# Patient Record
Sex: Female | Born: 1935 | Race: White | Hispanic: No | Marital: Married | Smoking: Former smoker
Health system: Southern US, Community
[De-identification: ages and names within clinical notes are randomized; demographics above are authoritative.]

## PROBLEM LIST (undated history)

## (undated) DIAGNOSIS — Z87891 Personal history of nicotine dependence: Secondary | ICD-10-CM

## (undated) DIAGNOSIS — J449 Chronic obstructive pulmonary disease, unspecified: Secondary | ICD-10-CM

## (undated) DIAGNOSIS — E785 Hyperlipidemia, unspecified: Secondary | ICD-10-CM

## (undated) DIAGNOSIS — R269 Unspecified abnormalities of gait and mobility: Secondary | ICD-10-CM

## (undated) DIAGNOSIS — R413 Other amnesia: Principal | ICD-10-CM

## (undated) DIAGNOSIS — W19XXXA Unspecified fall, initial encounter: Secondary | ICD-10-CM

## (undated) DIAGNOSIS — R296 Repeated falls: Secondary | ICD-10-CM

## (undated) DIAGNOSIS — H409 Unspecified glaucoma: Secondary | ICD-10-CM

## (undated) DIAGNOSIS — I10 Essential (primary) hypertension: Secondary | ICD-10-CM

## (undated) DIAGNOSIS — H9190 Unspecified hearing loss, unspecified ear: Secondary | ICD-10-CM

## (undated) DIAGNOSIS — K222 Esophageal obstruction: Secondary | ICD-10-CM

## (undated) HISTORY — DX: Unspecified abnormalities of gait and mobility: R26.9

## (undated) HISTORY — PX: CATARACT EXTRACTION: SUR2

## (undated) HISTORY — DX: Unspecified hearing loss, unspecified ear: H91.90

## (undated) HISTORY — PX: APPENDECTOMY: SHX54

## (undated) HISTORY — DX: Personal history of nicotine dependence: Z87.891

## (undated) HISTORY — DX: Chronic obstructive pulmonary disease, unspecified: J44.9

## (undated) HISTORY — DX: Unspecified fall, initial encounter: W19.XXXA

## (undated) HISTORY — DX: Esophageal obstruction: K22.2

## (undated) HISTORY — DX: Repeated falls: R29.6

## (undated) HISTORY — DX: Unspecified glaucoma: H40.9

## (undated) HISTORY — DX: Other amnesia: R41.3

## (undated) HISTORY — DX: Essential (primary) hypertension: I10

## (undated) HISTORY — PX: TONSILLECTOMY AND ADENOIDECTOMY: SUR1326

## (undated) HISTORY — DX: Hyperlipidemia, unspecified: E78.5

## (undated) HISTORY — PX: OTHER SURGICAL HISTORY: SHX169

---

## 1998-11-07 ENCOUNTER — Ambulatory Visit (HOSPITAL_COMMUNITY): Admission: RE | Admit: 1998-11-07 | Discharge: 1998-11-07 | Payer: Self-pay | Admitting: Obstetrics and Gynecology

## 1998-11-07 ENCOUNTER — Encounter: Payer: Self-pay | Admitting: Obstetrics and Gynecology

## 2001-12-29 ENCOUNTER — Ambulatory Visit (HOSPITAL_COMMUNITY): Admission: RE | Admit: 2001-12-29 | Discharge: 2001-12-29 | Payer: Self-pay | Admitting: *Deleted

## 2001-12-29 ENCOUNTER — Encounter: Payer: Self-pay | Admitting: *Deleted

## 2004-06-07 ENCOUNTER — Encounter: Admission: RE | Admit: 2004-06-07 | Discharge: 2004-06-07 | Payer: Self-pay | Admitting: Obstetrics and Gynecology

## 2004-06-14 ENCOUNTER — Encounter: Admission: RE | Admit: 2004-06-14 | Discharge: 2004-06-14 | Payer: Self-pay | Admitting: Specialist

## 2010-07-31 ENCOUNTER — Ambulatory Visit: Payer: Self-pay | Admitting: Cardiology

## 2011-01-24 ENCOUNTER — Other Ambulatory Visit: Payer: Self-pay | Admitting: Cardiology

## 2011-01-26 ENCOUNTER — Other Ambulatory Visit: Payer: Self-pay | Admitting: Cardiology

## 2011-01-26 MED ORDER — BISOPROLOL-HYDROCHLOROTHIAZIDE 2.5-6.25 MG PO TABS: ORAL_TABLET | ORAL | Status: DC

## 2011-01-26 NOTE — Telephone Encounter (Signed)
Medication dose was verified (Ziac 2.5-6.25mg  daily) and sent to pharmacy

## 2011-05-11 ENCOUNTER — Other Ambulatory Visit: Payer: Self-pay | Admitting: *Deleted

## 2011-05-11 DIAGNOSIS — F419 Anxiety disorder, unspecified: Secondary | ICD-10-CM

## 2011-05-11 NOTE — Telephone Encounter (Signed)
Refilled meds per fax request. Faxed signed Rx back 

## 2011-05-12 MED ORDER — ALPRAZOLAM 0.25 MG PO TABS: 0.2500 mg | ORAL_TABLET | Freq: Two times a day (BID) | ORAL | Status: AC | PRN

## 2011-08-20 ENCOUNTER — Other Ambulatory Visit: Payer: Self-pay | Admitting: Cardiology

## 2011-08-21 NOTE — Telephone Encounter (Signed)
Pt requesting refill for bisoprolol , uses [facility]

## 2012-02-14 ENCOUNTER — Encounter: Payer: Self-pay | Admitting: *Deleted

## 2012-02-14 DIAGNOSIS — Z87891 Personal history of nicotine dependence: Secondary | ICD-10-CM | POA: Insufficient documentation

## 2013-11-21 ENCOUNTER — Encounter (HOSPITAL_COMMUNITY): Payer: Self-pay | Admitting: Emergency Medicine

## 2013-11-21 ENCOUNTER — Emergency Department (HOSPITAL_COMMUNITY): Payer: Medicare Other

## 2013-11-21 ENCOUNTER — Inpatient Hospital Stay (HOSPITAL_COMMUNITY)
Admission: EM | Admit: 2013-11-21 | Discharge: 2013-11-27 | DRG: 208 | Disposition: A | Discharge status: Skilled Nursing Facility | Payer: Medicare Other | Attending: Pulmonary Disease | Admitting: Pulmonary Disease

## 2013-11-21 DIAGNOSIS — G9340 Encephalopathy, unspecified: Secondary | ICD-10-CM | POA: Diagnosis present

## 2013-11-21 DIAGNOSIS — T17308A Unspecified foreign body in larynx causing other injury, initial encounter: Secondary | ICD-10-CM | POA: Diagnosis present

## 2013-11-21 DIAGNOSIS — B9561 Methicillin susceptible Staphylococcus aureus infection as the cause of diseases classified elsewhere: Secondary | ICD-10-CM

## 2013-11-21 DIAGNOSIS — E785 Hyperlipidemia, unspecified: Secondary | ICD-10-CM | POA: Diagnosis present

## 2013-11-21 DIAGNOSIS — J40 Bronchitis, not specified as acute or chronic: Secondary | ICD-10-CM

## 2013-11-21 DIAGNOSIS — R34 Anuria and oliguria: Secondary | ICD-10-CM | POA: Diagnosis present

## 2013-11-21 DIAGNOSIS — I279 Pulmonary heart disease, unspecified: Secondary | ICD-10-CM | POA: Diagnosis present

## 2013-11-21 DIAGNOSIS — Z87891 Personal history of nicotine dependence: Secondary | ICD-10-CM

## 2013-11-21 DIAGNOSIS — J690 Pneumonitis due to inhalation of food and vomit: Principal | ICD-10-CM | POA: Diagnosis present

## 2013-11-21 DIAGNOSIS — R1310 Dysphagia, unspecified: Secondary | ICD-10-CM | POA: Diagnosis present

## 2013-11-21 DIAGNOSIS — E43 Unspecified severe protein-calorie malnutrition: Secondary | ICD-10-CM | POA: Diagnosis present

## 2013-11-21 DIAGNOSIS — R1319 Other dysphagia: Secondary | ICD-10-CM

## 2013-11-21 DIAGNOSIS — T380X5A Adverse effect of glucocorticoids and synthetic analogues, initial encounter: Secondary | ICD-10-CM | POA: Diagnosis present

## 2013-11-21 DIAGNOSIS — J9819 Other pulmonary collapse: Secondary | ICD-10-CM | POA: Diagnosis present

## 2013-11-21 DIAGNOSIS — R7309 Other abnormal glucose: Secondary | ICD-10-CM | POA: Diagnosis present

## 2013-11-21 DIAGNOSIS — J9692 Respiratory failure, unspecified with hypercapnia: Secondary | ICD-10-CM

## 2013-11-21 DIAGNOSIS — J9620 Acute and chronic respiratory failure, unspecified whether with hypoxia or hypercapnia: Secondary | ICD-10-CM | POA: Diagnosis present

## 2013-11-21 DIAGNOSIS — R5383 Other fatigue: Secondary | ICD-10-CM

## 2013-11-21 DIAGNOSIS — W19XXXA Unspecified fall, initial encounter: Secondary | ICD-10-CM

## 2013-11-21 DIAGNOSIS — I10 Essential (primary) hypertension: Secondary | ICD-10-CM | POA: Diagnosis present

## 2013-11-21 DIAGNOSIS — J9690 Respiratory failure, unspecified, unspecified whether with hypoxia or hypercapnia: Secondary | ICD-10-CM | POA: Diagnosis present

## 2013-11-21 DIAGNOSIS — F17200 Nicotine dependence, unspecified, uncomplicated: Secondary | ICD-10-CM | POA: Diagnosis present

## 2013-11-21 DIAGNOSIS — R031 Nonspecific low blood-pressure reading: Secondary | ICD-10-CM | POA: Diagnosis present

## 2013-11-21 DIAGNOSIS — J441 Chronic obstructive pulmonary disease with (acute) exacerbation: Secondary | ICD-10-CM | POA: Diagnosis present

## 2013-11-21 LAB — COMPREHENSIVE METABOLIC PANEL
ALBUMIN: 3.2 g/dL — AB (ref 3.5–5.2)
ALT: 347 U/L — ABNORMAL HIGH (ref 0–35)
AST: 478 U/L — AB (ref 0–37)
Alkaline Phosphatase: 185 U/L — ABNORMAL HIGH (ref 39–117)
BILIRUBIN TOTAL: 1.1 mg/dL (ref 0.3–1.2)
BUN: 61 mg/dL — ABNORMAL HIGH (ref 6–23)
CHLORIDE: 94 meq/L — AB (ref 96–112)
CO2: 32 mEq/L (ref 19–32)
CREATININE: 1.21 mg/dL — AB (ref 0.50–1.10)
Calcium: 8.4 mg/dL (ref 8.4–10.5)
GFR calc Af Amer: 49 mL/min — ABNORMAL LOW (ref >90)
GFR calc non Af Amer: 42 mL/min — ABNORMAL LOW (ref >90)
Glucose, Bld: 110 mg/dL — ABNORMAL HIGH (ref 70–99)
Potassium: 5.6 mEq/L — ABNORMAL HIGH (ref 3.7–5.3)
SODIUM: 140 meq/L (ref 137–147)
Total Protein: 6.5 g/dL (ref 6.0–8.3)

## 2013-11-21 LAB — MAGNESIUM: Magnesium: 2.6 mg/dL — ABNORMAL HIGH (ref 1.5–2.5)

## 2013-11-21 LAB — RAPID URINE DRUG SCREEN, HOSP PERFORMED
Amphetamines: NOT DETECTED
Barbiturates: NOT DETECTED
Benzodiazepines: POSITIVE — AB
Cocaine: NOT DETECTED
Opiates: NOT DETECTED
TETRAHYDROCANNABINOL: NOT DETECTED

## 2013-11-21 LAB — PHOSPHORUS: PHOSPHORUS: 6.8 mg/dL — AB (ref 2.3–4.6)

## 2013-11-21 LAB — I-STAT ARTERIAL BLOOD GAS, ED
ACID-BASE EXCESS: 4 mmol/L — AB (ref 0.0–2.0)
Bicarbonate: 29.8 mEq/L — ABNORMAL HIGH (ref 20.0–24.0)
O2 SAT: 96 %
PCO2 ART: 43.9 mmHg (ref 35.0–45.0)
PO2 ART: 72 mmHg — AB (ref 80.0–100.0)
Patient temperature: 95.9
TCO2: 31 mmol/L (ref 0–100)
pH, Arterial: 7.434 (ref 7.350–7.450)

## 2013-11-21 LAB — BASIC METABOLIC PANEL
BUN: 62 mg/dL — ABNORMAL HIGH (ref 6–23)
CO2: 31 mEq/L (ref 19–32)
Calcium: 8.4 mg/dL (ref 8.4–10.5)
Chloride: 97 mEq/L (ref 96–112)
Creatinine, Ser: 1.05 mg/dL (ref 0.50–1.10)
GFR calc Af Amer: 58 mL/min — ABNORMAL LOW (ref >90)
GFR calc non Af Amer: 50 mL/min — ABNORMAL LOW (ref >90)
Glucose, Bld: 100 mg/dL — ABNORMAL HIGH (ref 70–99)
Potassium: 4.5 mEq/L (ref 3.7–5.3)
Sodium: 141 mEq/L (ref 137–147)

## 2013-11-21 LAB — MRSA PCR SCREENING: MRSA by PCR: NEGATIVE

## 2013-11-21 LAB — CBC
HCT: 45.5 % (ref 36.0–46.0)
Hemoglobin: 14 g/dL (ref 12.0–15.0)
MCH: 30.1 pg (ref 26.0–34.0)
MCHC: 30.8 g/dL (ref 30.0–36.0)
MCV: 97.8 fL (ref 78.0–100.0)
Platelets: 139 10*3/uL — ABNORMAL LOW (ref 150–400)
RBC: 4.65 MIL/uL (ref 3.87–5.11)
RDW: 14.8 % (ref 11.5–15.5)
WBC: 11.8 10*3/uL — AB (ref 4.0–10.5)

## 2013-11-21 LAB — PROTIME-INR
INR: 1.29 (ref 0.00–1.49)
Prothrombin Time: 15.8 seconds — ABNORMAL HIGH (ref 11.6–15.2)

## 2013-11-21 LAB — ETHANOL

## 2013-11-21 LAB — I-STAT CG4 LACTIC ACID, ED: Lactic Acid, Venous: 1.09 mmol/L (ref 0.5–2.2)

## 2013-11-21 LAB — PROCALCITONIN: Procalcitonin: 0.25 ng/mL

## 2013-11-21 LAB — TROPONIN I: Troponin I: <0.3 ng/mL (ref <0.30)

## 2013-11-21 LAB — APTT: aPTT: 31 seconds (ref 24–37)

## 2013-11-21 LAB — TRIGLYCERIDES

## 2013-11-21 LAB — PRO B NATRIURETIC PEPTIDE: Pro B Natriuretic peptide (BNP): 16993 pg/mL — ABNORMAL HIGH (ref 0–450)

## 2013-11-21 LAB — GLUCOSE, CAPILLARY: Glucose-Capillary: 106 mg/dL — ABNORMAL HIGH (ref 70–99)

## 2013-11-21 LAB — LACTIC ACID, PLASMA: Lactic Acid, Venous: 1.2 mmol/L (ref 0.5–2.2)

## 2013-11-21 MED ORDER — NALOXONE HCL 0.4 MG/ML IJ SOLN
0.4000 mg | Freq: Once | INTRAMUSCULAR | Status: AC
  Administered 2013-11-21: 0.4 mg via INTRAVENOUS
  Filled 2013-11-21: qty 1

## 2013-11-21 MED ORDER — IPRATROPIUM-ALBUTEROL 0.5-2.5 (3) MG/3ML IN SOLN
3.0000 mL | RESPIRATORY_TRACT | Status: DC
  Administered 2013-11-21 – 2013-11-25 (×21): 3 mL via RESPIRATORY_TRACT
  Filled 2013-11-21 (×20): qty 3

## 2013-11-21 MED ORDER — FENTANYL CITRATE 0.05 MG/ML IJ SOLN
100.0000 ug | Freq: Once | INTRAMUSCULAR | Status: AC
  Administered 2013-11-21: 100 ug via INTRAVENOUS
  Filled 2013-11-21: qty 2

## 2013-11-21 MED ORDER — MIDAZOLAM HCL 2 MG/2ML IJ SOLN
2.0000 mg | Freq: Once | INTRAMUSCULAR | Status: AC
  Administered 2013-11-21: 1 mg via INTRAVENOUS
  Filled 2013-11-21: qty 2

## 2013-11-21 MED ORDER — ETOMIDATE 2 MG/ML IV SOLN: 0.3000 mg/kg | Freq: Once | INTRAVENOUS | Status: DC

## 2013-11-21 MED ORDER — LIDOCAINE HCL (CARDIAC) 20 MG/ML IV SOLN
INTRAVENOUS | Status: AC
  Filled 2013-11-21: qty 5

## 2013-11-21 MED ORDER — SODIUM CHLORIDE 0.9 % IV SOLN
INTRAVENOUS | Status: DC
  Administered 2013-11-21: 100 mL/h via INTRAVENOUS
  Administered 2013-11-22 – 2013-11-24 (×2): via INTRAVENOUS

## 2013-11-21 MED ORDER — CHLORHEXIDINE GLUCONATE 0.12 % MT SOLN
OROMUCOSAL | Status: AC
  Filled 2013-11-21: qty 15

## 2013-11-21 MED ORDER — SODIUM CHLORIDE 0.9 % IV SOLN
1.5000 g | Freq: Two times a day (BID) | INTRAVENOUS | Status: DC
  Administered 2013-11-22 – 2013-11-23 (×3): 1.5 g via INTRAVENOUS
  Filled 2013-11-21 (×4): qty 1.5

## 2013-11-21 MED ORDER — ROCURONIUM BROMIDE 50 MG/5ML IV SOLN
50.0000 mg | Freq: Once | INTRAVENOUS | Status: AC
  Administered 2013-11-21: 50 mg via INTRAVENOUS

## 2013-11-21 MED ORDER — PROPOFOL 10 MG/ML IV EMUL
INTRAVENOUS | Status: AC
  Filled 2013-11-21: qty 100

## 2013-11-21 MED ORDER — ETOMIDATE 2 MG/ML IV SOLN
INTRAVENOUS | Status: AC
  Filled 2013-11-21: qty 20

## 2013-11-21 MED ORDER — PROPOFOL 10 MG/ML IV EMUL
0.0000 ug/kg/min | INTRAVENOUS | Status: DC
  Administered 2013-11-21: 5 ug/kg/min via INTRAVENOUS

## 2013-11-21 MED ORDER — HYDRALAZINE HCL 20 MG/ML IJ SOLN: 20.0000 mg | INTRAMUSCULAR | Status: DC | PRN

## 2013-11-21 MED ORDER — ETOMIDATE 2 MG/ML IV SOLN
10.0000 mg | Freq: Once | INTRAVENOUS | Status: AC
  Administered 2013-11-21: 10 mg via INTRAVENOUS

## 2013-11-21 MED ORDER — METHYLPREDNISOLONE SODIUM SUCC 40 MG IJ SOLR
40.0000 mg | Freq: Three times a day (TID) | INTRAMUSCULAR | Status: DC
  Administered 2013-11-21 – 2013-11-24 (×8): 40 mg via INTRAVENOUS
  Filled 2013-11-21 (×11): qty 1

## 2013-11-21 MED ORDER — PANTOPRAZOLE SODIUM 40 MG IV SOLR
40.0000 mg | Freq: Every day | INTRAVENOUS | Status: DC
  Administered 2013-11-21 – 2013-11-23 (×3): 40 mg via INTRAVENOUS
  Filled 2013-11-21 (×4): qty 40

## 2013-11-21 MED ORDER — ROCURONIUM BROMIDE 50 MG/5ML IV SOLN: 1.0000 mg/kg | Freq: Once | INTRAVENOUS | Status: DC

## 2013-11-21 MED ORDER — SODIUM CHLORIDE 0.9 % IV SOLN
3.0000 g | Freq: Once | INTRAVENOUS | Status: AC
  Administered 2013-11-21: 3 g via INTRAVENOUS
  Filled 2013-11-21: qty 3

## 2013-11-21 MED ORDER — SUCCINYLCHOLINE CHLORIDE 20 MG/ML IJ SOLN
INTRAMUSCULAR | Status: AC
Start: 2013-11-21 — End: 2013-11-22
  Filled 2013-11-21: qty 1

## 2013-11-21 MED ORDER — ROCURONIUM BROMIDE 50 MG/5ML IV SOLN
INTRAVENOUS | Status: AC
  Filled 2013-11-21: qty 2

## 2013-11-21 MED ORDER — FENTANYL CITRATE 0.05 MG/ML IJ SOLN
50.0000 ug | INTRAMUSCULAR | Status: DC | PRN
  Administered 2013-11-22 (×3): 50 ug via INTRAVENOUS
  Filled 2013-11-21 (×4): qty 2

## 2013-11-21 MED ORDER — PROPOFOL 10 MG/ML IV EMUL
0.0000 ug/kg/min | INTRAVENOUS | Status: DC
  Administered 2013-11-22 – 2013-11-23 (×2): 20 ug/kg/min via INTRAVENOUS
  Filled 2013-11-21 (×4): qty 100

## 2013-11-21 NOTE — Progress Notes (Addendum)
On review of patient labs from the Emergency Department Potassium was 5.6. Lab placed and redrawn. Repeat was WNL 4.5. [facility] Dr. Alphonsus SiasZUBELEVITSKIY Clete Kuch, Quita SkyeJames D

## 2013-11-21 NOTE — ED Notes (Signed)
Pt returned from CT and placed back on cardiac monitor 

## 2013-11-21 NOTE — ED Notes (Addendum)
Lab at the bedside. EDP talking with family at the bedside

## 2013-11-21 NOTE — ED Notes (Addendum)
Pt O2 dropped to 79% on [place]. Placed on NRB and EDP informed, but not to the bedside. Orders for ABG placed. Family at the bedside. Pt more lethargic at this time, but still opens eyes to verbal stimuli. O2 sat heart rate correlates with EKG cardiac lead rhythm and has a good waveform.

## 2013-11-21 NOTE — ED Notes (Signed)
Dr. Radford PaxBeaton at the bedside to intubate pt.

## 2013-11-21 NOTE — ED Provider Notes (Signed)
INTUBATION Performed by: Jon GillsWebb, Gudelia Eugene  Required items: required blood products, implants, devices, and special equipment available Patient identity confirmed: provided demographic data and hospital-assigned identification number Time out: Immediately prior to procedure a "time out" was called to verify the correct patient, procedure, equipment, support staff and site/side marked as required.  Indications: hypercarbic respiratory failure  Intubation method: Glidescope Laryngoscopy   Preoxygenation: NRB  Sedatives: Etomidate Paralytic: Rocuronium  Tube Size: 7.0 mm cuffed  Tube position: 23 cm, at the teeth  Post-procedure assessment: chest rise and ETCO2 monitor Breath sounds: equal and absent over the epigastrium Tube secured with: ETT holder Chest x-ray interpreted by radiologist and me.  Chest x-ray findings: endotracheal tube in appropriate position  Patient tolerated the procedure well with no immediate complications.   Jon GillsZach Brandan Robicheaux, MD 11/21/13 1650

## 2013-11-21 NOTE — ED Notes (Signed)
Pt to department via EMS- husband reports that pt fell on Tuesday on the ice on her face. Husband reports that she is not acting herself and more lethargic but responds to painful stimuli. Bp-80/40 on EMS arrival then given about 100 ns last BP-115/70 Hr-58 CBG-202. Pt with bruising to the eyes, nose and ears.

## 2013-11-21 NOTE — H&P (Signed)
Name: Laurie Horne MRN: [id number] DOB: 1934-04-24    ADMISSION DATE:  11/21/2013   REFERRING MD :  EDP PRIMARY SERVICE: PCCM  CHIEF COMPLAINT:  VDRF  BRIEF PATIENT DESCRIPTION:  80 yo smoker who fell 4 days prior to admit(2/24) and suffered facial and knee abrasions and bruises.  She presents to ER at [facility] with hypercarbic respiratory failure (7.04/>100/274) and was intubated by the EDP. PCCM asked to admit. Currently on ventilator and sedated.  SIGNIFICANT EVENTS / STUDIES:  2/24 fall  LINES / TUBES: 2/28 OTT>>  CULTURES: 2/28 sputum>> 2/28 bc x 2>> 2/28 UC>>  ANTIBIOTICS: unasyn 2/28 >>  HISTORY OF PRESENT ILLNESS:   History obtained from husband x 60 yrs 41 yo smoker who fell 4 days prior to admit(2/24) and suffered facial and knee abrasions and bruises.  Confusion started on am of admission. She presents to ER at [facility] with hypercarbic respiratory failure (7.04/>100/274) and was intubated by the EDP. PCCM asked to admit. Currently on ventilator and sedated.  PAST MEDICAL HISTORY :  Past Medical History  Diagnosis Date  . Hyperlipidemia   . Hypertension   . History of tobacco abuse    Past Surgical History  Procedure Laterality Date  . Tonsillectomy and adenoidectomy    . Appendectomy    . Childbirth      x 3   Prior to Admission medications   Medication Sig Start Date End Date Taking? Authorizing Provider  ALPRAZolam (XANAX) 0.25 MG tablet Take 0.25 mg by mouth at bedtime as needed.   Yes Historical Provider, MD  aspirin 81 MG tablet Take 81 mg by mouth daily.   Yes Historical Provider, MD  bisoprolol-hydrochlorothiazide [facility]) 2.5-6.25 MG per tablet 1 tablet po daily 01/26/11  Yes Cassell Clement, MD  brinzolamide (AZOPT) 1 % ophthalmic suspension 1 drop. As directed   Yes Historical Provider, MD  meclizine (ANTIVERT) 12.5 MG tablet Take 12.5 mg by mouth 3 (three) times daily as needed for dizziness.   Yes Historical Provider, MD  ramipril (ALTACE) 5 MG  tablet Take 5 mg by mouth daily.   Yes Historical Provider, MD  Travoprost, BAK Free, (TRAVATAN) 0.004 % SOLN ophthalmic solution 1 drop. As directed   Yes Historical Provider, MD   Allergies  Allergen Reactions  . Codeine     FAMILY HISTORY:  History reviewed. No pertinent family history. SOCIAL HISTORY:  reports that she has been smoking Cigarettes.  She has been smoking about 0.25 packs per day. She does not have any smokeless tobacco history on file. Her alcohol and drug histories are not on file.  REVIEW OF SYSTEMS:  NA  SUBJECTIVE:   VITAL SIGNS: Temp:  [95.7 F (35.4 C)-96.3 F (35.7 C)] 96.3 F (35.7 C) (02/28 1506) Pulse Rate:  [58-74] 74 (02/28 1544) Resp:  [16-18] 16 (02/28 1544) BP: (100-121)/(49-58) 103/58 mmHg (02/28 1544) SpO2:  [92 %-93 %] 92 % (02/28 1544) HEMODYNAMICS:   VENTILATOR SETTINGS:   INTAKE / OUTPUT: Intake/Output   None     PHYSICAL EXAMINATION: General:  Disheveled wf with multiple facial abrasions   Neuro:  Sedated on vent HEENT:  PERL 4 mm, +JVD, No LAN, 'racoon eyes', old hematoma on forehead Cardiovascular: HSR RRR Lungs:  Decreased bs thru out Abdomen:  + bs Musculoskeletal:  Bilateral knee abrasions,  Facial trauma and racoon eyes Skin:  Poor tugur  LABS:  CBC No results found for this basename: WBC, HGB, HCT, PLT,  in the last 168 hours  Coag's No results found for this basename: APTT, INR,  in the last 168 hours BMET No results found for this basename: NA, K, CL, CO2, BUN, CREATININE, GLUCOSE,  in the last 168 hours Electrolytes No results found for this basename: CALCIUM, MG, PHOS,  in the last 168 hours Sepsis Markers  Recent Labs Lab 11/21/13 1600  LATICACIDVEN 1.09   ABG No results found for this basename: PHART, PCO2ART, PO2ART,  in the last 168 hours Liver Enzymes No results found for this basename: AST, ALT, ALKPHOS, BILITOT, ALBUMIN,  in the last 168 hours Cardiac Enzymes No results found for this  basename: TROPONINI, PROBNP,  in the last 168 hours Glucose No results found for this basename: GLUCAP,  in the last 168 hours  Imaging Ct Head Wo Contrast  11/21/2013   CLINICAL DATA:  Fall on ice  EXAM: CT HEAD WITHOUT CONTRAST  CT MAXILLOFACIAL WITHOUT CONTRAST  CT CERVICAL SPINE WITHOUT CONTRAST  TECHNIQUE: Multidetector CT imaging of the head, cervical spine, and maxillofacial structures were performed using the standard protocol without intravenous contrast. Multiplanar CT image reconstructions of the cervical spine and maxillofacial structures were also generated.  COMPARISON:  06/14/2004  FINDINGS: CT HEAD FINDINGS  No skull fracture is noted. There is scalp swelling in left frontal region. Subcutaneous scalp hematoma measures 1.5 cm in length. Mild atherosclerotic calcifications of carotid siphon.  No intracranial hemorrhage, mass effect or midline shift. Mild cerebral atrophy. Periventricular and patchy subcortical white matter decreased attenuation probable due to chronic small vessel ischemic changes. No acute cortical infarction. No mass lesion is noted on this unenhanced scan.  CT MAXILLOFACIAL FINDINGS  No nasal bone fracture is noted. There is soft tissue swelling right supraorbital and bilateral frontal region. No nasal bone fracture is noted. No zygomatic fracture is identified.  Coronal images shows right nasal septum deviation. Bilateral semilunar canal is patent. No orbital rim or orbital floor fracture. Metallic dental artifacts are noted.  Motion artifacts are noted. No definite mandibular fracture. No definite TMJ dislocation.  No orbital rim or orbital floor fracture. No intraorbital hematoma. Bilateral eye globe appears intact.  Sagittal images shows patent oropharyngeal airway. No maxillary spine fracture is noted.  CT CERVICAL SPINE FINDINGS  Axial images of the cervical spine shows no acute fracture or subluxation. Computer reconstructed images shows no acute fracture or  subluxation. There is disc space flattening with endplate sclerotic changes C3-C4-C4-C5 and C5-C6 level. Mild anterior and mild posterior spurring at C4-C5 and C5-C6 level. Mild spinal canal stenosis due to posterior spurring at C4-C5 and C5-C6 level. No prevertebral soft tissue swelling. Extensive degenerative changes C1-C2 articulation. Mild pannus formation at C1-C2 level.  The nasopharyngeal and cervical airway is patent.  Axial images shows no pneumothorax in visualized lung apices. Bilateral apical scarring. Mild emphysematous changes right apex.  IMPRESSION: 1. No acute intracranial abnormality. There is soft tissue swelling in frontal scalp. Small subcutaneous hematoma in left frontal scalp. Stable atrophy. There is periventricular and patchy subcortical white matter decreased attenuation probable due to chronic small vessel ischemic changes. 2. No facial fractures are noted. There are metallic dental artifact. There are also motion artifact. No intraorbital hematoma. No orbital rim or orbital floor fracture. Mild soft tissue swelling in frontal region and right supraorbital region. Mild soft tissue swelling in left frontal scalp. 3. No zygomatic fracture. No definite mandibular fracture. No TMJ dislocation. 4. No paranasal sinuses air-fluid levels. 5. No cervical spine acute fracture or subluxation. Multilevel degenerative changes as  described above.   Electronically Signed   By: Natasha Mead M.D.   On: 11/21/2013 15:30   Ct Cervical Spine Wo Contrast  11/21/2013   CLINICAL DATA:  Fall on ice  EXAM: CT HEAD WITHOUT CONTRAST  CT MAXILLOFACIAL WITHOUT CONTRAST  CT CERVICAL SPINE WITHOUT CONTRAST  TECHNIQUE: Multidetector CT imaging of the head, cervical spine, and maxillofacial structures were performed using the standard protocol without intravenous contrast. Multiplanar CT image reconstructions of the cervical spine and maxillofacial structures were also generated.  COMPARISON:  06/14/2004  FINDINGS: CT  HEAD FINDINGS  No skull fracture is noted. There is scalp swelling in left frontal region. Subcutaneous scalp hematoma measures 1.5 cm in length. Mild atherosclerotic calcifications of carotid siphon.  No intracranial hemorrhage, mass effect or midline shift. Mild cerebral atrophy. Periventricular and patchy subcortical white matter decreased attenuation probable due to chronic small vessel ischemic changes. No acute cortical infarction. No mass lesion is noted on this unenhanced scan.  CT MAXILLOFACIAL FINDINGS  No nasal bone fracture is noted. There is soft tissue swelling right supraorbital and bilateral frontal region. No nasal bone fracture is noted. No zygomatic fracture is identified.  Coronal images shows right nasal septum deviation. Bilateral semilunar canal is patent. No orbital rim or orbital floor fracture. Metallic dental artifacts are noted.  Motion artifacts are noted. No definite mandibular fracture. No definite TMJ dislocation.  No orbital rim or orbital floor fracture. No intraorbital hematoma. Bilateral eye globe appears intact.  Sagittal images shows patent oropharyngeal airway. No maxillary spine fracture is noted.  CT CERVICAL SPINE FINDINGS  Axial images of the cervical spine shows no acute fracture or subluxation. Computer reconstructed images shows no acute fracture or subluxation. There is disc space flattening with endplate sclerotic changes C3-C4-C4-C5 and C5-C6 level. Mild anterior and mild posterior spurring at C4-C5 and C5-C6 level. Mild spinal canal stenosis due to posterior spurring at C4-C5 and C5-C6 level. No prevertebral soft tissue swelling. Extensive degenerative changes C1-C2 articulation. Mild pannus formation at C1-C2 level.  The nasopharyngeal and cervical airway is patent.  Axial images shows no pneumothorax in visualized lung apices. Bilateral apical scarring. Mild emphysematous changes right apex.  IMPRESSION: 1. No acute intracranial abnormality. There is soft tissue  swelling in frontal scalp. Small subcutaneous hematoma in left frontal scalp. Stable atrophy. There is periventricular and patchy subcortical white matter decreased attenuation probable due to chronic small vessel ischemic changes. 2. No facial fractures are noted. There are metallic dental artifact. There are also motion artifact. No intraorbital hematoma. No orbital rim or orbital floor fracture. Mild soft tissue swelling in frontal region and right supraorbital region. Mild soft tissue swelling in left frontal scalp. 3. No zygomatic fracture. No definite mandibular fracture. No TMJ dislocation. 4. No paranasal sinuses air-fluid levels. 5. No cervical spine acute fracture or subluxation. Multilevel degenerative changes as described above.   Electronically Signed   By: Natasha Mead M.D.   On: 11/21/2013 15:30   Dg Pelvis Portable  11/21/2013   CLINICAL DATA:  Larey Seat 4 days ago. Patient has altered mental status and was brought inguinal as a trauma. Patient initially refused medical treatment.  EXAM: PORTABLE PELVIS 1-2 VIEWS  COMPARISON:  None.  FINDINGS: No fracture. The hip joints, SI joints and pubic symphysis are normally aligned. There are degenerative changes as well as a levoscoliosis of the lumbar spine. The bones are diffusely demineralized. The soft tissues are unremarkable.  IMPRESSION: No fracture or dislocation or acute finding.  Electronically Signed   By: Amie Portland M.D.   On: 11/21/2013 14:40   Dg Chest Portable 1 View  11/21/2013   CLINICAL DATA:  Larey Seat 4 days ago. Patient has altered mental status and was brought inguinal as a trauma. Patient initially refused medical treatment.  EXAM: PORTABLE CHEST - 1 VIEW  COMPARISON:  None.  FINDINGS: Cardiac silhouette is mildly enlarged. No mediastinal or hilar masses. There are no acute findings in the lungs. No pleural effusion or pneumothorax.  Bony thorax is demineralized but grossly intact.  IMPRESSION: No acute cardiopulmonary disease.    Electronically Signed   By: Amie Portland M.D.   On: 11/21/2013 14:39   Ct Maxillofacial Wo Cm  11/21/2013   CLINICAL DATA:  Fall on ice  EXAM: CT HEAD WITHOUT CONTRAST  CT MAXILLOFACIAL WITHOUT CONTRAST  CT CERVICAL SPINE WITHOUT CONTRAST  TECHNIQUE: Multidetector CT imaging of the head, cervical spine, and maxillofacial structures were performed using the standard protocol without intravenous contrast. Multiplanar CT image reconstructions of the cervical spine and maxillofacial structures were also generated.  COMPARISON:  06/14/2004  FINDINGS: CT HEAD FINDINGS  No skull fracture is noted. There is scalp swelling in left frontal region. Subcutaneous scalp hematoma measures 1.5 cm in length. Mild atherosclerotic calcifications of carotid siphon.  No intracranial hemorrhage, mass effect or midline shift. Mild cerebral atrophy. Periventricular and patchy subcortical white matter decreased attenuation probable due to chronic small vessel ischemic changes. No acute cortical infarction. No mass lesion is noted on this unenhanced scan.  CT MAXILLOFACIAL FINDINGS  No nasal bone fracture is noted. There is soft tissue swelling right supraorbital and bilateral frontal region. No nasal bone fracture is noted. No zygomatic fracture is identified.  Coronal images shows right nasal septum deviation. Bilateral semilunar canal is patent. No orbital rim or orbital floor fracture. Metallic dental artifacts are noted.  Motion artifacts are noted. No definite mandibular fracture. No definite TMJ dislocation.  No orbital rim or orbital floor fracture. No intraorbital hematoma. Bilateral eye globe appears intact.  Sagittal images shows patent oropharyngeal airway. No maxillary spine fracture is noted.  CT CERVICAL SPINE FINDINGS  Axial images of the cervical spine shows no acute fracture or subluxation. Computer reconstructed images shows no acute fracture or subluxation. There is disc space flattening with endplate sclerotic  changes C3-C4-C4-C5 and C5-C6 level. Mild anterior and mild posterior spurring at C4-C5 and C5-C6 level. Mild spinal canal stenosis due to posterior spurring at C4-C5 and C5-C6 level. No prevertebral soft tissue swelling. Extensive degenerative changes C1-C2 articulation. Mild pannus formation at C1-C2 level.  The nasopharyngeal and cervical airway is patent.  Axial images shows no pneumothorax in visualized lung apices. Bilateral apical scarring. Mild emphysematous changes right apex.  IMPRESSION: 1. No acute intracranial abnormality. There is soft tissue swelling in frontal scalp. Small subcutaneous hematoma in left frontal scalp. Stable atrophy. There is periventricular and patchy subcortical white matter decreased attenuation probable due to chronic small vessel ischemic changes. 2. No facial fractures are noted. There are metallic dental artifact. There are also motion artifact. No intraorbital hematoma. No orbital rim or orbital floor fracture. Mild soft tissue swelling in frontal region and right supraorbital region. Mild soft tissue swelling in left frontal scalp. 3. No zygomatic fracture. No definite mandibular fracture. No TMJ dislocation. 4. No paranasal sinuses air-fluid levels. 5. No cervical spine acute fracture or subluxation. Multilevel degenerative changes as described above.   Electronically Signed   By: Lanette Hampshire.D.  On: 11/21/2013 15:30     CXR: See above  ASSESSMENT / PLAN:  PULMONARY A: VDRF, hypercarbia, presumed COPD RUL atelectasis - related to mucus plug post intubation P:   Vent bundle BD's Wean as tolerated Solumedrol 40 q 8h, bronchodilators  CARDIOVASCULAR A: Transient hypotension that responded to fluids      Hx of HTN P:  Monitor VS and check labs  Prn hydralazine Sedate to comfort may alleviate HTN Check bnp and 2 d echo    RENAL A:  No Known issue P:   Check labs  GASTROINTESTINAL A:  GI Protection P:   PPI  HEMATOLOGIC A:  No Known issue P:   Check labs  INFECTIOUS A:  aspn pna P:  Empiric unasyn   ENDOCRINE A:  No Known issue P:   Check labs  NEUROLOGIC A:  Lethargic , post fall, hypercarbia  UDS pos benzos - on xanax P:   Negative CT head and neck 2/28  TODAY'S SUMMARY:   80 yo smoker who fell 4 days prior to admit(2/24) and suffered facial and knee abrasions and bruises.  [facility] hypercarbic respiratory failure (7.04/>100/274) and was intubated by the EDP. PCCM asked to admit. Currently on ventilator and sedated. Discussed advance directives with husband - he would like us to make reasonable interventions, full code for now  I have personally obtained a history, examined the patient, evaluated laboratory and imaging results, formulated the assessment and plan and placed orders. CRITICAL CARE: The patient is critically ill with multiple organ systems failure and requires high complexity decision making for assessment and support, frequent evaluation and titration of therapies, application of advanced monitoring technologies and extensive interpretation of multiple databases. Critical Care Time devoted to patient care services described in this note is  60   minutes.   Oretha MilchALVA,Machaela Caterino V.  Pulmonary and Critical Care Medicine [facility] Pager: [phone]  11/21/2013, 4:18 PM

## 2013-11-21 NOTE — ED Notes (Signed)
Dr. Radford PaxBeaton to to a femoral stick for labs.

## 2013-11-21 NOTE — ED Notes (Addendum)
Warm blankets placed on pt. Finger tips and toes are cyanotic.

## 2013-11-21 NOTE — ED Provider Notes (Signed)
CSN: [id number]     Arrival date & time 11/21/13  1400 History   First MD Initiated Contact with Patient 11/21/13 1402     Chief Complaint  Patient presents with  . Altered Mental Status      HPI Pt to department via EMS- husband reports that pt fell on Tuesday on the ice on her face. Husband reports that she is not acting herself and more lethargic but responds to painful stimuli. Bp-80/40 on EMS arrival then given about 100 ns last BP-115/70 Hr-58 CBG-202  Past Medical History  Diagnosis Date  . Hyperlipidemia   . Hypertension   . History of tobacco abuse    Past Surgical History  Procedure Laterality Date  . Tonsillectomy and adenoidectomy    . Appendectomy    . Childbirth      x 3   History reviewed. No pertinent family history. History  Substance Use Topics  . Smoking status: Current Every Day Smoker -- 0.25 packs/day    Types: Cigarettes  . Smokeless tobacco: Not on file  . Alcohol Use:    OB History   Grav Para Term Preterm Abortions TAB SAB Ect Mult Living                 Review of Systems  Unable to perform ROS: Acuity of condition      Allergies  Codeine  Home Medications   Current Outpatient Rx  Name  Route  Sig  Dispense  Refill  . ALPRAZolam (XANAX) 0.25 MG tablet   Oral   Take 0.25 mg by mouth at bedtime as needed.         Marland Kitchen. aspirin 81 MG tablet   Oral   Take 81 mg by mouth daily.         . bisoprolol-hydrochlorothiazide (ZIAC) 2.5-6.25 MG per tablet      1 tablet po daily   30 tablet   6   . brinzolamide (AZOPT) 1 % ophthalmic suspension      1 drop. As directed         . meclizine (ANTIVERT) 12.5 MG tablet   Oral   Take 12.5 mg by mouth 3 (three) times daily as needed for dizziness.         . ramipril (ALTACE) 5 MG tablet   Oral   Take 5 mg by mouth daily.         . Travoprost, BAK Free, (TRAVATAN) 0.004 % SOLN ophthalmic solution      1 drop. As directed          BP 103/58  Pulse 74  Temp(Src) 96.3 F  (35.7 C) (Core (Comment))  Resp 16  SpO2 92% Physical Exam  Nursing note and vitals reviewed. Constitutional: She appears well-developed. She appears lethargic.  HENT:  Head: Normocephalic. Head is with raccoon's eyes and with contusion.  Eyes: Pupils are equal.  Neck: Normal range of motion.  Cardiovascular: Normal rate and intact distal pulses.   Pulmonary/Chest: No respiratory distress.  Abdominal: Normal appearance. She exhibits no distension.  Musculoskeletal: Normal range of motion.  Neurological: She appears lethargic. No cranial nerve deficit. GCS eye subscore is 2. GCS verbal subscore is 2. GCS motor subscore is 5.  Skin: Skin is warm and dry. Bruising noted. No rash noted.     Psychiatric: She has a normal mood and affect. Her behavior is normal.    ED Course  Procedures (including critical care time) Labs Review    CRITICAL CARE Performed  by: Nelia Shi Total critical care time: 45 min Critical care time was exclusive of separately billable procedures and treating other patients. Critical care was necessary to treat or prevent imminent or life-threatening deterioration. Critical care was time spent personally by me on the following activities: development of treatment plan with patient and/or surrogate as well as nursing, discussions with consultants, evaluation of patient's response to treatment, examination of patient, obtaining history from patient or surrogate, ordering and performing treatments and interventions, ordering and review of laboratory studies, ordering and review of radiographic studies, pulse oximetry and re-evaluation of patient's condition.  Imaging Review   11/21/2013   CLINICAL DATA:  Fall on ice  EXAM: CT HEAD WITHOUT CONTRAST  CT MAXILLOFACIAL WITHOUT CONTRAST  CT CERVICAL SPINE WITHOUT CONTRAST  TECHNIQUE: Multidetector CT imaging of the head, cervical spine, and maxillofacial structures were performed using the standard protocol without  intravenous contrast. Multiplanar CT image reconstructions of the cervical spine and maxillofacial structures were also generated.  COMPARISON:  06/14/2004  FINDINGS:  CT HEAD FINDINGS  No skull fracture is noted. There is scalp swelling in left frontal region. Subcutaneous scalp hematoma measures 1.5 cm in length. Mild atherosclerotic calcifications of carotid siphon.  No intracranial hemorrhage, mass effect or midline shift. Mild cerebral atrophy. Periventricular and patchy subcortical white matter decreased attenuation probable due to chronic small vessel ischemic changes. No acute cortical infarction. No mass lesion is noted on this unenhanced scan.   CT MAXILLOFACIAL FINDINGS  No nasal bone fracture is noted. There is soft tissue swelling right supraorbital and bilateral frontal region. No nasal bone fracture is noted. No zygomatic fracture is identified.  Coronal images shows right nasal septum deviation. Bilateral semilunar canal is patent. No orbital rim or orbital floor fracture. Metallic dental artifacts are noted.  Motion artifacts are noted. No definite mandibular fracture. No definite TMJ dislocation.  No orbital rim or orbital floor fracture. No intraorbital hematoma. Bilateral eye globe appears intact.  Sagittal images shows patent oropharyngeal airway. No maxillary spine fracture is noted.  Axial images of the cervical spine shows no acute fracture or subluxation. Computer reconstructed images shows no acute fracture or subluxation. There is disc space flattening with endplate sclerotic changes C3-C4-C4-C5 and C5-C6 level. Mild anterior and mild posterior spurring at C4-C5 and C5-C6 level. Mild spinal canal stenosis due to posterior spurring at C4-C5 and C5-C6 level. No prevertebral soft tissue swelling. Extensive degenerative changes C1-C2 articulation. Mild pannus formation at C1-C2 level.  The nasopharyngeal and cervical airway is patent.  Axial images shows no pneumothorax in visualized lung  apices. Bilateral apical scarring. Mild emphysematous changes right apex.    IMPRESSION:  1. No acute intracranial abnormality. There is soft tissue swelling in frontal scalp. Small subcutaneous hematoma in left frontal scalp. Stable atrophy. There is periventricular and patchy subcortical white matter decreased attenuation probable due to chronic small vessel ischemic changes.  2. No facial fractures are noted. There are metallic dental artifact. There are also motion artifact. No intraorbital hematoma. No orbital rim or orbital floor fracture. Mild soft tissue swelling in frontal region and right supraorbital region. Mild soft tissue swelling in left frontal scalp.  3. No zygomatic fracture. No definite mandibular fracture. No TMJ dislocation.  4. No paranasal sinuses air-fluid levels.  5. No cervical spine acute fracture or subluxation. Multilevel degenerative changes as described above.   Electronically Signed   By: Natasha Mead M.D.   On: 11/21/2013 15:30   Dg Pelvis Portable  11/21/2013  CLINICAL DATA:  Larey Seat 4 days ago. Patient has altered mental status and was brought inguinal as a trauma. Patient initially refused medical treatment.  EXAM: PORTABLE PELVIS 1-2 VIEWS  COMPARISON:  None.  FINDINGS: No fracture. The hip joints, SI joints and pubic symphysis are normally aligned. There are degenerative changes as well as a levoscoliosis of the lumbar spine. The bones are diffusely demineralized. The soft tissues are unremarkable.   IMPRESSION: No fracture or dislocation or acute finding.   Electronically Signed   By: Amie Portland M.D.   On: 11/21/2013 14:40   Dg Chest Portable 1 View  11/21/2013   CLINICAL DATA:  Larey Seat 4 days ago. Patient has altered mental status and was brought inguinal as a trauma. Patient initially refused medical treatment.  EXAM: PORTABLE CHEST - 1 VIEW  COMPARISON:  None.  FINDINGS: Cardiac silhouette is mildly enlarged. No mediastinal or hilar masses. There are no acute  findings in the lungs. No pleural effusion or pneumothorax.  Bony thorax is demineralized but grossly intact.   MPRESSION: No acute cardiopulmonary disease.    Electronically Signed   By: Amie Portland M.D.   On: 11/21/2013 14:39   Because of respiratory failure the patient required intubation.  Done under my supervision by Dr. Malvin Johns.  (See note)   EKG Interpretation   Date/Time:  Saturday November 21 2013 14:04:56 EST Ventricular Rate:  81 PR Interval:  174 QRS Duration: 90 QT Interval:  408 QTC Calculation: 474 R Axis:   114 Text Interpretation:  Sinus rhythm Normal sinus rhythm Probable left  atrial enlargement Right axis deviation Probable anteroseptal infarct, old  Artifact No previous tracing Confirmed by Berlin Viereck  MD, Sacora Hawbaker ([id number]) on  11/21/2013 2:11:40 PM      CRITICAL CARE Performed by: Nelia Shi Total critical care time: 45 min  Critical care time was exclusive of separately billable procedures and treating other patients. Critical care was necessary to treat or prevent imminent or life-threatening deterioration. Critical care was time spent personally by me on the following activities: development of treatment plan with patient and/or surrogate as well as nursing, discussions with consultants, evaluation of patient's response to treatment, examination of patient, obtaining history from patient or surrogate, ordering and performing treatments and interventions, ordering and review of laboratory studies, ordering and review of radiographic studies, pulse oximetry and re-evaluation of patient's condition.  MDM   DX:  Acute Respiratory Failure      Nelia Shi, MD 11/27/13 2203

## 2013-11-21 NOTE — ED Notes (Signed)
Femoral abg results drawn by dr Radford Paxbeaton were run twice on istat with critical results x2, attempted to download=unable due to above allowed results, (>100 pco2/<7.0 ph)hand delivered to dr beaton=decision to intubate instead of placing pt on bipap

## 2013-11-21 NOTE — ED Notes (Signed)
Pt transported to CT ?

## 2013-11-21 NOTE — ED Notes (Addendum)
RT at the bedside to draw ABG. Sats only remain in at 80-86% with NRB. Dr. Radford PaxBeaton informed of patient status.

## 2013-11-21 NOTE — Progress Notes (Addendum)
ANTIBIOTIC CONSULT NOTE - INITIAL  Pharmacy Consult for unasyn Indication: aspiration pneumonia  Allergies  Allergen Reactions  . Codeine     Patient Measurements:   Adjusted Body Weight:   Vital Signs: Temp: 95.9 F (35.5 C) (02/28 1619) Temp src: Core (Comment) (02/28 1619) BP: 163/101 mmHg (02/28 1715) Pulse Rate: 61 (02/28 1715) Intake/Output from previous day:   Intake/Output from this shift:    Labs:  Recent Labs  11/21/13 1550  WBC 11.8*  HGB 14.0  PLT 139*   CrCl is unknown because no creatinine reading has been taken and the patient has no height on file. No results found for this basename: VANCOTROUGH, VANCOPEAK, VANCORANDOM, GENTTROUGH, GENTPEAK, GENTRANDOM, TOBRATROUGH, TOBRAPEAK, TOBRARND, AMIKACINPEAK, AMIKACINTROU, AMIKACIN,  in the last 72 hours   Microbiology: No results found for this or any previous visit (from the past 720 hour(s)).  Medical History: Past Medical History  Diagnosis Date  . Hyperlipidemia   . Hypertension   . History of tobacco abuse     Medications:  Scheduled:  . etomidate      . ipratropium-albuterol  3 mL Nebulization Q4H  . lidocaine (cardiac) 100 mg/355ml      . methylPREDNISolone (SOLU-MEDROL) injection  40 mg Intravenous 3 times per day  . pantoprazole (PROTONIX) IV  40 mg Intravenous QHS  . rocuronium      . succinylcholine       Infusions:  . sodium chloride 100 mL/hr (11/21/13 1726)  . propofol    . propofol     Assessment: 80 yo female with aspiration pneumonia will be started on unasyn.    Goal of Therapy:  Resolution of infection  Plan:  1) Unasyn 3g iv x1 now and follow up on SCr to assess further dosing  So, Tsz-Yin 11/21/2013,5:42 PM  Addendum: Scr is elevated at 1.21 and pt is very small so CrCl is poor.   Plan: 1. Unasyn 1.5gm IV Q12H 2. F/u renal fxn, C&S, clinical status   Lysle PearlRachel Carollee Nussbaumer, PharmD, BCPS Pager # [phone] 11/21/2013 6:37 PM

## 2013-11-22 ENCOUNTER — Inpatient Hospital Stay (HOSPITAL_COMMUNITY): Payer: Medicare Other

## 2013-11-22 DIAGNOSIS — J9600 Acute respiratory failure, unspecified whether with hypoxia or hypercapnia: Secondary | ICD-10-CM

## 2013-11-22 DIAGNOSIS — J441 Chronic obstructive pulmonary disease with (acute) exacerbation: Secondary | ICD-10-CM

## 2013-11-22 DIAGNOSIS — I519 Heart disease, unspecified: Secondary | ICD-10-CM

## 2013-11-22 LAB — BASIC METABOLIC PANEL
BUN: 55 mg/dL — AB (ref 6–23)
CO2: 27 mEq/L (ref 19–32)
CREATININE: 0.96 mg/dL (ref 0.50–1.10)
Calcium: 8 mg/dL — ABNORMAL LOW (ref 8.4–10.5)
Chloride: 102 mEq/L (ref 96–112)
GFR calc Af Amer: 64 mL/min — ABNORMAL LOW (ref >90)
GFR, EST NON AFRICAN AMERICAN: 56 mL/min — AB (ref >90)
GLUCOSE: 96 mg/dL (ref 70–99)
POTASSIUM: 4.1 meq/L (ref 3.7–5.3)
Sodium: 145 mEq/L (ref 137–147)

## 2013-11-22 LAB — CBC
HEMATOCRIT: 40.6 % (ref 36.0–46.0)
HEMOGLOBIN: 13.3 g/dL (ref 12.0–15.0)
MCH: 29.9 pg (ref 26.0–34.0)
MCHC: 32.8 g/dL (ref 30.0–36.0)
MCV: 91.2 fL (ref 78.0–100.0)
Platelets: 114 10*3/uL — ABNORMAL LOW (ref 150–400)
RBC: 4.45 MIL/uL (ref 3.87–5.11)
RDW: 14.4 % (ref 11.5–15.5)
WBC: 11.7 10*3/uL — ABNORMAL HIGH (ref 4.0–10.5)

## 2013-11-22 LAB — POCT I-STAT 3, ART BLOOD GAS (G3+)
ACID-BASE EXCESS: 2 mmol/L (ref 0.0–2.0)
BICARBONATE: 28.7 meq/L — AB (ref 20.0–24.0)
O2 SAT: 96 %
PCO2 ART: 52.3 mmHg — AB (ref 35.0–45.0)
PO2 ART: 89 mmHg (ref 80.0–100.0)
Patient temperature: 98.3
TCO2: 30 mmol/L (ref 0–100)
pH, Arterial: 7.347 — ABNORMAL LOW (ref 7.350–7.450)

## 2013-11-22 LAB — URINALYSIS, ROUTINE W REFLEX MICROSCOPIC
Bilirubin Urine: NEGATIVE
Glucose, UA: NEGATIVE mg/dL
Ketones, ur: 40 mg/dL — AB
Nitrite: NEGATIVE
Protein, ur: NEGATIVE mg/dL
SPECIFIC GRAVITY, URINE: 1.024 (ref 1.005–1.030)
Urobilinogen, UA: 1 mg/dL (ref 0.0–1.0)
pH: 6 (ref 5.0–8.0)

## 2013-11-22 LAB — TROPONIN I: Troponin I: <0.3 ng/mL (ref <0.30)

## 2013-11-22 LAB — STREP PNEUMONIAE URINARY ANTIGEN: STREP PNEUMO URINARY ANTIGEN: NEGATIVE

## 2013-11-22 LAB — CORTISOL: CORTISOL PLASMA: 21.9 ug/dL

## 2013-11-22 LAB — URINE MICROSCOPIC-ADD ON

## 2013-11-22 MED ORDER — MIDAZOLAM HCL 2 MG/2ML IJ SOLN
1.0000 mg | INTRAMUSCULAR | Status: DC | PRN
  Administered 2013-11-22 (×3): 1 mg via INTRAVENOUS
  Filled 2013-11-22 (×3): qty 2

## 2013-11-22 MED ORDER — BIOTENE DRY MOUTH MT LIQD
15.0000 mL | Freq: Four times a day (QID) | OROMUCOSAL | Status: DC
  Administered 2013-11-22 – 2013-11-23 (×4): 15 mL via OROMUCOSAL

## 2013-11-22 MED ORDER — VITAL HIGH PROTEIN PO LIQD
1000.0000 mL | ORAL | Status: DC
  Administered 2013-11-22: 1000 mL
  Filled 2013-11-22 (×3): qty 1000

## 2013-11-22 MED ORDER — CHLORHEXIDINE GLUCONATE 0.12 % MT SOLN
15.0000 mL | Freq: Two times a day (BID) | OROMUCOSAL | Status: DC
  Administered 2013-11-21: 20:00:00 via OROMUCOSAL
  Administered 2013-11-22 – 2013-11-23 (×2): 15 mL via OROMUCOSAL
  Filled 2013-11-22 (×3): qty 15

## 2013-11-22 NOTE — Progress Notes (Signed)
  Echocardiogram 2D Echocardiogram has been performed.  Laurie Horne, Adriano Bischof 11/22/2013, 11:15 AM

## 2013-11-22 NOTE — Progress Notes (Addendum)
Name: Laurie Horne MRN: [id number] DOB: 09/17/1934    ADMISSION DATE:  11/21/2013   REFERRING MD :  EDP PRIMARY SERVICE: PCCM  CHIEF COMPLAINT:  VDRF  BRIEF PATIENT DESCRIPTION:  80 yo smoker who fell 4 days prior to admit(2/24) and suffered facial and knee abrasions and bruises.  She presents to ER at [facility] with hypercarbic respiratory failure (7.04/>100/274) and was intubated by the EDP. PCCM asked to admit. Currently on ventilator and sedated.  SIGNIFICANT EVENTS / STUDIES:  2/24 fall  LINES / TUBES: 2/28 OTT>>  CULTURES: 2/28 sputum>> 2/28 bc x 2>> 2/28 UC>>  ANTIBIOTICS: unasyn 2/28 >>   SUBJECTIVE: low grade temp Afebrile Sedated on propofol  VITAL SIGNS: Temp:  [94.9 F (34.9 C)-100 F (37.8 C)] 97.5 F (36.4 C) (03/01 0700) Pulse Rate:  [53-94] 82 (03/01 0700) Resp:  [16-25] 18 (03/01 0700) BP: (75-174)/(49-103) 115/61 mmHg (03/01 0700) SpO2:  [84 %-100 %] 100 % (03/01 0700) FiO2 (%):  [40 %-50 %] 40 % (03/01 0335) Weight:  [36.9 kg (81 lb 5.6 oz)-38.9 kg (85 lb 12.1 oz)] 38.9 kg (85 lb 12.1 oz) (03/01 0700) HEMODYNAMICS:   VENTILATOR SETTINGS: Vent Mode:  [-] PRVC FiO2 (%):  [40 %-50 %] 40 % Set Rate:  [18 bmp] 18 bmp Vt Set:  [400 mL] 400 mL PEEP:  [5 cmH20] 5 cmH20 Plateau Pressure:  [18 cmH20-30 cmH20] 18 cmH20 INTAKE / OUTPUT: Intake/Output     02/28 0701 - 03/01 0700 03/01 0701 - 03/02 0700   I.V. (mL/kg) 683 (17.6)    NG/GT 20    Total Intake(mL/kg) 703 (18.1)    Urine (mL/kg/hr) 325    Total Output 325     Net +378            PHYSICAL EXAMINATION: General:  Disheveled wf with multiple facial abrasions   Neuro:  Sedated on vent HEENT:  PERL 4 mm, +JVD, No LAN, 'racoon eyes', old hematoma on forehead Cardiovascular: HSR RRR Lungs:  Decreased bs thru out, peak pr 27 Abdomen:  + bs Musculoskeletal:  Bilateral knee abrasions,  Facial trauma and racoon eyes Skin:  Poor tugur  LABS:  CBC  Recent Labs Lab 11/21/13 1550  11/22/13 0133  WBC 11.8* 11.7*  HGB 14.0 13.3  HCT 45.5 40.6  PLT 139* 114*   Coag's  Recent Labs Lab 11/21/13 1550  APTT 31  INR 1.29   BMET  Recent Labs Lab 11/21/13 1550 11/21/13 2000 11/22/13 0133  NA 140 141 145  K 5.6* 4.5 4.1  CL 94* 97 102  CO2 32 31 27  BUN 61* 62* 55*  CREATININE 1.21* 1.05 0.96  GLUCOSE 110* 100* 96   Electrolytes  Recent Labs Lab 11/21/13 1550 11/21/13 2000 11/22/13 0133  CALCIUM 8.4 8.4 8.0*  MG 2.6*  --   --   PHOS 6.8*  --   --    Sepsis Markers  Recent Labs Lab 11/21/13 1550 11/21/13 1600  LATICACIDVEN 1.2 1.09  PROCALCITON 0.25  --    ABG  Recent Labs Lab 11/21/13 1733 11/22/13 0403  PHART 7.434 7.347*  PCO2ART 43.9 52.3*  PO2ART 72.0* 89.0   Liver Enzymes  Recent Labs Lab 11/21/13 1550  AST 478*  ALT 347*  ALKPHOS 185*  BILITOT 1.1  ALBUMIN 3.2*   Cardiac Enzymes  Recent Labs Lab 11/21/13 1550 11/21/13 2002 11/22/13 0133  TROPONINI <0.30 <0.30 <0.30  PROBNP 16993.0*  --   --    Glucose  Recent Labs Lab 11/21/13 1808  GLUCAP 106*    Imaging Ct Head Wo Contrast  11/21/2013   CLINICAL DATA:  Fall on ice  EXAM: CT HEAD WITHOUT CONTRAST  CT MAXILLOFACIAL WITHOUT CONTRAST  CT CERVICAL SPINE WITHOUT CONTRAST  TECHNIQUE: Multidetector CT imaging of the head, cervical spine, and maxillofacial structures were performed using the standard protocol without intravenous contrast. Multiplanar CT image reconstructions of the cervical spine and maxillofacial structures were also generated.  COMPARISON:  06/14/2004  FINDINGS: CT HEAD FINDINGS  No skull fracture is noted. There is scalp swelling in left frontal region. Subcutaneous scalp hematoma measures 1.5 cm in length. Mild atherosclerotic calcifications of carotid siphon.  No intracranial hemorrhage, mass effect or midline shift. Mild cerebral atrophy. Periventricular and patchy subcortical white matter decreased attenuation probable due to chronic small  vessel ischemic changes. No acute cortical infarction. No mass lesion is noted on this unenhanced scan.  CT MAXILLOFACIAL FINDINGS  No nasal bone fracture is noted. There is soft tissue swelling right supraorbital and bilateral frontal region. No nasal bone fracture is noted. No zygomatic fracture is identified.  Coronal images shows right nasal septum deviation. Bilateral semilunar canal is patent. No orbital rim or orbital floor fracture. Metallic dental artifacts are noted.  Motion artifacts are noted. No definite mandibular fracture. No definite TMJ dislocation.  No orbital rim or orbital floor fracture. No intraorbital hematoma. Bilateral eye globe appears intact.  Sagittal images shows patent oropharyngeal airway. No maxillary spine fracture is noted.  CT CERVICAL SPINE FINDINGS  Axial images of the cervical spine shows no acute fracture or subluxation. Computer reconstructed images shows no acute fracture or subluxation. There is disc space flattening with endplate sclerotic changes C3-C4-C4-C5 and C5-C6 level. Mild anterior and mild posterior spurring at C4-C5 and C5-C6 level. Mild spinal canal stenosis due to posterior spurring at C4-C5 and C5-C6 level. No prevertebral soft tissue swelling. Extensive degenerative changes C1-C2 articulation. Mild pannus formation at C1-C2 level.  The nasopharyngeal and cervical airway is patent.  Axial images shows no pneumothorax in visualized lung apices. Bilateral apical scarring. Mild emphysematous changes right apex.  IMPRESSION: 1. No acute intracranial abnormality. There is soft tissue swelling in frontal scalp. Small subcutaneous hematoma in left frontal scalp. Stable atrophy. There is periventricular and patchy subcortical white matter decreased attenuation probable due to chronic small vessel ischemic changes. 2. No facial fractures are noted. There are metallic dental artifact. There are also motion artifact. No intraorbital hematoma. No orbital rim or orbital  floor fracture. Mild soft tissue swelling in frontal region and right supraorbital region. Mild soft tissue swelling in left frontal scalp. 3. No zygomatic fracture. No definite mandibular fracture. No TMJ dislocation. 4. No paranasal sinuses air-fluid levels. 5. No cervical spine acute fracture or subluxation. Multilevel degenerative changes as described above.   Electronically Signed   By: Natasha Mead M.D.   On: 11/21/2013 15:30   Ct Cervical Spine Wo Contrast  11/21/2013   CLINICAL DATA:  Fall on ice  EXAM: CT HEAD WITHOUT CONTRAST  CT MAXILLOFACIAL WITHOUT CONTRAST  CT CERVICAL SPINE WITHOUT CONTRAST  TECHNIQUE: Multidetector CT imaging of the head, cervical spine, and maxillofacial structures were performed using the standard protocol without intravenous contrast. Multiplanar CT image reconstructions of the cervical spine and maxillofacial structures were also generated.  COMPARISON:  06/14/2004  FINDINGS: CT HEAD FINDINGS  No skull fracture is noted. There is scalp swelling in left frontal region. Subcutaneous scalp hematoma measures 1.5 cm in length.  Mild atherosclerotic calcifications of carotid siphon.  No intracranial hemorrhage, mass effect or midline shift. Mild cerebral atrophy. Periventricular and patchy subcortical white matter decreased attenuation probable due to chronic small vessel ischemic changes. No acute cortical infarction. No mass lesion is noted on this unenhanced scan.  CT MAXILLOFACIAL FINDINGS  No nasal bone fracture is noted. There is soft tissue swelling right supraorbital and bilateral frontal region. No nasal bone fracture is noted. No zygomatic fracture is identified.  Coronal images shows right nasal septum deviation. Bilateral semilunar canal is patent. No orbital rim or orbital floor fracture. Metallic dental artifacts are noted.  Motion artifacts are noted. No definite mandibular fracture. No definite TMJ dislocation.  No orbital rim or orbital floor fracture. No intraorbital  hematoma. Bilateral eye globe appears intact.  Sagittal images shows patent oropharyngeal airway. No maxillary spine fracture is noted.  CT CERVICAL SPINE FINDINGS  Axial images of the cervical spine shows no acute fracture or subluxation. Computer reconstructed images shows no acute fracture or subluxation. There is disc space flattening with endplate sclerotic changes C3-C4-C4-C5 and C5-C6 level. Mild anterior and mild posterior spurring at C4-C5 and C5-C6 level. Mild spinal canal stenosis due to posterior spurring at C4-C5 and C5-C6 level. No prevertebral soft tissue swelling. Extensive degenerative changes C1-C2 articulation. Mild pannus formation at C1-C2 level.  The nasopharyngeal and cervical airway is patent.  Axial images shows no pneumothorax in visualized lung apices. Bilateral apical scarring. Mild emphysematous changes right apex.  IMPRESSION: 1. No acute intracranial abnormality. There is soft tissue swelling in frontal scalp. Small subcutaneous hematoma in left frontal scalp. Stable atrophy. There is periventricular and patchy subcortical white matter decreased attenuation probable due to chronic small vessel ischemic changes. 2. No facial fractures are noted. There are metallic dental artifact. There are also motion artifact. No intraorbital hematoma. No orbital rim or orbital floor fracture. Mild soft tissue swelling in frontal region and right supraorbital region. Mild soft tissue swelling in left frontal scalp. 3. No zygomatic fracture. No definite mandibular fracture. No TMJ dislocation. 4. No paranasal sinuses air-fluid levels. 5. No cervical spine acute fracture or subluxation. Multilevel degenerative changes as described above.   Electronically Signed   By: Natasha Mead M.D.   On: 11/21/2013 15:30   Dg Pelvis Portable  11/21/2013   CLINICAL DATA:  Larey Seat 4 days ago. Patient has altered mental status and was brought inguinal as a trauma. Patient initially refused medical treatment.  EXAM:  PORTABLE PELVIS 1-2 VIEWS  COMPARISON:  None.  FINDINGS: No fracture. The hip joints, SI joints and pubic symphysis are normally aligned. There are degenerative changes as well as a levoscoliosis of the lumbar spine. The bones are diffusely demineralized. The soft tissues are unremarkable.  IMPRESSION: No fracture or dislocation or acute finding.   Electronically Signed   By: Amie Portland M.D.   On: 11/21/2013 14:40   Dg Chest Port 1 View  11/22/2013   CLINICAL DATA:  Intubated  EXAM: PORTABLE CHEST - 1 VIEW  COMPARISON:  the previous day's study  FINDINGS: Endotracheal tube stable in position. A nasogastric tube has been placed at least as far as the stomach, tip not seen. Resolution of the previously noted right upper lobe atelectasis. Lungs appear hyperinflated with attenuated bronchovascular markings in the lung bases. Heart size upper limits normal. Atheromatous aortic arch. . No definite effusion although the lateral costophrenic angles are not visualized. Visualized skeletal structures are unremarkable.  IMPRESSION: 1. Resolution of right upper lobe  atelectasis. 2. Endotracheal tube and nasogastric tube in expected location.   Electronically Signed   By: Oley Balm M.D.   On: 11/22/2013 07:30   Dg Chest Port 1 View  11/21/2013   CLINICAL DATA:  Endotracheal tube placement.  EXAM: PORTABLE CHEST - 1 VIEW  COMPARISON:  Earlier 11/21/2013 14:14 p.m.  FINDINGS: Endotracheal tube has been placed with tip 2.6 cm above the carina. Lungs are adequately inflated as there has been interval volume loss with right upper lobe collapse which may be due to central mucous plugging of the upper lobe bronchus. There is subtle patchy opacification over the left mid to upper lung. There is no effusion. There is mild stable cardiomegaly. Calcified plaque is present over the thoracic aorta. Remainder the exam is unchanged.  IMPRESSION: Interval volume loss with collapse of the right upper lobe which may be due to mucous  plugging. Subtle patchy density over the left mid to upper lung.  Endotracheal tube with tip 2.6 cm above the carina.  These results were called by telephone at the time of interpretation on 11/21/2013 at 4:47 PM to Dr. Jon Gills , who verbally acknowledged these results.   Electronically Signed   By: Elberta Fortis M.D.   On: 11/21/2013 16:44   Dg Chest Portable 1 View  11/21/2013   CLINICAL DATA:  Larey Seat 4 days ago. Patient has altered mental status and was brought inguinal as a trauma. Patient initially refused medical treatment.  EXAM: PORTABLE CHEST - 1 VIEW  COMPARISON:  None.  FINDINGS: Cardiac silhouette is mildly enlarged. No mediastinal or hilar masses. There are no acute findings in the lungs. No pleural effusion or pneumothorax.  Bony thorax is demineralized but grossly intact.  IMPRESSION: No acute cardiopulmonary disease.   Electronically Signed   By: Amie Portland M.D.   On: 11/21/2013 14:39   Ct Maxillofacial Wo Cm  11/21/2013   CLINICAL DATA:  Fall on ice  EXAM: CT HEAD WITHOUT CONTRAST  CT MAXILLOFACIAL WITHOUT CONTRAST  CT CERVICAL SPINE WITHOUT CONTRAST  TECHNIQUE: Multidetector CT imaging of the head, cervical spine, and maxillofacial structures were performed using the standard protocol without intravenous contrast. Multiplanar CT image reconstructions of the cervical spine and maxillofacial structures were also generated.  COMPARISON:  06/14/2004  FINDINGS: CT HEAD FINDINGS  No skull fracture is noted. There is scalp swelling in left frontal region. Subcutaneous scalp hematoma measures 1.5 cm in length. Mild atherosclerotic calcifications of carotid siphon.  No intracranial hemorrhage, mass effect or midline shift. Mild cerebral atrophy. Periventricular and patchy subcortical white matter decreased attenuation probable due to chronic small vessel ischemic changes. No acute cortical infarction. No mass lesion is noted on this unenhanced scan.  CT MAXILLOFACIAL FINDINGS  No nasal bone fracture  is noted. There is soft tissue swelling right supraorbital and bilateral frontal region. No nasal bone fracture is noted. No zygomatic fracture is identified.  Coronal images shows right nasal septum deviation. Bilateral semilunar canal is patent. No orbital rim or orbital floor fracture. Metallic dental artifacts are noted.  Motion artifacts are noted. No definite mandibular fracture. No definite TMJ dislocation.  No orbital rim or orbital floor fracture. No intraorbital hematoma. Bilateral eye globe appears intact.  Sagittal images shows patent oropharyngeal airway. No maxillary spine fracture is noted.  CT CERVICAL SPINE FINDINGS  Axial images of the cervical spine shows no acute fracture or subluxation. Computer reconstructed images shows no acute fracture or subluxation. There is disc space flattening with endplate  sclerotic changes C3-C4-C4-C5 and C5-C6 level. Mild anterior and mild posterior spurring at C4-C5 and C5-C6 level. Mild spinal canal stenosis due to posterior spurring at C4-C5 and C5-C6 level. No prevertebral soft tissue swelling. Extensive degenerative changes C1-C2 articulation. Mild pannus formation at C1-C2 level.  The nasopharyngeal and cervical airway is patent.  Axial images shows no pneumothorax in visualized lung apices. Bilateral apical scarring. Mild emphysematous changes right apex.  IMPRESSION: 1. No acute intracranial abnormality. There is soft tissue swelling in frontal scalp. Small subcutaneous hematoma in left frontal scalp. Stable atrophy. There is periventricular and patchy subcortical white matter decreased attenuation probable due to chronic small vessel ischemic changes. 2. No facial fractures are noted. There are metallic dental artifact. There are also motion artifact. No intraorbital hematoma. No orbital rim or orbital floor fracture. Mild soft tissue swelling in frontal region and right supraorbital region. Mild soft tissue swelling in left frontal scalp. 3. No zygomatic  fracture. No definite mandibular fracture. No TMJ dislocation. 4. No paranasal sinuses air-fluid levels. 5. No cervical spine acute fracture or subluxation. Multilevel degenerative changes as described above.   Electronically Signed   By: Natasha Mead M.D.   On: 11/21/2013 15:30     CXR: See above  ASSESSMENT / PLAN:  PULMONARY A: VDRF, hypercarbia, COPD exacerbation -improving bspasm RUL atelectasis - related to mucus plug post intubation, resolved P:   Vent bundle BD's Wean as tolerated Solumedrol 40 q 8h, bronchodilators  CARDIOVASCULAR A: Transient hypotension that responded to fluids      Hx of HTN, high BNp P:  Monitor VS and check labs  Check  2 d echo    RENAL A:  oliguria P:  follow   GASTROINTESTINAL A:  Protein calorie malnutrition P:   PPI Early TFs  HEMATOLOGIC A:  No Known issue P:  Check labs  INFECTIOUS A:  aspn pna P:  Empiric unasyn, stop once clinical improvement   ENDOCRINE A:  No Known issue P:     NEUROLOGIC A:  Lethargic , post fall, hypercarbia  UDS pos benzos - on xanax P:   Negative CT head and neck 2/28  TODAY'S SUMMARY: SBTs , bspasm improving, hope to extubate eventually Discussed advance directives with husband - he would like [place] to make reasonable interventions, full code for now  I have personally obtained a history, examined the patient, evaluated laboratory and imaging results, formulated the assessment and plan and placed orders. CRITICAL CARE: The patient is critically ill with multiple organ systems failure and requires high complexity decision making for assessment and support, frequent evaluation and titration of therapies, application of advanced monitoring technologies and extensive interpretation of multiple databases. Critical Care Time devoted to patient care services described in this note is 40   minutes.   Oretha Milch  Pulmonary and Critical Care Medicine [facility] Pager: [phone]  11/22/2013, 7:44 AM

## 2013-11-22 NOTE — Progress Notes (Signed)
PULMONARY / CRITICAL CARE MEDICINE  Name: Laurie Horne MRN: [id number] DOB: Feb 20, 1934    ADMISSION DATE:  11/21/2013  REFERRING MD :  EDP PRIMARY SERVICE: PCCM   CHIEF COMPLAINT:  Acute respiratory failure  BRIEF PATIENT DESCRIPTION:  80 yo smoker admitted with hypercarbic respiratory failure requiring intubation.  SIGNIFICANT EVENTS / STUDIES:  3/1  TTE >>> EF 60-65, no RWMA, mildly dilated RV with mildly decreased systolic function, PAP 48 torr  LINES / TUBES: OETT 2/28 >>> OGT 2/28 >>> Foley 2/28 >>>  CULTURES: 2/28  Sputum >>> 2/28  Blood >>> 2/28  Urine >>>  ANTIBIOTICS: Unasyn 2/28 >>  INTERVAL HISTORY:  Transient AF/RVR overnight >>> Amiodarone >>> resolved  VITAL SIGNS: Temp:  [97.1 F (36.2 C)-97.7 F (36.5 C)] 97.5 F (36.4 C) (03/02 0411) Pulse Rate:  [71-139] 85 (03/02 0500) Resp:  [17-27] 18 (03/02 0500) BP: (91-133)/(55-78) 103/55 mmHg (03/02 0500) SpO2:  [99 %-100 %] 100 % (03/02 0500) FiO2 (%):  [30 %-40 %] 40 % (03/02 0341) Weight:  [35.6 kg (78 lb 7.7 oz)] 35.6 kg (78 lb 7.7 oz) (03/02 0300)  HEMODYNAMICS:   VENTILATOR SETTINGS: Vent Mode:  [-] PRVC FiO2 (%):  [30 %-40 %] 40 % Set Rate:  [1 bmp-18 bmp] 18 bmp Vt Set:  [400 mL] 400 mL PEEP:  [5 cmH20] 5 cmH20 Pressure Support:  [15 cmH20] 15 cmH20 Plateau Pressure:  [16 cmH20-21 cmH20] 18 cmH20  INTAKE / OUTPUT: Intake/Output     03/01 0701 - 03/02 0700 03/02 0701 - 03/03 0700   I.V. (mL/kg) 1158.4 (32.5)    NG/GT 500    Total Intake(mL/kg) 1658.4 (46.6)    Urine (mL/kg/hr) 575 (0.7)    Total Output 575     Net +1083.4            PHYSICAL EXAMINATION: General:  Appears acutely ill, mechanically ventilated, synchronous Neuro:  Heavily sedated, nonfocal, cough / gag diminished HEENT:  PERRL, OETT / OGT, multiple facial contusions / abrasions Cardiovascular:  RRR, no m/r/g Lungs:  Bilateral diminished air entry, no w/r/r Abdomen:  Soft, nontender, bowel sounds  diminished Musculoskeletal:  Moves all extremities, no edema Skin:  Poor turgor  LABS:  CBC  Recent Labs Lab 11/21/13 1550 11/22/13 0133 11/23/13 0310  WBC 11.8* 11.7* 18.4*  HGB 14.0 13.3 13.1  HCT 45.5 40.6 41.3  PLT 139* 114* 103*   Coag's  Recent Labs Lab 11/21/13 1550  APTT 31  INR 1.29   BMET  Recent Labs Lab 11/21/13 2000 11/22/13 0133 11/23/13 0310  NA 141 145 140  K 4.5 4.1 4.9  CL 97 102 104  CO2 31 27 26   BUN 62* 55* 35*  CREATININE 1.05 0.96 0.53  GLUCOSE 100* 96 165*   Electrolytes  Recent Labs Lab 11/21/13 1550 11/21/13 2000 11/22/13 0133 11/23/13 0310  CALCIUM 8.4 8.4 8.0* 8.5  MG 2.6*  --   --   --   PHOS 6.8*  --   --   --    Sepsis Markers  Recent Labs Lab 11/21/13 1550 11/21/13 1600  LATICACIDVEN 1.2 1.09  PROCALCITON 0.25  --    ABG  Recent Labs Lab 11/21/13 1733 11/22/13 0403  PHART 7.434 7.347*  PCO2ART 43.9 52.3*  PO2ART 72.0* 89.0   Liver Enzymes  Recent Labs Lab 11/21/13 1550  AST 478*  ALT 347*  ALKPHOS 185*  BILITOT 1.1  ALBUMIN 3.2*   Cardiac Enzymes  Recent Labs Lab 11/21/13 1550 11/21/13  2002 11/22/13 0133  TROPONINI <0.30 <0.30 <0.30  PROBNP 16993.0*  --   --    Glucose  Recent Labs Lab 11/21/13 1808  GLUCAP 106*   IMAGING:  Ct Head Wo Contrast  11/21/2013   CLINICAL DATA:  Fall on ice  EXAM: CT HEAD WITHOUT CONTRAST  CT MAXILLOFACIAL WITHOUT CONTRAST  CT CERVICAL SPINE WITHOUT CONTRAST  TECHNIQUE: Multidetector CT imaging of the head, cervical spine, and maxillofacial structures were performed using the standard protocol without intravenous contrast. Multiplanar CT image reconstructions of the cervical spine and maxillofacial structures were also generated.  COMPARISON:  06/14/2004  FINDINGS: CT HEAD FINDINGS  No skull fracture is noted. There is scalp swelling in left frontal region. Subcutaneous scalp hematoma measures 1.5 cm in length. Mild atherosclerotic calcifications of carotid  siphon.  No intracranial hemorrhage, mass effect or midline shift. Mild cerebral atrophy. Periventricular and patchy subcortical white matter decreased attenuation probable due to chronic small vessel ischemic changes. No acute cortical infarction. No mass lesion is noted on this unenhanced scan.  CT MAXILLOFACIAL FINDINGS  No nasal bone fracture is noted. There is soft tissue swelling right supraorbital and bilateral frontal region. No nasal bone fracture is noted. No zygomatic fracture is identified.  Coronal images shows right nasal septum deviation. Bilateral semilunar canal is patent. No orbital rim or orbital floor fracture. Metallic dental artifacts are noted.  Motion artifacts are noted. No definite mandibular fracture. No definite TMJ dislocation.  No orbital rim or orbital floor fracture. No intraorbital hematoma. Bilateral eye globe appears intact.  Sagittal images shows patent oropharyngeal airway. No maxillary spine fracture is noted.  CT CERVICAL SPINE FINDINGS  Axial images of the cervical spine shows no acute fracture or subluxation. Computer reconstructed images shows no acute fracture or subluxation. There is disc space flattening with endplate sclerotic changes C3-C4-C4-C5 and C5-C6 level. Mild anterior and mild posterior spurring at C4-C5 and C5-C6 level. Mild spinal canal stenosis due to posterior spurring at C4-C5 and C5-C6 level. No prevertebral soft tissue swelling. Extensive degenerative changes C1-C2 articulation. Mild pannus formation at C1-C2 level.  The nasopharyngeal and cervical airway is patent.  Axial images shows no pneumothorax in visualized lung apices. Bilateral apical scarring. Mild emphysematous changes right apex.  IMPRESSION: 1. No acute intracranial abnormality. There is soft tissue swelling in frontal scalp. Small subcutaneous hematoma in left frontal scalp. Stable atrophy. There is periventricular and patchy subcortical white matter decreased attenuation probable due to  chronic small vessel ischemic changes. 2. No facial fractures are noted. There are metallic dental artifact. There are also motion artifact. No intraorbital hematoma. No orbital rim or orbital floor fracture. Mild soft tissue swelling in frontal region and right supraorbital region. Mild soft tissue swelling in left frontal scalp. 3. No zygomatic fracture. No definite mandibular fracture. No TMJ dislocation. 4. No paranasal sinuses air-fluid levels. 5. No cervical spine acute fracture or subluxation. Multilevel degenerative changes as described above.   Electronically Signed   By: Natasha Mead M.D.   On: 11/21/2013 15:30   Ct Cervical Spine Wo Contrast  11/21/2013   CLINICAL DATA:  Fall on ice  EXAM: CT HEAD WITHOUT CONTRAST  CT MAXILLOFACIAL WITHOUT CONTRAST  CT CERVICAL SPINE WITHOUT CONTRAST  TECHNIQUE: Multidetector CT imaging of the head, cervical spine, and maxillofacial structures were performed using the standard protocol without intravenous contrast. Multiplanar CT image reconstructions of the cervical spine and maxillofacial structures were also generated.  COMPARISON:  06/14/2004  FINDINGS: CT HEAD FINDINGS  No skull fracture is noted. There is scalp swelling in left frontal region. Subcutaneous scalp hematoma measures 1.5 cm in length. Mild atherosclerotic calcifications of carotid siphon.  No intracranial hemorrhage, mass effect or midline shift. Mild cerebral atrophy. Periventricular and patchy subcortical white matter decreased attenuation probable due to chronic small vessel ischemic changes. No acute cortical infarction. No mass lesion is noted on this unenhanced scan.  CT MAXILLOFACIAL FINDINGS  No nasal bone fracture is noted. There is soft tissue swelling right supraorbital and bilateral frontal region. No nasal bone fracture is noted. No zygomatic fracture is identified.  Coronal images shows right nasal septum deviation. Bilateral semilunar canal is patent. No orbital rim or orbital floor  fracture. Metallic dental artifacts are noted.  Motion artifacts are noted. No definite mandibular fracture. No definite TMJ dislocation.  No orbital rim or orbital floor fracture. No intraorbital hematoma. Bilateral eye globe appears intact.  Sagittal images shows patent oropharyngeal airway. No maxillary spine fracture is noted.  CT CERVICAL SPINE FINDINGS  Axial images of the cervical spine shows no acute fracture or subluxation. Computer reconstructed images shows no acute fracture or subluxation. There is disc space flattening with endplate sclerotic changes C3-C4-C4-C5 and C5-C6 level. Mild anterior and mild posterior spurring at C4-C5 and C5-C6 level. Mild spinal canal stenosis due to posterior spurring at C4-C5 and C5-C6 level. No prevertebral soft tissue swelling. Extensive degenerative changes C1-C2 articulation. Mild pannus formation at C1-C2 level.  The nasopharyngeal and cervical airway is patent.  Axial images shows no pneumothorax in visualized lung apices. Bilateral apical scarring. Mild emphysematous changes right apex.  IMPRESSION: 1. No acute intracranial abnormality. There is soft tissue swelling in frontal scalp. Small subcutaneous hematoma in left frontal scalp. Stable atrophy. There is periventricular and patchy subcortical white matter decreased attenuation probable due to chronic small vessel ischemic changes. 2. No facial fractures are noted. There are metallic dental artifact. There are also motion artifact. No intraorbital hematoma. No orbital rim or orbital floor fracture. Mild soft tissue swelling in frontal region and right supraorbital region. Mild soft tissue swelling in left frontal scalp. 3. No zygomatic fracture. No definite mandibular fracture. No TMJ dislocation. 4. No paranasal sinuses air-fluid levels. 5. No cervical spine acute fracture or subluxation. Multilevel degenerative changes as described above.   Electronically Signed   By: Natasha Mead M.D.   On: 11/21/2013 15:30    Dg Pelvis Portable  11/21/2013   CLINICAL DATA:  Larey Seat 4 days ago. Patient has altered mental status and was brought inguinal as a trauma. Patient initially refused medical treatment.  EXAM: PORTABLE PELVIS 1-2 VIEWS  COMPARISON:  None.  FINDINGS: No fracture. The hip joints, SI joints and pubic symphysis are normally aligned. There are degenerative changes as well as a levoscoliosis of the lumbar spine. The bones are diffusely demineralized. The soft tissues are unremarkable.  IMPRESSION: No fracture or dislocation or acute finding.   Electronically Signed   By: Amie Portland M.D.   On: 11/21/2013 14:40   Dg Chest Port 1 View  11/23/2013   CLINICAL DATA:  Check endotracheal tube does  EXAM: PORTABLE CHEST - 1 VIEW  COMPARISON:  Chest x-ray from yesterday  FINDINGS: Endotracheal tube ends in the mid thoracic trachea. Enteric tube enters the stomach at least.  Chronic lung disease with hyperinflation and interstitial coarsening. The lungs appear slightly more congested, without overt edema. No consolidation. No effusion or pneumothorax.  IMPRESSION: Endotracheal and orogastric tubes remain in good position.  Electronically Signed   By: Tiburcio Pea M.D.   On: 11/23/2013 06:01   Dg Chest Port 1 View  11/22/2013   CLINICAL DATA:  Intubated  EXAM: PORTABLE CHEST - 1 VIEW  COMPARISON:  the previous day's study  FINDINGS: Endotracheal tube stable in position. A nasogastric tube has been placed at least as far as the stomach, tip not seen. Resolution of the previously noted right upper lobe atelectasis. Lungs appear hyperinflated with attenuated bronchovascular markings in the lung bases. Heart size upper limits normal. Atheromatous aortic arch. . No definite effusion although the lateral costophrenic angles are not visualized. Visualized skeletal structures are unremarkable.  IMPRESSION: 1. Resolution of right upper lobe atelectasis. 2. Endotracheal tube and nasogastric tube in expected location.    Electronically Signed   By: Oley Balm M.D.   On: 11/22/2013 07:30   Dg Chest Port 1 View  11/21/2013   CLINICAL DATA:  Endotracheal tube placement.  EXAM: PORTABLE CHEST - 1 VIEW  COMPARISON:  Earlier 11/21/2013 14:14 p.m.  FINDINGS: Endotracheal tube has been placed with tip 2.6 cm above the carina. Lungs are adequately inflated as there has been interval volume loss with right upper lobe collapse which may be due to central mucous plugging of the upper lobe bronchus. There is subtle patchy opacification over the left mid to upper lung. There is no effusion. There is mild stable cardiomegaly. Calcified plaque is present over the thoracic aorta. Remainder the exam is unchanged.  IMPRESSION: Interval volume loss with collapse of the right upper lobe which may be due to mucous plugging. Subtle patchy density over the left mid to upper lung.  Endotracheal tube with tip 2.6 cm above the carina.  These results were called by telephone at the time of interpretation on 11/21/2013 at 4:47 PM to Dr. Jon Gills , who verbally acknowledged these results.   Electronically Signed   By: Elberta Fortis M.D.   On: 11/21/2013 16:44   Dg Chest Portable 1 View  11/21/2013   CLINICAL DATA:  Larey Seat 4 days ago. Patient has altered mental status and was brought inguinal as a trauma. Patient initially refused medical treatment.  EXAM: PORTABLE CHEST - 1 VIEW  COMPARISON:  None.  FINDINGS: Cardiac silhouette is mildly enlarged. No mediastinal or hilar masses. There are no acute findings in the lungs. No pleural effusion or pneumothorax.  Bony thorax is demineralized but grossly intact.  IMPRESSION: No acute cardiopulmonary disease.   Electronically Signed   By: Amie Portland M.D.   On: 11/21/2013 14:39   Ct Maxillofacial Wo Cm  11/21/2013   CLINICAL DATA:  Fall on ice  EXAM: CT HEAD WITHOUT CONTRAST  CT MAXILLOFACIAL WITHOUT CONTRAST  CT CERVICAL SPINE WITHOUT CONTRAST  TECHNIQUE: Multidetector CT imaging of the head, cervical  spine, and maxillofacial structures were performed using the standard protocol without intravenous contrast. Multiplanar CT image reconstructions of the cervical spine and maxillofacial structures were also generated.  COMPARISON:  06/14/2004  FINDINGS: CT HEAD FINDINGS  No skull fracture is noted. There is scalp swelling in left frontal region. Subcutaneous scalp hematoma measures 1.5 cm in length. Mild atherosclerotic calcifications of carotid siphon.  No intracranial hemorrhage, mass effect or midline shift. Mild cerebral atrophy. Periventricular and patchy subcortical white matter decreased attenuation probable due to chronic small vessel ischemic changes. No acute cortical infarction. No mass lesion is noted on this unenhanced scan.  CT MAXILLOFACIAL FINDINGS  No nasal bone fracture is noted. There is soft tissue  swelling right supraorbital and bilateral frontal region. No nasal bone fracture is noted. No zygomatic fracture is identified.  Coronal images shows right nasal septum deviation. Bilateral semilunar canal is patent. No orbital rim or orbital floor fracture. Metallic dental artifacts are noted.  Motion artifacts are noted. No definite mandibular fracture. No definite TMJ dislocation.  No orbital rim or orbital floor fracture. No intraorbital hematoma. Bilateral eye globe appears intact.  Sagittal images shows patent oropharyngeal airway. No maxillary spine fracture is noted.  CT CERVICAL SPINE FINDINGS  Axial images of the cervical spine shows no acute fracture or subluxation. Computer reconstructed images shows no acute fracture or subluxation. There is disc space flattening with endplate sclerotic changes C3-C4-C4-C5 and C5-C6 level. Mild anterior and mild posterior spurring at C4-C5 and C5-C6 level. Mild spinal canal stenosis due to posterior spurring at C4-C5 and C5-C6 level. No prevertebral soft tissue swelling. Extensive degenerative changes C1-C2 articulation. Mild pannus formation at C1-C2  level.  The nasopharyngeal and cervical airway is patent.  Axial images shows no pneumothorax in visualized lung apices. Bilateral apical scarring. Mild emphysematous changes right apex.  IMPRESSION: 1. No acute intracranial abnormality. There is soft tissue swelling in frontal scalp. Small subcutaneous hematoma in left frontal scalp. Stable atrophy. There is periventricular and patchy subcortical white matter decreased attenuation probable due to chronic small vessel ischemic changes. 2. No facial fractures are noted. There are metallic dental artifact. There are also motion artifact. No intraorbital hematoma. No orbital rim or orbital floor fracture. Mild soft tissue swelling in frontal region and right supraorbital region. Mild soft tissue swelling in left frontal scalp. 3. No zygomatic fracture. No definite mandibular fracture. No TMJ dislocation. 4. No paranasal sinuses air-fluid levels. 5. No cervical spine acute fracture or subluxation. Multilevel degenerative changes as described above.   Electronically Signed   By: Natasha MeadLiviu  Pop M.D.   On: 11/21/2013 15:30   ASSESSMENT / PLAN:  PULMONARY A:  Acute on chronic respiratory failure AECOPD Suspected aspiration pneumonia P:   Goal pH>7.30, SpO2>92 Continuous mechanical support VAP bundle Daily SBT, will attempt to extubate today if meets criteria  Trend ABG/CXR Albuterol / Atrovent Solu-Medrol 40 q8h  CARDIOVASCULAR A:  Elevated BNP Suspected cor pulmonale based on TTE results P:  Hydralazine PRN D/c Amiodarone as no in SR and would avoid in somebody with preexisting lung disease  RENAL A:   No active issues P:   Trend BMP NS@50   GASTROINTESTINAL A:   Protein calorie malnutrition GI Px P:   Hold TF as possible extubation Protonix  HEMATOLOGIC A:   VTE Px P:  Trend CBC SCD  INFECTIOUS A:   Suspected aspiration pneumonia Increasing leukocytosis is likely to systemic steroids P:   Unasyn  ENDOCRINE A:   At risk  for steroid induced hyperglycemia  P:   Start SSI  NEUROLOGIC A:   Acute encephalopaty P:   Goal RASS 0 to -1 Hold Propofol gtt Fentanyl / Versed PRN  I have personally obtained history, examined patient, evaluated and interpreted laboratory and imaging results, reviewed medical records, formulated assessment / plan and placed orders.  CRITICAL CARE:  The patient is critically ill with multiple organ systems failure and requires high complexity decision making for assessment and support, frequent evaluation and titration of therapies, application of advanced monitoring technologies and extensive interpretation of multiple databases. Critical Care Time devoted to patient care services described in this note is 35 minutes.   Laurie Horne, Laurie Vandevoorde, MD Pulmonary and Critical Care  Medicine [employer] Pager: [phone]  11/23/2013, 7:27 AM

## 2013-11-22 NOTE — Progress Notes (Signed)
Sputum sample obtained and sent to lab by RT.  Carlus Stay, RRT, RCP  

## 2013-11-23 ENCOUNTER — Other Ambulatory Visit: Payer: Self-pay

## 2013-11-23 ENCOUNTER — Inpatient Hospital Stay (HOSPITAL_COMMUNITY): Payer: Medicare Other

## 2013-11-23 LAB — URINE CULTURE
CULTURE: NO GROWTH
Colony Count: NO GROWTH

## 2013-11-23 LAB — BASIC METABOLIC PANEL
BUN: 35 mg/dL — ABNORMAL HIGH (ref 6–23)
CO2: 26 mEq/L (ref 19–32)
Calcium: 8.5 mg/dL (ref 8.4–10.5)
Chloride: 104 mEq/L (ref 96–112)
Creatinine, Ser: 0.53 mg/dL (ref 0.50–1.10)
GFR, EST NON AFRICAN AMERICAN: 89 mL/min — AB (ref >90)
Glucose, Bld: 165 mg/dL — ABNORMAL HIGH (ref 70–99)
POTASSIUM: 4.9 meq/L (ref 3.7–5.3)
Sodium: 140 mEq/L (ref 137–147)

## 2013-11-23 LAB — LEGIONELLA ANTIGEN, URINE: Legionella Antigen, Urine: NEGATIVE

## 2013-11-23 LAB — CBC
HCT: 41.3 % (ref 36.0–46.0)
Hemoglobin: 13.1 g/dL (ref 12.0–15.0)
MCH: 29.5 pg (ref 26.0–34.0)
MCHC: 31.7 g/dL (ref 30.0–36.0)
MCV: 93 fL (ref 78.0–100.0)
Platelets: 103 10*3/uL — ABNORMAL LOW (ref 150–400)
RBC: 4.44 MIL/uL (ref 3.87–5.11)
RDW: 15 % (ref 11.5–15.5)
WBC: 18.4 10*3/uL — ABNORMAL HIGH (ref 4.0–10.5)

## 2013-11-23 LAB — GLUCOSE, CAPILLARY
GLUCOSE-CAPILLARY: 170 mg/dL — AB (ref 70–99)
GLUCOSE-CAPILLARY: 88 mg/dL (ref 70–99)
GLUCOSE-CAPILLARY: 115 mg/dL — AB (ref 70–99)
Glucose-Capillary: 148 mg/dL — ABNORMAL HIGH (ref 70–99)

## 2013-11-23 MED ORDER — LEVOFLOXACIN IN D5W 250 MG/50ML IV SOLN
250.0000 mg | INTRAVENOUS | Status: DC
  Administered 2013-11-23 – 2013-11-24 (×2): 250 mg via INTRAVENOUS
  Filled 2013-11-23 (×4): qty 50

## 2013-11-23 MED ORDER — SODIUM CHLORIDE 0.9 % IV SOLN
INTRAVENOUS | Status: DC
  Administered 2013-11-23: 18:00:00 via INTRAVENOUS

## 2013-11-23 MED ORDER — AMIODARONE HCL IN DEXTROSE 360-4.14 MG/200ML-% IV SOLN
30.0000 mg/h | INTRAVENOUS | Status: DC
  Filled 2013-11-23: qty 200

## 2013-11-23 MED ORDER — AMIODARONE HCL IN DEXTROSE 360-4.14 MG/200ML-% IV SOLN
60.0000 mg/h | INTRAVENOUS | Status: DC
  Administered 2013-11-23 (×2): 60 mg/h via INTRAVENOUS
  Filled 2013-11-23: qty 200

## 2013-11-23 MED ORDER — WHITE PETROLATUM GEL
Status: AC
Start: 2013-11-23 — End: 2013-11-24
  Filled 2013-11-23: qty 5

## 2013-11-23 MED ORDER — INSULIN ASPART 100 UNIT/ML ~~LOC~~ SOLN
0.0000 [IU] | SUBCUTANEOUS | Status: DC
  Administered 2013-11-23: 3 [IU] via SUBCUTANEOUS
  Administered 2013-11-23 – 2013-11-24 (×2): 2 [IU] via SUBCUTANEOUS

## 2013-11-23 MED ORDER — AMIODARONE LOAD VIA INFUSION
150.0000 mg | Freq: Once | INTRAVENOUS | Status: AC
  Administered 2013-11-23: 150 mg via INTRAVENOUS
  Filled 2013-11-23: qty 83.34

## 2013-11-23 NOTE — Progress Notes (Signed)
Patient on ABx for AECOPD and possible aspiration.  No definite infiltrate on CXR.  Unasyn on backorder.  Will change to levaquin.  Coralyn HellingVineet Tanique Matney, MD [facility] 11/23/2013, 1:23 PM Pager:  [phone] After 3pm call: [phone]

## 2013-11-23 NOTE — Progress Notes (Signed)
eLink Physician-Brief Progress Note Patient Name: Laurie ChengJoan C Horne DOB: 12/07/1933 MRN: [id number]  Date of Service  11/23/2013   HPI/Events of Note  Height and weight missing.  eICU Interventions  Ordered   Intervention Category Major Interventions: Acid-Base disturbance - evaluation and management;Other:  [name] 11/23/2013, 2:35 AM

## 2013-11-23 NOTE — Progress Notes (Signed)
ANTIBIOTIC CONSULT NOTE - Follow Up  Pharmacy Consult for unasyn >>> Levaquin due to Unasyn shortages Indication: aspiration pneumonia  Assessment: 80 yo female with aspiration pneumonia, COPD exacerbation started on unasyn for antibiotic coverage.  Today we learned that Unasyn is in short supply and we need to change IV antibiotics.  This is a very small female at 35.6 kg and will use closer to pediatric dosing for her of (~ 8mg /kg/day).    Goal of Therapy:  Resolution of infection  Plan:  1) Levaquin 250mg  IV q24 hour 2) F/U for length of therapy  Allergies  Allergen Reactions  . Codeine    Patient Measurements: Weight: 78 lb 7.7 oz (35.6 kg)  Vital Signs: Temp: 97.9 F (36.6 C) (03/02 1200) Temp src: Oral (03/02 1200) BP: 103/55 mmHg (03/02 0500) Pulse Rate: 85 (03/02 0500) Intake/Output from previous day: 03/01 0701 - 03/02 0700 In: 1860.5 [I.V.:1300.5; NG/GT:560] Out: 575 [Urine:575] Intake/Output from this shift: Total I/O In: 140 [I.V.:110; NG/GT:30] Out: 325 [Urine:325]  Labs:  Recent Labs  11/21/13 1550 11/21/13 2000 11/22/13 0133 11/23/13 0310  WBC 11.8*  --  11.7* 18.4*  HGB 14.0  --  13.3 13.1  PLT 139*  --  114* 103*  CREATININE 1.21* 1.05 0.96 0.53   CrCl is unknown because there is no height on file for the current visit.  Microbiology: Recent Results (from the past 720 hour(s))  CULTURE, BLOOD (ROUTINE X 2)     Status: None   Collection Time    11/21/13  7:51 PM      Result Value Ref Range Status   Specimen Description BLOOD RIGHT ARM   Final   Special Requests BOTTLES DRAWN AEROBIC ONLY 2CC   Final   Culture  Setup Time     Final   Value: 11/22/2013 02:58     Performed at [facility]   Culture     Final   Value:        BLOOD CULTURE RECEIVED NO GROWTH TO DATE CULTURE WILL BE HELD FOR 5 DAYS BEFORE ISSUING A FINAL NEGATIVE REPORT     Performed at [facility]   Report Status PENDING   Incomplete  CULTURE, BLOOD  (ROUTINE X 2)     Status: None   Collection Time    11/21/13  8:02 PM      Result Value Ref Range Status   Specimen Description BLOOD RIGHT HAND   Final   Special Requests BOTTLES DRAWN AEROBIC ONLY 1CC   Final   Culture  Setup Time     Final   Value: 11/22/2013 02:58     Performed at [facility]   Culture     Final   Value:        BLOOD CULTURE RECEIVED NO GROWTH TO DATE CULTURE WILL BE HELD FOR 5 DAYS BEFORE ISSUING A FINAL NEGATIVE REPORT     Performed at [facility]   Report Status PENDING   Incomplete  MRSA PCR SCREENING     Status: None   Collection Time    11/21/13  9:45 PM      Result Value Ref Range Status   MRSA by PCR NEGATIVE  NEGATIVE Final   Comment:            The GeneXpert MRSA Assay (FDA     approved for NASAL specimens     only), is one component of a     comprehensive MRSA colonization  surveillance program. It is not     intended to diagnose MRSA     infection nor to guide or     monitor treatment for     MRSA infections.  URINE CULTURE     Status: None   Collection Time    11/22/13  6:10 AM      Result Value Ref Range Status   Specimen Description URINE, CATHETERIZED   Final   Special Requests NONE   Final   Culture  Setup Time     Final   Value: 11/22/2013 15:59     [facility]   Colony Count     Final   Value: NO GROWTH     [facility]formed at [facility]   Culture     Final   Value: NO GROWTH     Performed at [facility]   Report Status 11/23/2013 FINAL   Final  CULTURE, RESPIRATORY (NON-EXPECTORATED)     Status: None   Collection Time    11/22/13  8:58 AM      Result Value Ref Range Status   Specimen Description TRACHEAL ASPIRATE   Final   Special Requests Normal   Final   Gram Stain     Final   Value: RARE WBC PRESENT,BOTH PMN AND MONONUCLEAR     RARE SQUAMOUS EPITHELIAL CELLS PRESENT     RARE GRAM POSITIVE COCCI IN PAIRS     Performed at [facility]   Culture     Final    Value: Culture reincubated for better growth     Performed at [facility]   Report Status PENDING   Incomplete   Medical History: Past Medical History  Diagnosis Date  . Hyperlipidemia   . Hypertension   . History of tobacco abuse    Medications:  Scheduled:  . antiseptic oral rinse  15 mL Mouth Rinse QID  . chlorhexidine  15 mL Mouth Rinse BID  . feeding supplement (VITAL HIGH PROTEIN)  1,000 mL Per Tube Q24H  . insulin aspart  0-15 Units Subcutaneous 6 times per day  . ipratropium-albuterol  3 mL Nebulization Q4H  . levofloxacin (LEVAQUIN) IV  250 mg Intravenous Q24H  . methylPREDNISolone (SOLU-MEDROL) injection  40 mg Intravenous 3 times per day  . pantoprazole (PROTONIX) IV  40 mg Intravenous QHS   Infusions:  . sodium chloride 50 mL/hr at 11/22/13 1100  . sodium chloride    . propofol Stopped (11/23/13 0723)    Laurie Horne, PharmD., MS Clinical Pharmacist Pager:  [phone] Thank you for allowing pharmacy to be part of this patients care team. 11/23/2013,2:52 PM

## 2013-11-23 NOTE — Procedures (Signed)
Extubation Procedure Note  Patient Details:   Name: Wonda ChengJoan C Kemple DOB: 06/03/1934 MRN: [id number]   Airway Documentation:     Evaluation  O2 sats: stable throughout Complications: No apparent complications Patient did tolerate procedure well. Bilateral Breath Sounds: Rhonchi;Diminished Suctioning: Airway Yes BBS equal  Placed on 4l/min [place]   Newt LukesGroendal, Elnor Renovato Ann 11/23/2013, 9:22 AM

## 2013-11-23 NOTE — Progress Notes (Signed)
INITIAL NUTRITION ASSESSMENT  DOCUMENTATION CODES Per approved criteria  -Not Applicable   INTERVENTION: Encourage PO intake RD to continue to monitor and add supplements if needed   NUTRITION DIAGNOSIS: Predicted suboptimal energy intake related to recent respiratory failure as evidenced by  Extubation this morning.   Goal: Pt to meet >/= 90% of their estimated nutrition needs   Monitor:  PO intake, weight, labs  Reason for Assessment: Consult for TF management  80 y.o. female  Admitting Dx: <principal problem not specified>  ASSESSMENT: 80 yo smoker who fell 4 days prior to admit(2/24) and suffered facial and knee abrasions and bruises. [facility] with hypercarbic respiratory failure and was intubated by the EDP.  RD consulted for TF management; however, pt was extubated this morning and diet advanced to Regular this afternoon. Pt eating ice cream and drinking apple juice at time of visit. Pt denies recent weight loss stating she usually weighs 78 lbs; she reports having a good appetite and eating well PTA. When asked her height pt stated that her height is 76. Question appropriateness of pt report. Pt denies use of nutritional supplements PTA. RD to continue to monitor.   Height: Ht Readings from Last 1 Encounters:  No data found for Ht    Weight: Wt Readings from Last 1 Encounters:  11/23/13 78 lb 7.7 oz (35.6 kg)    Ideal Body Weight: unknown, no height on file  % Ideal Body Weight: NA  Wt Readings from Last 10 Encounters:  11/23/13 78 lb 7.7 oz (35.6 kg)    Usual Body Weight: 78 lbs  % Usual Body Weight: 100%  BMI:  There is no height on file to calculate BMI.  Estimated Nutritional Needs: Kcal: 1250-1425 Protein: 50-60 grams Fluid: 1.1 L/day  Skin: abrasions on face and bilateral lower extremities  Diet Order: General  EDUCATION NEEDS: -No education needs identified at this time   Intake/Output Summary (Last 24 hours) at  11/23/13 1429 Last data filed at 11/23/13 1400  Gross per 24 hour  Intake 1604.73 ml  Output    650 ml  Net 954.73 ml    Last BM: PTA  Labs:   Recent Labs Lab 11/21/13 1550 11/21/13 2000 11/22/13 0133 11/23/13 0310  NA 140 141 145 140  K 5.6* 4.5 4.1 4.9  CL 94* 97 102 104  CO2 32 31 27 26   BUN 61* 62* 55* 35*  CREATININE 1.21* 1.05 0.96 0.53  CALCIUM 8.4 8.4 8.0* 8.5  MG 2.6*  --   --   --   PHOS 6.8*  --   --   --   GLUCOSE 110* 100* 96 165*    CBG (last 3)   Recent Labs  11/21/13 1808 11/23/13 0805 11/23/13 1245  GLUCAP 106* 170* 88    Scheduled Meds: . antiseptic oral rinse  15 mL Mouth Rinse QID  . chlorhexidine  15 mL Mouth Rinse BID  . feeding supplement (VITAL HIGH PROTEIN)  1,000 mL Per Tube Q24H  . insulin aspart  0-15 Units Subcutaneous 6 times per day  . ipratropium-albuterol  3 mL Nebulization Q4H  . methylPREDNISolone (SOLU-MEDROL) injection  40 mg Intravenous 3 times per day  . pantoprazole (PROTONIX) IV  40 mg Intravenous QHS    Continuous Infusions: . sodium chloride 50 mL/hr at 11/22/13 1100  . sodium chloride    . propofol Stopped (11/23/13 11910723)    Past Medical History  Diagnosis Date  . Hyperlipidemia   .  Hypertension   . History of tobacco abuse     Past Surgical History  Procedure Laterality Date  . Tonsillectomy and adenoidectomy    . Appendectomy    . Childbirth      x 3    Ian Malkin RD, LDN Inpatient Clinical Dietitian Pager: [phone] After Hours Pager: [phone]

## 2013-11-23 NOTE — Progress Notes (Signed)
eLink Physician-Brief Progress Note Patient Name: Laurie ChengJoan C Horne DOB: 10/23/1933 MRN: [id number]  Date of Service  11/23/2013   HPI/Events of Note   A -fb with RVR and SBP of 90  eICU Interventions  Amiodarone drip started    Intervention Category Major Interventions: Arrhythmia - evaluation and management  YACOUB,WESAM 11/23/2013, 3:01 AM

## 2013-11-24 LAB — BASIC METABOLIC PANEL
BUN: 23 mg/dL (ref 6–23)
CHLORIDE: 105 meq/L (ref 96–112)
CO2: 31 mEq/L (ref 19–32)
Calcium: 8.8 mg/dL (ref 8.4–10.5)
Creatinine, Ser: 0.53 mg/dL (ref 0.50–1.10)
GFR calc Af Amer: >90 mL/min (ref >90)
GFR calc non Af Amer: 89 mL/min — ABNORMAL LOW (ref >90)
Glucose, Bld: 110 mg/dL — ABNORMAL HIGH (ref 70–99)
Potassium: 4.2 mEq/L (ref 3.7–5.3)
SODIUM: 144 meq/L (ref 137–147)

## 2013-11-24 LAB — GLUCOSE, CAPILLARY
GLUCOSE-CAPILLARY: 90 mg/dL (ref 70–99)
Glucose-Capillary: 113 mg/dL — ABNORMAL HIGH (ref 70–99)
Glucose-Capillary: 118 mg/dL — ABNORMAL HIGH (ref 70–99)
Glucose-Capillary: 125 mg/dL — ABNORMAL HIGH (ref 70–99)
Glucose-Capillary: 134 mg/dL — ABNORMAL HIGH (ref 70–99)
Glucose-Capillary: 145 mg/dL — ABNORMAL HIGH (ref 70–99)

## 2013-11-24 LAB — CBC
HCT: 41.5 % (ref 36.0–46.0)
Hemoglobin: 12.9 g/dL (ref 12.0–15.0)
MCH: 29.3 pg (ref 26.0–34.0)
MCHC: 31.1 g/dL (ref 30.0–36.0)
MCV: 94.3 fL (ref 78.0–100.0)
PLATELETS: 108 10*3/uL — AB (ref 150–400)
RBC: 4.4 MIL/uL (ref 3.87–5.11)
RDW: 15.2 % (ref 11.5–15.5)
WBC: 31 10*3/uL — ABNORMAL HIGH (ref 4.0–10.5)

## 2013-11-24 MED ORDER — INSULIN ASPART 100 UNIT/ML ~~LOC~~ SOLN: 0.0000 [IU] | Freq: Every day | SUBCUTANEOUS | Status: DC

## 2013-11-24 MED ORDER — PREDNISONE 50 MG PO TABS
60.0000 mg | ORAL_TABLET | Freq: Every day | ORAL | Status: DC
  Administered 2013-11-25: 60 mg via ORAL
  Filled 2013-11-24 (×2): qty 1

## 2013-11-24 MED ORDER — SODIUM CHLORIDE 0.9 % IV SOLN
500.0000 mg | INTRAVENOUS | Status: DC
  Filled 2013-11-24: qty 500

## 2013-11-24 MED ORDER — VANCOMYCIN HCL IN DEXTROSE 750-5 MG/150ML-% IV SOLN
750.0000 mg | Freq: Once | INTRAVENOUS | Status: AC
  Administered 2013-11-24: 750 mg via INTRAVENOUS
  Filled 2013-11-24: qty 150

## 2013-11-24 MED ORDER — INSULIN ASPART 100 UNIT/ML ~~LOC~~ SOLN
0.0000 [IU] | Freq: Three times a day (TID) | SUBCUTANEOUS | Status: DC
  Administered 2013-11-24 (×2): 2 [IU] via SUBCUTANEOUS

## 2013-11-24 NOTE — Progress Notes (Signed)
PULMONARY / CRITICAL CARE MEDICINE  Name: Laurie ChengJoan C Headley MRN: [id number] DOB: 10/06/1933    ADMISSION DATE:  11/21/2013  REFERRING MD :  EDP PRIMARY SERVICE: PCCM   CHIEF COMPLAINT:  Acute respiratory failure  BRIEF PATIENT DESCRIPTION:  80 yo smoker admitted with hypercarbic respiratory failure requiring intubation.  SIGNIFICANT EVENTS / STUDIES:  3/1  TTE >>> EF 60-65, no RWMA, mildly dilated RV with mildly decreased systolic function, PAP 48 torr  LINES / TUBES: OETT 2/28 >>> 3/2 OGT 2/28 >>> 3/2 Foley 2/28 >>> 3/2  CULTURES: 2/28  Sputum >>>  STAPHYLOCOCCUS AUREUS 2/28  Blood >>> 2/28  Urine >>> neg  ANTIBIOTICS: Unasyn 2/28 >>> 3/2 ( Unasyn shortage ) Levaquin 3/2 >>>  INTERVAL HISTORY:  Remains extubated.  Reports breathing at baseline this am.  VITAL SIGNS: Temp:  [97.5 F (36.4 C)-98.8 F (37.1 C)] 97.7 F (36.5 C) (03/03 0826) Pulse Rate:  [72-98] 73 (03/03 0800) Resp:  [16-37] 30 (03/03 0800) BP: (103-154)/(59-119) 153/69 mmHg (03/03 0800) SpO2:  [84 %-100 %] 93 % (03/03 0801) Weight:  [39.2 kg (86 lb 6.7 oz)] 39.2 kg (86 lb 6.7 oz) (03/03 0430)  HEMODYNAMICS:   VENTILATOR SETTINGS:    INTAKE / OUTPUT: Intake/Output     03/02 0701 - 03/03 0700 03/03 0701 - 03/04 0700   P.O. 1560    I.V. (mL/kg) 455 (11.6)    NG/GT 30    IV Piggyback 50    Total Intake(mL/kg) 2095 (53.4)    Urine (mL/kg/hr) 575 (0.6)    Total Output 575     Net +1520            PHYSICAL EXAMINATION: General:  Sitting in the chair, no distress Neuro:  Awake, alert, tremmor HEENT:  PERRL, face abrasions  Cardiovascular:  RRR, no m/r/g Lungs:  Bilateral diminished air entry, no w/r/r Abdomen:  Soft, nontender, bowel sounds diminished Musculoskeletal:  Moves all extremities, no edema Skin:  Poor turgor  LABS:  CBC  Recent Labs Lab 11/22/13 0133 11/23/13 0310 11/24/13 0240  WBC 11.7* 18.4* 31.0*  HGB 13.3 13.1 12.9  HCT 40.6 41.3 41.5  PLT 114* 103* 108*    Coag's  Recent Labs Lab 11/21/13 1550  APTT 31  INR 1.29   BMET  Recent Labs Lab 11/22/13 0133 11/23/13 0310 11/24/13 0240  NA 145 140 144  K 4.1 4.9 4.2  CL 102 104 105  CO2 27 26 31   BUN 55* 35* 23  CREATININE 0.96 0.53 0.53  GLUCOSE 96 165* 110*   Electrolytes  Recent Labs Lab 11/21/13 1550  11/22/13 0133 11/23/13 0310 11/24/13 0240  CALCIUM 8.4  < > 8.0* 8.5 8.8  MG 2.6*  --   --   --   --   PHOS 6.8*  --   --   --   --   < > = values in this interval not displayed. Sepsis Markers  Recent Labs Lab 11/21/13 1550 11/21/13 1600  LATICACIDVEN 1.2 1.09  PROCALCITON 0.25  --    ABG  Recent Labs Lab 11/21/13 1733 11/22/13 0403  PHART 7.434 7.347*  PCO2ART 43.9 52.3*  PO2ART 72.0* 89.0   Liver Enzymes  Recent Labs Lab 11/21/13 1550  AST 478*  ALT 347*  ALKPHOS 185*  BILITOT 1.1  ALBUMIN 3.2*   Cardiac Enzymes  Recent Labs Lab 11/21/13 1550 11/21/13 2002 11/22/13 0133  TROPONINI <0.30 <0.30 <0.30  PROBNP 16993.0*  --   --    Glucose  Recent Labs Lab 11/23/13 1245 11/23/13 1518 11/23/13 1904 11/23/13 2349 11/24/13 0407 11/24/13 0825  GLUCAP 88 115* 148* 125* 118* 113*   IMAGING:  Dg Chest Port 1 View  11/23/2013   CLINICAL DATA:  Check endotracheal tube does  EXAM: PORTABLE CHEST - 1 VIEW  COMPARISON:  Chest x-ray from yesterday  FINDINGS: Endotracheal tube ends in the mid thoracic trachea. Enteric tube enters the stomach at least.  Chronic lung disease with hyperinflation and interstitial coarsening. The lungs appear slightly more congested, without overt edema. No consolidation. No effusion or pneumothorax.  IMPRESSION: Endotracheal and orogastric tubes remain in good position.   Electronically Signed   By: Tiburcio Pea M.D.   On: 11/23/2013 06:01   ASSESSMENT / PLAN:  PULMONARY A:  Acute on chronic respiratory failure AECOPD Suspected aspiration pneumonia P:   Goal SpO2>92 Supplemental oxygen PRN Albuterol /  Atrovent D/c Solu-Medrol 40 q8h Prednisone 60  CARDIOVASCULAR A:  Elevated BNP Suspected cor pulmonale based on TTE results P:  Hydralazine PRN  RENAL A:   No active issues P:   Trend BMP D/c IVF  GASTROINTESTINAL A:   Protein calorie malnutrition GI Px is not indicated RN reports patient having difficulties chewing / swallowing food P:   Regular diet D/c Protonix SLP eval  HEMATOLOGIC A:   VTE Px P:  Trend CBC SCD  INFECTIOUS A:   AECOPD STAPHYLOCOCCUS AUREUS in sputum Increasing leukocytosis cannot be explained in full by systemic steroids P:   As above Add Vancomycin   ENDOCRINE A:   At risk for steroid induced hyperglycemia  P:   SSI, change to ac&hs  NEUROLOGIC A:   Acute encephalopathy, resolved Severe deconditioning  P:   D/c Versed / Propofol PT/OT  Transfer to telemetry 3/3 on PCCM service.  I have personally obtained history, examined patient, evaluated and interpreted laboratory and imaging results, reviewed medical records, formulated assessment / plan and placed orders.  Lonia Farber, MD Pulmonary and Critical Care Medicine [facility] Pager: [phone]  11/24/2013, 9:53 AM

## 2013-11-24 NOTE — Evaluation (Signed)
Clinical/Bedside Swallow Evaluation Patient Details  Name: Laurie ChengJoan C Arney MRN: [id number] Date of Birth: 07/14/1934  Today's Date: 11/24/2013 Time: 1515-1600 SLP Time Calculation (min): 45 min  Past Medical History:  Past Medical History  Diagnosis Date  . Hyperlipidemia   . Hypertension   . History of tobacco abuse    Past Surgical History:  Past Surgical History  Procedure Laterality Date  . Tonsillectomy and adenoidectomy    . Appendectomy    . Childbirth      x 3   HPI:  80 yo smoker who fell 4 days prior to admit(2/24) and suffered facial and knee abrasions and bruises.  [facility] with hypercarbic respiratory failure and was intubated 2/28-3/2.  Pt's husband reports hx of pt complaining that she can not swallow (will chew food and remove from mouth.) There are no reports of choking, coughing, regurgitation, globus.  His of low weight.  Husband reports two falls: the first six months ago, when pt fell and hit her face/head on concrete pad near front door and the most recent fall.  He describes increased confusion and memory changes subsequent to first fall.  Pt refused medical treatment at that time.   Pt presents with clear cognitive changes that are suggestive of mild TBI.    Assessment / Plan / Recommendation Clinical Impression  Pt presents with no s/s of mechanical swallowing difficulty.  Poor PO intake is chronic but has worsened per her husband.  Pt presents with cognitive decline s/p recent fall.  Husband reports prior fall, hitting head, six months ago.  Pt had been active, volunteering at [facility] and walking several miles daily.  Note PT/OT have been ordered - PLEASE ORDER SLP COGNITIVE EVALUATION.  Pt may require some level of rehabilitation given prior level of independence.         Aspiration Risk  Mild    Diet Recommendation Regular;Thin liquid   Liquid Administration via: Cup;Straw Medication Administration: Whole meds with liquid Supervision:  Patient able to self feed    Other  Recommendations     Follow Up Recommendations   (tba)     General Date of Onset: 11/21/13 Type of Study: Bedside swallow evaluation Previous Swallow Assessment: none  Diet Prior to this Study: Regular;Thin liquids Temperature Spikes Noted: No Respiratory Status: Nasal cannula History of Recent Intubation: Yes Length of Intubations (days): 2 days Date extubated: 11/23/13 Behavior/Cognition: Confused;Distractible;Requires cueing Oral Cavity - Dentition: Adequate natural dentition Self-Feeding Abilities: Able to feed self Patient Positioning: Upright in bed Baseline Vocal Quality: Clear Volitional Cough: Strong Volitional Swallow: Able to elicit    Oral/Motor/Sensory Function Overall Oral Motor/Sensory Function: Appears within functional limits for tasks assessed   Ice Chips Ice chips: Within functional limits Presentation: Spoon   Thin Liquid Thin Liquid: Within functional limits Presentation: Cup    Nectar Thick Nectar Thick Liquid: Not tested   Honey Thick Honey Thick Liquid: Not tested   Puree Puree: Within functional limits   Solid   Bardia Wangerin L. [employer] [place], [place] CCC/SLP Pager [phone]     Solid: Within functional limits       Blenda MountsCouture, Hae Ahlers Laurice 11/24/2013,4:19 PM

## 2013-11-24 NOTE — Progress Notes (Signed)
ANTIBIOTIC CONSULT NOTE - Follow Up  Pharmacy Consult for unasyn >>> Levaquin due to Unasyn shortages Indication: aspiration pneumonia  Assessment: 80 yo female with aspiration pneumonia, COPD exacerbation started on unasyn for antibiotic coverage.  This is a very small female at 35.6 kg and will use weight adjusted dosing for her.  I doubt that while her creatinine is 0.53 this truly indicates her renal function due to her lean body mass.  Leukocytosis is persistent and in part is likely due to steroids.  We have been asked to expand her IV antibiotic regimen to include Vancomycin for a sputum culture with staph aureus.  Goal of Therapy:  Resolution of infection Vancomycin trough of 15-20 mcg/ml  Plan:  1)  Continue IV Levaquin 250mg  IV q24 hour 2)  Add Vancomycin and give 750mg  x1 then begin 500mg  IV every 24 hours 3)  Will check s/s levels and adjust accordingly.   Allergies  Allergen Reactions  . Codeine    Patient Measurements: Weight: 86 lb 6.7 oz (39.2 kg)  Vital Signs: Temp: 97.7 F (36.5 C) (03/03 0826) Temp src: Oral (03/03 0826) BP: 153/69 mmHg (03/03 0800) Pulse Rate: 73 (03/03 0800) Intake/Output from previous day: 03/02 0701 - 03/03 0700 In: 2095 [P.O.:1560; I.V.:455; NG/GT:30; IV Piggyback:50] Out: 575 [Urine:575] Intake/Output from this shift:    Labs:  Recent Labs  11/22/13 0133 11/23/13 0310 11/24/13 0240  WBC 11.7* 18.4* 31.0*  HGB 13.3 13.1 12.9  PLT 114* 103* 108*  CREATININE 0.96 0.53 0.53   CrCl is unknown because there is no height on file for the current visit.  Microbiology: Recent Results (from the past 720 hour(s))  CULTURE, BLOOD (ROUTINE X 2)     Status: None   Collection Time    11/21/13  7:51 PM      Result Value Ref Range Status   Specimen Description BLOOD RIGHT ARM   Final   Special Requests BOTTLES DRAWN AEROBIC ONLY 2CC   Final   Culture  Setup Time     Final   Value: 11/22/2013 02:58     Performed at [facility]   Culture     Final   Value:        BLOOD CULTURE RECEIVED NO GROWTH TO DATE CULTURE WILL BE HELD FOR 5 DAYS BEFORE ISSUING A FINAL NEGATIVE REPORT     Performed at [facility]   Report Status PENDING   Incomplete  CULTURE, BLOOD (ROUTINE X 2)     Status: None   Collection Time    11/21/13  8:02 PM      Result Value Ref Range Status   Specimen Description BLOOD RIGHT HAND   Final   Special Requests BOTTLES DRAWN AEROBIC ONLY 1CC   Final   Culture  Setup Time     Final   Value: 11/22/2013 02:58     Performed at [facility]   Culture     Final   Value:        BLOOD CULTURE RECEIVED NO GROWTH TO DATE CULTURE WILL BE HELD FOR 5 DAYS BEFORE ISSUING A FINAL NEGATIVE REPORT     Performed at [facility]   Report Status PENDING   Incomplete  MRSA PCR SCREENING     Status: None   Collection Time    11/21/13  9:45 PM      Result Value Ref Range Status   MRSA by PCR NEGATIVE  NEGATIVE Final   Comment:  The GeneXpert MRSA Assay (FDA     approved for NASAL specimens     only), is one component of a     comprehensive MRSA colonization     surveillance program. It is not     intended to diagnose MRSA     infection nor to guide or     monitor treatment for     MRSA infections.  URINE CULTURE     Status: None   Collection Time    11/22/13  6:10 AM      Result Value Ref Range Status   Specimen Description URINE, CATHETERIZED   Final   Special Requests NONE   Final   Culture  Setup Time     Final   Value: 11/22/2013 15:59     Performed at [facility]   Colony Count     Final   Value: NO GROWTH     Performed at [facility]   Culture     Final   Value: NO GROWTH     Performed at [facility]   Report Status 11/23/2013 FINAL   Final  CULTURE, RESPIRATORY (NON-EXPECTORATED)     Status: None   Collection Time    11/22/13  8:58 AM      Result Value Ref Range Status   Specimen Description TRACHEAL ASPIRATE   Final    Special Requests Normal   Final   Gram Stain     Final   Value: RARE WBC PRESENT,BOTH PMN AND MONONUCLEAR     RARE SQUAMOUS EPITHELIAL CELLS PRESENT     RARE GRAM POSITIVE COCCI IN PAIRS     Performed at [facility]   Culture     Final   Value: FEW STAPHYLOCOCCUS AUREUS     Note: RIFAMPIN AND GENTAMICIN SHOULD NOT BE USED AS SINGLE DRUGS FOR TREATMENT OF STAPH INFECTIONS.     Performed at [facility]   Report Status PENDING   Incomplete   Medical History: Past Medical History  Diagnosis Date  . Hyperlipidemia   . Hypertension   . History of tobacco abuse    Medications:  Scheduled:  . insulin aspart  0-15 Units Subcutaneous TID WC  . insulin aspart  0-5 Units Subcutaneous QHS  . ipratropium-albuterol  3 mL Nebulization Q4H  . levofloxacin (LEVAQUIN) IV  250 mg Intravenous Q24H  . [START ON 11/25/2013] predniSONE  60 mg Oral Q breakfast   Infusions:  . sodium chloride 10 mL/hr at 11/23/13 1756    Nadara Mustard, PharmD., MS Clinical Pharmacist Pager:  [phone] Thank you for allowing pharmacy to be part of this patients care team. 11/24/2013,10:10 AM

## 2013-11-25 DIAGNOSIS — W19XXXA Unspecified fall, initial encounter: Secondary | ICD-10-CM

## 2013-11-25 LAB — GLUCOSE, CAPILLARY
GLUCOSE-CAPILLARY: 97 mg/dL (ref 70–99)
Glucose-Capillary: 97 mg/dL (ref 70–99)
Glucose-Capillary: 159 mg/dL — ABNORMAL HIGH (ref 70–99)
Glucose-Capillary: 162 mg/dL — ABNORMAL HIGH (ref 70–99)

## 2013-11-25 LAB — CBC
HCT: 40.8 % (ref 36.0–46.0)
Hemoglobin: 12.7 g/dL (ref 12.0–15.0)
MCH: 29.5 pg (ref 26.0–34.0)
MCHC: 31.1 g/dL (ref 30.0–36.0)
MCV: 94.7 fL (ref 78.0–100.0)
Platelets: 105 10*3/uL — ABNORMAL LOW (ref 150–400)
RBC: 4.31 MIL/uL (ref 3.87–5.11)
RDW: 15 % (ref 11.5–15.5)
WBC: 25.9 10*3/uL — ABNORMAL HIGH (ref 4.0–10.5)

## 2013-11-25 LAB — BASIC METABOLIC PANEL
BUN: 13 mg/dL (ref 6–23)
CALCIUM: 8.7 mg/dL (ref 8.4–10.5)
CO2: 32 meq/L (ref 19–32)
CREATININE: 0.49 mg/dL — AB (ref 0.50–1.10)
Chloride: 101 mEq/L (ref 96–112)
GFR calc Af Amer: >90 mL/min (ref >90)
GFR calc non Af Amer: >90 mL/min (ref >90)
Glucose, Bld: 83 mg/dL (ref 70–99)
Potassium: 4.2 mEq/L (ref 3.7–5.3)
Sodium: 140 mEq/L (ref 137–147)

## 2013-11-25 LAB — HEPATIC FUNCTION PANEL
ALK PHOS: 119 U/L — AB (ref 39–117)
ALT: 138 U/L — ABNORMAL HIGH (ref 0–35)
AST: 68 U/L — ABNORMAL HIGH (ref 0–37)
Albumin: 2.4 g/dL — ABNORMAL LOW (ref 3.5–5.2)
BILIRUBIN TOTAL: 0.5 mg/dL (ref 0.3–1.2)
Bilirubin, Direct: <0.2 mg/dL (ref 0.0–0.3)
TOTAL PROTEIN: 4.9 g/dL — AB (ref 6.0–8.3)

## 2013-11-25 LAB — CULTURE, RESPIRATORY W GRAM STAIN

## 2013-11-25 LAB — CULTURE, RESPIRATORY: SPECIAL REQUESTS: NORMAL

## 2013-11-25 MED ORDER — ADULT MULTIVITAMIN W/MINERALS CH
1.0000 | ORAL_TABLET | Freq: Every day | ORAL | Status: DC
  Administered 2013-11-25 – 2013-11-27 (×3): 1 via ORAL
  Filled 2013-11-25 (×3): qty 1

## 2013-11-25 MED ORDER — SODIUM CHLORIDE 0.9 % IV SOLN: INTRAVENOUS | Status: DC | PRN

## 2013-11-25 MED ORDER — ENSURE COMPLETE PO LIQD
237.0000 mL | Freq: Two times a day (BID) | ORAL | Status: DC
  Administered 2013-11-25 – 2013-11-27 (×4): 237 mL via ORAL

## 2013-11-25 MED ORDER — IPRATROPIUM-ALBUTEROL 0.5-2.5 (3) MG/3ML IN SOLN
3.0000 mL | Freq: Four times a day (QID) | RESPIRATORY_TRACT | Status: DC
  Administered 2013-11-25 – 2013-11-27 (×7): 3 mL via RESPIRATORY_TRACT
  Filled 2013-11-25 (×7): qty 3

## 2013-11-25 MED ORDER — LEVOFLOXACIN 250 MG PO TABS
250.0000 mg | ORAL_TABLET | Freq: Every day | ORAL | Status: DC
  Administered 2013-11-25 – 2013-11-27 (×3): 250 mg via ORAL
  Filled 2013-11-25 (×3): qty 1

## 2013-11-25 MED ORDER — PREDNISONE 20 MG PO TABS
20.0000 mg | ORAL_TABLET | Freq: Every day | ORAL | Status: DC
  Administered 2013-11-26 – 2013-11-27 (×2): 20 mg via ORAL
  Filled 2013-11-25 (×3): qty 1

## 2013-11-25 MED ORDER — ALBUTEROL SULFATE (2.5 MG/3ML) 0.083% IN NEBU: 2.5000 mg | INHALATION_SOLUTION | RESPIRATORY_TRACT | Status: DC | PRN

## 2013-11-25 NOTE — Evaluation (Signed)
Occupational Therapy Evaluation Patient Details Name: Laurie Horne MRN: [id number] DOB: 10/09/1933 Today's Date: 11/25/2013 Time: [id number] OT Time Calculation (min): 21 min  OT Assessment / Plan / Recommendation History of present illness Pt admitted with acute hypercarbaric respiratory failure requiring intubation from 11/21/13-11/23/13.  Pt is a current smoker.  She has a hx of falls, most recently 2 weeks ago walking in ice, resulting in multiple facial and knee abrasions.  Pt also found to be protein malnourished.  Family voiced concerns with pt's poor eating and drinking habits prior to admission and resistance to seeking medical care.    Clinical Impression   Pt presents with significant weakness, impaired balance, and decreased activity tolerance interfering with ability to perform ADL and ADL transfers. Cognition requires further assessment.  Will benefit from acute OT and will need ST SNF for rehab prior to return home.     OT Assessment  Patient needs continued OT Services    Follow Up Recommendations  SNF;Supervision/Assistance - 24 hour    Barriers to Discharge      Equipment Recommendations       Recommendations for Other Services    Frequency  Min 2X/week    Precautions / Restrictions Precautions Precautions: Fall   Pertinent Vitals/Pain No pain, VSS.    ADL  Eating/Feeding: Independent Where Assessed - Eating/Feeding: Bed level Grooming: Wash/dry hands;Wash/dry face;Set up Where Assessed - Grooming: Unsupported sitting Upper Body Bathing: Minimal assistance Where Assessed - Upper Body Bathing: Unsupported sitting Lower Body Bathing: Moderate assistance Where Assessed - Lower Body Bathing: Unsupported sitting;Supported sit to stand Upper Body Dressing: Minimal assistance Where Assessed - Upper Body Dressing: Unsupported sitting Lower Body Dressing: Moderate assistance Where Assessed - Lower Body Dressing: Unsupported sitting;Supported sit to stand Toilet  Transfer: Minimal assistance Toilet Transfer Method: Stand pivot Toilet Transfer Equipment: Bedside commode Toileting - Clothing Manipulation and Hygiene: Supervision/safety Where Assessed - Toileting Clothing Manipulation and Hygiene: Sit on 3-in-1 or toilet ADL Comments: Pt leaned down to doff sock and then was too fatigued to redress.    OT Diagnosis: Generalized weakness;Cognitive deficits  OT Problem List: Decreased strength;Decreased activity tolerance;Impaired balance (sitting and/or standing);Decreased cognition;Decreased knowledge of use of DME or AE;Cardiopulmonary status limiting activity OT Treatment Interventions: Self-care/ADL training;Energy conservation;DME and/or AE instruction;Therapeutic activities;Patient/family education;Balance training;Cognitive remediation/compensation   OT Goals(Current goals can be found in the care plan section) Acute Rehab OT Goals Patient Stated Goal: get stronger OT Goal Formulation: With patient Time For Goal Achievement: 12/09/13 Potential to Achieve Goals: Good ADL Goals Pt Will Perform Grooming: with min assist;standing Pt Will Perform Upper Body Bathing: with supervision;sitting Pt Will Perform Lower Body Bathing: with min guard assist;sit to/from stand Pt Will Perform Upper Body Dressing: with supervision;sitting Pt Will Perform Lower Body Dressing: with min guard assist;sit to/from stand Pt Will Transfer to Toilet: with min guard assist;ambulating;regular height toilet Pt Will Perform Toileting - Clothing Manipulation and hygiene: with supervision;sit to/from stand Additional ADL Goal #1: Pt will utilize purse lip breathing techniques and energy conservation strategies during ADL.  Visit Information  Last OT Received On: 11/25/13 Assistance Needed: +1 History of Present Illness: Pt admitted with acute hypercarbaric respiratory failure requiring intubation from 11/21/13-11/23/13.  Pt is a current smoker.  She has a hx of falls, most  recently 2 weeks ago walking in ice, resulting in multiple facial and knee abrasions.  Pt also found to be protein malnourished.  Family voiced concerns with pt's poor eating and drinking habits prior to admission  and resistance to seeking medical care.        Prior Functioning     Home Living Family/patient expects to be discharged to:: Private residence Living Arrangements: Spouse/significant other Available Help at Discharge: Family;Available 24 hours/day Type of Home: House Home Access: Stairs to enter [employer] of Steps: 2 Entrance Stairs-Rails: None Home Layout: Two level Alternate Level Stairs-Number of Steps: 14 Alternate Level Stairs-Rails: Left Home Equipment: Crutches Prior Function Level of Independence: Independent Comments: Pt does little cooking and has a hx of poor eating and drinking habits. Drives and gets groceries, [place] several blocks daily. Communication Communication: No difficulties Dominant Hand: Left         Vision/Perception Vision - History Baseline Vision: Wears glasses only for reading Patient Visual Report: No change from baseline   Cognition  Cognition Arousal/Alertness: Awake/alert Behavior During Therapy: Flat affect Overall Cognitive Status: Within Functional Limits for tasks assessed (per pt report, pt c/o memory loss)    Extremity/Trunk Assessment Upper Extremity Assessment Upper Extremity Assessment: Generalized weakness Lower Extremity Assessment Lower Extremity Assessment: Defer to PT evaluation Cervical / Trunk Assessment Cervical / Trunk Assessment:  (forward head, but able to sit and stand tall with cues)     Mobility Bed Mobility Overal bed mobility: Needs Assistance Bed Mobility: Supine to Sit;Sit to Supine Supine to sit: Min assist Sit to supine: Supervision General bed mobility comments: used rail, assist to right trunk, requires extra time Transfers Overall transfer level: Needs assistance Transfers:  Sit to/from Stand Sit to Stand: Min assist Stand pivot transfers: Min assist General transfer comment: both hands held     Exercise     Balance Balance Overall balance assessment: Needs assistance Sitting-balance support: Feet supported Sitting balance-Leahy Scale: Fair Standing balance support: Bilateral upper extremity supported Standing balance-Leahy Scale: Poor   End of Session OT - End of Session Activity Tolerance: Patient limited by fatigue Patient left: in bed;with call bell/phone within reach;with bed alarm set  GO     Evern Bio 11/25/2013, 2:11 PM [phone]

## 2013-11-25 NOTE — Evaluation (Signed)
Physical Therapy Evaluation Patient Details Name: Laurie ChengJoan C Horne MRN: [id number] DOB: 07/05/1934 Today's Date: 11/25/2013 Time: [id number] PT Time Calculation (min): 28 min  PT Assessment / Plan / Recommendation History of Present Illness  Pt admitted with acute hypercarbaric respiratory failure requiring intubation from 11/21/13-11/23/13.  Pt is a current smoker.  She has a hx of falls, most recently 2 weeks ago walking in ice, resulting in multiple facial and knee abrasions.  Pt also found to be protein malnourished.  Family voiced concerns with pt's poor eating and drinking habits prior to admission and resistance to seeking medical care.   Clinical Impression  Pt admitted with/for respiratory failure.  Pt currently limited functionally due to the problems listed. ( See problems list.)   Pt will benefit from PT to maximize function and safety in order to get ready for next venue listed below.     PT Assessment  Patient needs continued PT services    Follow Up Recommendations  SNF    Does the patient have the potential to tolerate intense rehabilitation      Barriers to Discharge        Equipment Recommendations       Recommendations for Other Services     Frequency Min 3X/week    Precautions / Restrictions Precautions Precautions: Fall Restrictions Weight Bearing Restrictions: No   Pertinent Vitals/Pain sats at rest on 2L [place] in low 80's at EOB,  sats on 4L [place] never got higher than 85% during session HR in th 110's and dropped into the mid 70's during gait.  After returning 10 minutes later after session sats running at 89% on 4 L [place]  HR in the 90/100's      Mobility  Bed Mobility Overal bed mobility: Needs Assistance Bed Mobility: Supine to Sit;Sit to Supine Supine to sit: Min assist Sit to supine: Supervision General bed mobility comments: heavy use of rail,   truncal assist Transfers Overall transfer level: Needs assistance Equipment used: 1 person hand held  assist Transfers: Sit to/from Stand Sit to Stand: Min assist General transfer comment: lifting/steadying assist Ambulation/Gait Ambulation/Gait assistance: Min assist Ambulation Distance (Feet): 25 Feet Assistive device: 1 person hand held assist Gait Pattern/deviations: Step-through pattern;Scissoring;Narrow base of support (weak-kneed) General Gait Details: Generally unsteady, weak-kneed gait    Exercises     PT Diagnosis: Difficulty walking;Generalized weakness  PT Problem List: Decreased strength;Decreased activity tolerance;Decreased balance;Decreased mobility;Decreased cognition;Decreased knowledge of precautions;Cardiopulmonary status limiting activity PT Treatment Interventions: DME instruction;Gait training;Stair training;Functional mobility training;Therapeutic activities;Balance training;Patient/family education     PT Goals(Current goals can be found in the care plan section) Acute Rehab PT Goals Patient Stated Goal: get stronger PT Goal Formulation: With patient Time For Goal Achievement: 12/09/13 Potential to Achieve Goals: Good  Visit Information  Last PT Received On: 11/25/13 Assistance Needed: +1 History of Present Illness: Pt admitted with acute hypercarbaric respiratory failure requiring intubation from 11/21/13-11/23/13.  Pt is a current smoker.  She has a hx of falls, most recently 2 weeks ago walking in ice, resulting in multiple facial and knee abrasions.  Pt also found to be protein malnourished.  Family voiced concerns with pt's poor eating and drinking habits prior to admission and resistance to seeking medical care.        Prior Functioning  Home Living Family/patient expects to be discharged to:: Private residence Living Arrangements: Spouse/significant other Available Help at Discharge: Family Type of Home: House Home Access: Stairs to enter [employer] CorporationEntrance Stairs-Number of Steps: 2 Entrance  Stairs-Rails: None Home Layout: Two level Alternate Level  Stairs-Number of Steps: 14 Alternate Level Stairs-Rails: Left Home Equipment: Crutches  Lives With: Spouse Prior Function Level of Independence: Independent Comments: Pt does little cooking and has a hx of poor eating and drinking habits. Drives and gets groceries, [place] several blocks daily. Communication Communication: No difficulties Dominant Hand: Left    Cognition  Cognition Arousal/Alertness: Awake/alert Behavior During Therapy: Flat affect Overall Cognitive Status: Impaired/Different from baseline    Extremity/Trunk Assessment Lower Extremity Assessment Lower Extremity Assessment: Generalized weakness (grossly 3+/5)   Balance Balance Overall balance assessment: Needs assistance Sitting-balance support: No upper extremity supported Sitting balance-Leahy Scale: Fair Standing balance support: Single extremity supported Standing balance-Leahy Scale: Poor  End of Session PT - End of Session Activity Tolerance: Patient limited by fatigue Patient left: in chair;with call bell/phone within reach;with family/visitor present Nurse Communication: Mobility status  GP     Rosealyn Little, Eliseo Gum 11/25/2013, 4:51 PM 11/25/2013  [place] Bing, PT [phone]  (pager)

## 2013-11-25 NOTE — Progress Notes (Signed)
NUTRITION CONSULT/FOLLOW UP  DOCUMENTATION CODES Per approved criteria  -Severe malnutrition in the context of chronic illness -Underweight   Intervention: Ensure Complete po BID, each supplement provides 350 kcal and 13 grams of protein Multivitamin with minerals daily RD to follow for nutrition care plan  New Nutrition Dx: Increased nutrient needs related to malnutrition as evidenced by estimated nutrition needs, ongoing  Goal: Pt to meet >/= 90% of their estimated nutrition needs, progressing  Monitor:  PO & supplemental intake, weight, labs, I/O's  ASSESSMENT: 80 yo smoker who fell 4 days prior to admit(2/24) and suffered facial and knee abrasions and bruises. She presents to ER at [facility] with hypercarbic respiratory failure and was intubated by the EDP.  Initial nutrition assessment completed 11/23/13.  Patient extubated 3/2.  Currently having breathing treatment, eyes are closed.  S/p bedside swallow evaluation 3/3.  SLP recommending Regular, thin liquid textures.  PO intake 50-75% per flowsheet records.  + severe muscle & fat loss to upper body.  Would benefit from oral nutrition supplements.  Nutrition Focused Physical Exam:  Subcutaneous Fat:  Orbital Region: severe depletion Upper Arm Region: severe depletion Thoracic and Lumbar Region: N/A  Muscle:  Temple Region: moderate depletion Clavicle Bone Region: severe depletion Clavicle and Acromion Bone Region: severe depletion Scapular Bone Region: N/A Dorsal Hand: N/A Patellar Region: N/A Anterior Thigh Region: N/A Posterior Calf Region: N/A  Edema: none  Patient meets criteria for severe malnutrition in the context of chronic illness as evidenced by severe muscle loss and severe subcutaneous fat loss.  Height: Ht Readings from Last 1 Encounters:  11/24/13 5\' 4"  (1.626 m)    Weight: Wt Readings from Last 1 Encounters:  11/25/13 91 lb 9.6 oz (41.549 kg)    BMI:  Body mass index is 15.72  kg/(m^2).  Estimated Nutritional Needs: Kcal: 1250-1425 Protein: 50-60 grams Fluid: 1.1 L/day  Skin: abrasions on face and bilateral lower extremities  Diet Order: Carb Control    Intake/Output Summary (Last 24 hours) at 11/25/13 1416 Last data filed at 11/25/13 1140  Gross per 24 hour  Intake    600 ml  Output    450 ml  Net    150 ml    Labs:   Recent Labs Lab 11/21/13 1550  11/23/13 0310 11/24/13 0240 11/25/13 0350  NA 140  < > 140 144 140  K 5.6*  < > 4.9 4.2 4.2  CL 94*  < > 104 105 101  CO2 32  < > 26 31 32  BUN 61*  < > 35* 23 13  CREATININE 1.21*  < > 0.53 0.53 0.49*  CALCIUM 8.4  < > 8.5 8.8 8.7  MG 2.6*  --   --   --   --   PHOS 6.8*  --   --   --   --   GLUCOSE 110*  < > 165* 110* 83  < > = values in this interval not displayed.  CBG (last 3)   Recent Labs  11/24/13 2127 11/25/13 0619 11/25/13 1100  GLUCAP 90 97 97    Scheduled Meds: . ipratropium-albuterol  3 mL Nebulization Q6H WA  . levofloxacin  250 mg Oral Daily  . [START ON 11/26/2013] predniSONE  20 mg Oral Q breakfast    Continuous Infusions:    Past Medical History  Diagnosis Date  . Hyperlipidemia   . Hypertension   . History of tobacco abuse     Past Surgical History  Procedure Laterality Date  .  Tonsillectomy and adenoidectomy    . Appendectomy    . Childbirth      x 3    Maureen ChattersKatie Kanisha Duba, RD, LDN Pager #: [phone] After-Hours Pager #: [phone]

## 2013-11-25 NOTE — Progress Notes (Addendum)
O2 sat 91% on 4L. Bilateral breath sounds clear/diminished but patient has a strong coarse cough.  Slightly labored after working with PT. Remains on 4L O2 for now. Encourage and return demonstrated deep breathing. Remaining VSS. Call bell and family near.Laurie LeversBaldwin, Laurie Horne

## 2013-11-25 NOTE — Progress Notes (Signed)
PULMONARY / CRITICAL CARE MEDICINE  Name: Laurie Horne MRN: [id number] DOB: 02/15/1934    ADMISSION DATE:  11/21/2013  REFERRING MD :  EDP PRIMARY SERVICE: PCCM   CHIEF COMPLAINT:  Acute respiratory failure  BRIEF PATIENT DESCRIPTION:  80 yo smoker admitted with hypercarbic respiratory failure requiring intubation.  SIGNIFICANT EVENTS / STUDIES:  3/1  TTE >>> EF 60-65, no RWMA, mildly dilated RV with mildly decreased systolic function, PAP 48 torr 3/3 SLP Eval >>> recommend SLP Cognitive Evaluation as pt may require some level of rehab given prior level of independence.  LINES / TUBES: OETT 2/28 >>> 3/2 OGT 2/28 >>> 3/2 Foley 2/28 >>> 3/2  CULTURES: 2/28  Sputum >>>  STAPHYLOCOCCUS AUREUS 2/28  Blood >>> 2/28  Urine >>> neg  ANTIBIOTICS: Unasyn 2/28 >>> 3/2 ( Unasyn shortage ) Levaquin 3/2 >>> Vanc 3/3 >>> 3/04   INTERVAL HISTORY:  No acute events.  Breathing somewhat improved, mild wheezes overnight.  Per pt, "my memory is what's getting worse". Vitals stable.  VITAL SIGNS: Temp:  [97.5 F (36.4 C)-98.1 F (36.7 C)] 97.9 F (36.6 C) (03/04 0510) Pulse Rate:  [79-83] 83 (03/04 0510) Resp:  [18-30] 20 (03/04 0510) BP: (158-178)/(75-81) 160/78 mmHg (03/04 0510) SpO2:  [95 %-97 %] 96 % (03/04 0510) Weight:  [91 lb 9.6 oz (41.549 kg)] 91 lb 9.6 oz (41.549 kg) (03/04 0510)  INTAKE / OUTPUT: Intake/Output     03/03 0701 - 03/04 0700 03/04 0701 - 03/05 0700   P.O. 600    I.V. (mL/kg) 221.7 (5.3)    NG/GT     IV Piggyback 150    Total Intake(mL/kg) 971.7 (23.4)    Urine (mL/kg/hr) 300 (0.3)    Total Output 300     Net +671.7          Urine Occurrence 5 x      PHYSICAL EXAMINATION: General:  Resting in bed watching TV, no distress Neuro:  Awake, takes some time to answer questions but does answer appropriately.  Memory recall poor, could not remember any of her doctors names, nor my name during exam despite me reminding her. HEENT:  PERRL, face/scalp abrasions,  small hematoma middle forehead. Cardiovascular:  RRR, no m/r/g Lungs:  Bilateral diminished air entry, no w/r/r Abdomen:  Soft, nontender, bowel sounds diminished Musculoskeletal:  Moves all extremities, no edema Skin:  Poor turgor  LABS:  CBC  Recent Labs Lab 11/23/13 0310 11/24/13 0240 11/25/13 0350  WBC 18.4* 31.0* 25.9*  HGB 13.1 12.9 12.7  HCT 41.3 41.5 40.8  PLT 103* 108* 105*   Coag's  Recent Labs Lab 11/21/13 1550  APTT 31  INR 1.29   BMET  Recent Labs Lab 11/23/13 0310 11/24/13 0240 11/25/13 0350  NA 140 144 140  K 4.9 4.2 4.2  CL 104 105 101  CO2 26 31 32  BUN 35* 23 13  CREATININE 0.53 0.53 0.49*  GLUCOSE 165* 110* 83   Electrolytes  Recent Labs Lab 11/21/13 1550  11/23/13 0310 11/24/13 0240 11/25/13 0350  CALCIUM 8.4  < > 8.5 8.8 8.7  MG 2.6*  --   --   --   --   PHOS 6.8*  --   --   --   --   < > = values in this interval not displayed. Sepsis Markers  Recent Labs Lab 11/21/13 1550 11/21/13 1600  LATICACIDVEN 1.2 1.09  PROCALCITON 0.25  --    ABG  Recent Labs Lab 11/21/13 1733  11/22/13 0403  PHART 7.434 7.347*  PCO2ART 43.9 52.3*  PO2ART 72.0* 89.0   Liver Enzymes  Recent Labs Lab 11/21/13 1550 11/25/13 0350  AST 478* 68*  ALT 347* 138*  ALKPHOS 185* 119*  BILITOT 1.1 0.5  ALBUMIN 3.2* 2.4*   Cardiac Enzymes  Recent Labs Lab 11/21/13 1550 11/21/13 2002 11/22/13 0133  TROPONINI <0.30 <0.30 <0.30  PROBNP 16993.0*  --   --    Glucose  Recent Labs Lab 11/24/13 0407 11/24/13 0825 11/24/13 1124 11/24/13 1645 11/24/13 2127 11/25/13 0619  GLUCAP 118* 113* 134* 145* 90 97   ASSESSMENT / PLAN:  A:  Acute on chronic respiratory failure 2nd to AECOPD with S aureus in sputum. P:   Goal SpO2>90 Albuterol / Atrovent D/c Solu-Medrol Change to prednisone 20 mg on 3/04  A:  Elevated BNP. Suspected cor pulmonale based on TTE results. Hx of HTN. P:  Hydralazine PRN  A:   Protein calorie  malnutrition. RN reports patient having difficulties chewing / swallowing food - SLP eval completed 3/3, recommend regular/thin liquid diet. P:   Carb modified diet Will ask nutrition to assess dietary needs  A:   Acute encephalopathy, resolved Severe deconditioning  P:   PT/OT SLP Cognitive Eval ordered per SLP recs.   Rutherford Guys, PA - [facility] Pgr: [phone]  or [phone]   Reviewed above, examined pt, and agree with assessment/plan.  She is recovering from AECOPD.  Will transition to oral Abx and prednisone.  Await inpt from PT/OT >> likely will need SNF placement as transition before going home.  Updated family at bedside.  Coralyn Helling, MD [facility] Pulmonary/Critical Care 11/25/2013, 11:12 AM Pager:  [phone] After 3pm call: [phone]

## 2013-11-25 NOTE — Evaluation (Signed)
Speech Language Pathology Evaluation Patient Details Name: Laurie Horne MRN: [id number] DOB: 03-05-1934 Today's Date: 11/25/2013 Time: 8469-6295 SLP Time Calculation (min): 28 min  Problem List:  Patient Active Problem List   Diagnosis Date Noted  . Respiratory failure with hypercapnia 11/21/2013  . Fall 11/21/2013  . Lethargic 11/21/2013  . Respiratory failure 11/21/2013  . History of tobacco abuse    Past Medical History:  Past Medical History  Diagnosis Date  . Hyperlipidemia   . Hypertension   . History of tobacco abuse    Past Surgical History:  Past Surgical History  Procedure Laterality Date  . Tonsillectomy and adenoidectomy    . Appendectomy    . Childbirth      x 3   HPI:  80 yo smoker who fell 4 days prior to admit(2/24) and suffered facial and knee abrasions and bruises.  She presented to ER at [facility] with hypercarbic respiratory failure and was intubated 2/28-3/2.  Pt's husband reports hx of pt complaining that she can not swallow (will chew food and remove from mouth.)  History of low weight; husband and son very concerned about continuous weight loss.  Passed swallow study 3/3 - deficits appear to be behavioral, not mechanical.  Husband reports two falls: the first six months ago, when pt fell and hit her face/head on concrete pad near front door and the most recent fall.  He describes increased confusion and memory changes subsequent to first fall.  Pt refused medical treatment at that time.   Assessment / Plan / Recommendation Clinical Impression  Pt presents with moderate cognitive deficits, deteriorated from baseline memory deficits.  In addition, her son, Laurie Horne, describes increased self-isolation, decreased motivation, decreased appetite.  Today, pt presents with diffuse deficits in working memory and prospective memory, as well as decreased long-term recall of events from her marriage, working circumstances.  Pt unable to recall the tasks she completed with OT less  than ten minutes before my arrival.  Inattention and impaired problem solving also evident; pt does show recognition of errors and verbalizes frustration over memory changes.  Pt will benefit from acute SLP as well as f/u in next level of care.  Her husband and son request a psychiatry consult given behavioral changes in the past several years and preoccupation with weight loss.      SLP Assessment  Patient needs continued Speech Lanaguage Pathology Services    Follow Up Recommendations  Skilled Nursing facility    Frequency and Duration min 2x/week  1 week      SLP Goals  SLP Goals Potential to Achieve Goals: Fair Progress/Goals/Alternative treatment plan discussed with pt/caregiver and they: Agree  SLP Evaluation Prior Functioning  Cognitive/Linguistic Baseline: Baseline deficits Baseline deficit details: decreased motivation, memory loss > year per son Type of Home: House  Lives With: Spouse Available Help at Discharge: Family Vocation: Retired   IT consultant  Overall Cognitive Status: Impaired/Different from baseline Arousal/Alertness: Awake/alert Orientation Level: Oriented X4 (with use of calendar) Attention: Sustained Sustained Attention: Impaired Sustained Attention Impairment: Verbal basic;Functional basic Memory: Impaired Memory Impairment: Storage deficit;Retrieval deficit;Decreased recall of new information;Decreased long term memory;Prospective memory Decreased Long Term Memory: Verbal basic Awareness: Appears intact Problem Solving: Impaired Problem Solving Impairment: Verbal basic;Functional basic Executive Function: Reasoning Reasoning: Impaired Reasoning Impairment: Verbal basic Safety/Judgment: Impaired    Comprehension  Auditory Comprehension Overall Auditory Comprehension: Appears within functional limits for tasks assessed Commands: Impaired Multistep Basic Commands: 50-74% accurate Visual Recognition/Discrimination Discrimination: Within Function  Limits Reading Comprehension  Reading Status: Within funtional limits    Expression Expression Primary Mode of Expression: Verbal Verbal Expression Overall Verbal Expression: Appears within functional limits for tasks assessed Written Expression Dominant Hand: Left Written Expression: Not tested   Oral / Motor Oral Motor/Sensory Function Overall Oral Motor/Sensory Function: Appears within functional limits for tasks assessed Motor Speech Overall Motor Speech: Appears within functional limits for tasks assessed   Laurie Horne, [place] CCC/SLP Pager [phone]      Laurie Horne, Laurie Horne 11/25/2013, 2:37 PM

## 2013-11-26 DIAGNOSIS — J40 Bronchitis, not specified as acute or chronic: Secondary | ICD-10-CM

## 2013-11-26 DIAGNOSIS — R1319 Other dysphagia: Secondary | ICD-10-CM

## 2013-11-26 DIAGNOSIS — B9561 Methicillin susceptible Staphylococcus aureus infection as the cause of diseases classified elsewhere: Secondary | ICD-10-CM

## 2013-11-26 DIAGNOSIS — E43 Unspecified severe protein-calorie malnutrition: Secondary | ICD-10-CM | POA: Insufficient documentation

## 2013-11-26 DIAGNOSIS — R1310 Dysphagia, unspecified: Secondary | ICD-10-CM

## 2013-11-26 LAB — CBC
HEMATOCRIT: 44.2 % (ref 36.0–46.0)
Hemoglobin: 13.5 g/dL (ref 12.0–15.0)
MCH: 29 pg (ref 26.0–34.0)
MCHC: 30.5 g/dL (ref 30.0–36.0)
MCV: 95.1 fL (ref 78.0–100.0)
Platelets: 102 10*3/uL — ABNORMAL LOW (ref 150–400)
RBC: 4.65 MIL/uL (ref 3.87–5.11)
RDW: 14.7 % (ref 11.5–15.5)
WBC: 22 10*3/uL — ABNORMAL HIGH (ref 4.0–10.5)

## 2013-11-26 LAB — GLUCOSE, CAPILLARY: Glucose-Capillary: 81 mg/dL (ref 70–99)

## 2013-11-26 MED ORDER — BISOPROLOL-HYDROCHLOROTHIAZIDE 2.5-6.25 MG PO TABS
1.0000 | ORAL_TABLET | Freq: Every day | ORAL | Status: DC
  Administered 2013-11-26 – 2013-11-27 (×2): 1 via ORAL
  Filled 2013-11-26 (×2): qty 1

## 2013-11-26 MED ORDER — BISOPROLOL-HYDROCHLOROTHIAZIDE 2.5-6.25 MG PO TABS: 1.0000 | ORAL_TABLET | Freq: Every day | ORAL | Status: DC

## 2013-11-26 MED ORDER — ASPIRIN 81 MG PO TABS: 81.0000 mg | ORAL_TABLET | Freq: Every day | ORAL | Status: DC

## 2013-11-26 MED ORDER — ASPIRIN 81 MG PO CHEW
81.0000 mg | CHEWABLE_TABLET | Freq: Every day | ORAL | Status: DC
  Administered 2013-11-26 – 2013-11-27 (×2): 81 mg via ORAL
  Filled 2013-11-26 (×2): qty 1

## 2013-11-26 NOTE — Progress Notes (Signed)
Clinical Social Work Department BRIEF PSYCHOSOCIAL ASSESSMENT 11/26/2013  Patient:  Wonda ChengLYBON,Kiyla C     Account Number:  [id number]     Admit date:  11/21/2013  Clinical Social Worker:  Carren RangPURITZ,Asees Manfredi, LCSWA  Date/Time:  11/26/2013 12:00 M  Referred by:  Physician  Date Referred:  11/25/2013 Referred for  SNF Placement   Other Referral:   Interview type:  Other - See comment Other interview type:   CSW spoke to patient and husband by bedside    PSYCHOSOCIAL DATA Living Status:  HUSBAND Admitted from facility:   Level of care:   Primary support name:  John Litke Primary support relationship to patient:  SPOUSE Degree of support available:   Good    CURRENT CONCERNS Current Concerns  Post-Acute Placement   Other Concerns:    SOCIAL WORK ASSESSMENT / PLAN Clinical Social Worker received referral for SNF placement at d/c. CSW introduced self and explained reason for visit. Patient had visitor by bedside, patient's husband. CSW explained SNF process to patient and patient's husband. Both reported they are agreeable for SNF placement. Husband states that they are familiar with the process and [facility]. CSW will complete FL2 for MD's signature and will update patient and family when bed offers are received.   Assessment/plan status:  Psychosocial Support/Ongoing Assessment of Needs Other assessment/ plan:   Information/referral to community resources:   SNF/CSW info    PATIENT'S/FAMILY'S RESPONSE TO PLAN OF CARE: Patient is agreeable to snf placement.       Maree KrabbeLindsay Yoniel Arkwright, MSW, Theresia MajorsLCSWA [phone]

## 2013-11-26 NOTE — Progress Notes (Signed)
PULMONARY / CRITICAL CARE MEDICINE  Name: Laurie Horne MRN: [id number] DOB: 07-Jul-1934    ADMISSION DATE:  11/21/2013  REFERRING MD :  EDP PRIMARY SERVICE: PCCM   CHIEF COMPLAINT:  Acute respiratory failure  BRIEF PATIENT DESCRIPTION:  80 yo smoker admitted with hypercarbic respiratory failure requiring intubation.  SIGNIFICANT EVENTS / STUDIES:  3/1  TTE >>> EF 60-65, no RWMA, mildly dilated RV with mildly decreased systolic function, PAP 48 torr 3/3 SLP Eval >>> recommend SLP Cognitive Evaluation as pt may require some level of rehab given prior level of independence.  LINES / TUBES: OETT 2/28 >>> 3/2 OGT 2/28 >>> 3/2 Foley 2/28 >>> 3/2  CULTURES: 2/28  Sputum >>> MSSA 2/28  Blood >>> 2/28  Urine >>> negative  ANTIBIOTICS: Unasyn 2/28 >>> 3/2 ( Unasyn shortage ) Levaquin 3/2 >>> Vanc 3/3 >>> 3/04  INTERVAL HISTORY:   Noted to have trouble swallowing this morning leading to worsening hypoxia.  C/o chest congestion.  VITAL SIGNS: Temp:  [97.6 F (36.4 C)-98.2 F (36.8 C)] 97.6 F (36.4 C) (03/05 0410) Pulse Rate:  [80-92] 87 (03/05 0846) Resp:  [18] 18 (03/05 0410) BP: (152-177)/(78-80) 177/78 mmHg (03/05 0410) SpO2:  [74 %-97 %] 94 % (03/05 0846) Weight:  [89 lb 14.4 oz (40.778 kg)] 89 lb 14.4 oz (40.778 kg) (03/05 0500)  INTAKE / OUTPUT: Intake/Output     03/04 0701 - 03/05 0700 03/05 0701 - 03/06 0700   P.O. 360    I.V. (mL/kg)     IV Piggyback     Total Intake(mL/kg) 360 (8.8)    Urine (mL/kg/hr) 1250 (1.3) 200 (1.9)   Total Output 1250 200   Net -890 -200          PHYSICAL EXAMINATION: General: thin Neuro: alert, follows commands HEENT: face/scalp abrasions, small hematoma middle forehead. Cardiovascular: regular Lungs: decreased breath sounds, scattered rhonchi, no wheeze Abdomen:  Soft, nontender Musculoskeletal: no edema Skin: multiple areas of ecchymosis  LABS:  CBC  Recent Labs Lab 11/24/13 0240 11/25/13 0350 11/26/13 0234  WBC  31.0* 25.9* 22.0*  HGB 12.9 12.7 13.5  HCT 41.5 40.8 44.2  PLT 108* 105* 102*   Coag's  Recent Labs Lab 11/21/13 1550  APTT 31  INR 1.29   BMET  Recent Labs Lab 11/23/13 0310 11/24/13 0240 11/25/13 0350  NA 140 144 140  K 4.9 4.2 4.2  CL 104 105 101  CO2 26 31 32  BUN 35* 23 13  CREATININE 0.53 0.53 0.49*  GLUCOSE 165* 110* 83   Electrolytes  Recent Labs Lab 11/21/13 1550  11/23/13 0310 11/24/13 0240 11/25/13 0350  CALCIUM 8.4  < > 8.5 8.8 8.7  MG 2.6*  --   --   --   --   PHOS 6.8*  --   --   --   --   < > = values in this interval not displayed.  Sepsis Markers  Recent Labs Lab 11/21/13 1550 11/21/13 1600  LATICACIDVEN 1.2 1.09  PROCALCITON 0.25  --    ABG  Recent Labs Lab 11/21/13 1733 11/22/13 0403  PHART 7.434 7.347*  PCO2ART 43.9 52.3*  PO2ART 72.0* 89.0   Liver Enzymes  Recent Labs Lab 11/21/13 1550 11/25/13 0350  AST 478* 68*  ALT 347* 138*  ALKPHOS 185* 119*  BILITOT 1.1 0.5  ALBUMIN 3.2* 2.4*   Cardiac Enzymes  Recent Labs Lab 11/21/13 1550 11/21/13 2002 11/22/13 0133  TROPONINI <0.30 <0.30 <0.30  PROBNP 16993.0*  --   --  Glucose  Recent Labs Lab 11/24/13 2127 11/25/13 0619 11/25/13 1100 11/25/13 1607 11/25/13 2121 11/26/13 0609  GLUCAP 90 97 97 159* 162* 81   ASSESSMENT / PLAN:  A:  Acute on chronic respiratory failure 2nd to AECOPD with S aureus in sputum. P:   Oxygen to keep SpO2 > 90% Continue duoneb Wean off prednisone as tolerated over next 1 week Day 6/7 Abx, currently on levaquin  A:  Elevated BNP. Suspected cor pulmonale based on TTE results. Hx of HTN. P:  Resume ziac, ASA 3/05 Hydralazine PRN Hold lisinopril for now  A:   Severe potein calorie malnutrition. Dysphagia. P:   Carb modified diet Ensure complete BID MVI daily F/u with speech therapy >> further therapy at SNF  A:   Acute encephalopathy, resolved. Severe deconditioning. P:   PT/OT >> recommending  SNF   Coralyn HellingVineet Garo Heidelberg, MD [facility] Pulmonary/Critical [facility] 11/26/2013, 9:34 AM Pager:  [phone] After 3pm call: [phone]

## 2013-11-26 NOTE — Progress Notes (Signed)
Informed per Respiratory therapist that O2 sat prior to treatment was 74%. Bilateral breath sounds are clear and diminished but patient continues to have a strong coarse cough. O2 sat 95% during treatment and 90-93% on 5L post treatment. MD informed; will evaluate patient this am. Will continue to monitor.Laurie LeversBaldwin, Laurie Horne

## 2013-11-26 NOTE — Progress Notes (Addendum)
Clinical Social Work Department CLINICAL SOCIAL WORK PLACEMENT NOTE 11/26/2013  Patient:  Laurie Horne,[name]  Account Number:  [id number] Admit date:  11/21/2013  Clinical Social Worker:  Carren RangLINDSAY Ciro Tashiro, LCSWA  Date/time:  11/25/2013 07:23 PM  Clinical Social Work is seeking post-discharge placement for this patient at the following level of care:   SKILLED NURSING   (*CSW will update this form in Epic as items are completed)   11/26/2013  Patient/family provided with [facility] Department of Clinical Social Work's list of facilities offering this level of care within the geographic area requested by the patient (or if unable, by the patient's family).  11/26/2013  Patient/family informed of their freedom to choose among providers that offer the needed level of care, that participate in Medicare, Medicaid or managed care program needed by the patient, have an available bed and are willing to accept the patient.  11/26/2013  Patient/family informed of [facility]' ownership interest in [facility], as well as of the fact that they are under no obligation to receive care at this facility.  PASARR submitted to EDS on 11/25/2013 PASARR number received from [employer] on 11/25/2013  FL2 transmitted to all facilities in geographic area requested by pt/family on  11/25/2013 FL2 transmitted to all facilities within larger geographic area on   Patient informed that his/her managed care company has contracts with or will negotiate with  certain facilities, including the following:     Patient/family informed of bed offers received:  11/26/2013 [facility] Physician recommends and patient chooses bed at    Patient to be transferred to  on  11/27/2013 Patient to be transferred to facility by EMS  The following physician request were entered in Epic:   Additional Comments:  Maree KrabbeLindsay Michel Eskelson, MSW, [employer]CSWA [phone]

## 2013-11-26 NOTE — Progress Notes (Signed)
CSW has [facility] and has updated patient's husband.  Laurie KrabbeLindsay Xitlaly Ault, MSW, Theresia MajorsLCSWA [phone]

## 2013-11-26 NOTE — Progress Notes (Signed)
Occupational Therapy Treatment Patient Details Name: Laurie Horne MRN: [id number] DOB: 08/24/1934 Today's Date: 11/26/2013 Time: [id number] OT Time Calculation (min): 34 min  OT Assessment / Plan / Recommendation  History of present illness Pt admitted with acute hypercarbaric respiratory failure requiring intubation from 11/21/13-11/23/13.  Pt is a current smoker.  She has a hx of falls, most recently 2 weeks ago walking in ice, resulting in multiple facial and knee abrasions.  Pt also found to be protein malnourished.  Family voiced concerns with pt's poor eating and drinking habits prior to admission and resistance to seeking medical care.    OT comments  Pt much more alert today.  Seated in chair working a word search book.  Pt's memory deficits and problem solving more apparent today.  Did not recall seeing this therapist yesterday and asking a question twice within a span of 2 minutes.  Impaired ability to switch from task to task ie: from word search to eating to toileting and then returning to eating. Pt appearing and stating she was hungry, but repeatedly spit out solid food after chewing and continued this pattern despite failure to be able to swallow.   Follow Up Recommendations  SNF;Supervision/Assistance - 24 hour    Barriers to Discharge       Equipment Recommendations       Recommendations for Other Services    Frequency Min 2X/week   Progress towards OT Goals Progress towards OT goals: Progressing toward goals  Plan Discharge plan remains appropriate    Precautions / Restrictions Precautions Precautions: Fall   Pertinent Vitals/Pain 94% on 4.5L 02, no pain    ADL  Eating/Feeding: Independent Where Assessed - Eating/Feeding: Chair Grooming: Wash/dry hands;Wash/dry face;Minimal assistance Where Assessed - Grooming: Unsupported sitting Toilet Transfer: Minimal assistance Toilet Transfer Method: Surveyor, mineralstand pivot Toilet Transfer Equipment: Chemical engineerBedside commode Toileting - Clothing  Manipulation and Hygiene: Supervision/safety Where Assessed - Toileting Clothing Manipulation and Hygiene: Sit on 3-in-1 or toilet ADL Comments: Pt able to self feed from chair, pull own panties up and down in standing with min guard assist, but did not spontaneously use her R hand to wash her hands with a washcloth without being cued.    OT Diagnosis:    OT Problem List:   OT Treatment Interventions:     OT Goals(current goals can now be found in the care plan section) Acute Rehab OT Goals Patient Stated Goal: get stronger  Visit Information  Last OT Received On: 11/26/13 Assistance Needed: +1 History of Present Illness: Pt admitted with acute hypercarbaric respiratory failure requiring intubation from 11/21/13-11/23/13.  Pt is a current smoker.  She has a hx of falls, most recently 2 weeks ago walking in ice, resulting in multiple facial and knee abrasions.  Pt also found to be protein malnourished.  Family voiced concerns with pt's poor eating and drinking habits prior to admission and resistance to seeking medical care.     Subjective Data      Prior Functioning       Cognition  Cognition Arousal/Alertness: Awake/alert Behavior During Therapy: Flat affect Overall Cognitive Status: Impaired/Different from baseline Area of Impairment: Memory;Awareness;Problem solving Memory: Decreased short-term memory Awareness: Emergent Problem Solving: Decreased initiation;Requires verbal cues    Mobility  Transfers Overall transfer level: Needs assistance Transfers: Sit to/from Stand Sit to Stand: Min assist Stand pivot transfers: Min assist General transfer comment: lifting/steadying assist    Exercises      Balance Balance Overall balance assessment: Needs assistance Standing balance-Leahy Scale:  Poor  End of Session OT - End of Session Equipment Utilized During Treatment: Gait belt Activity Tolerance: Patient limited by fatigue Patient left: in chair;with call bell/phone within  reach;with family/visitor present  GO     Evern Bio 11/26/2013, 1:21 PM [phone]

## 2013-11-27 DIAGNOSIS — R1310 Dysphagia, unspecified: Secondary | ICD-10-CM

## 2013-11-27 DIAGNOSIS — E43 Unspecified severe protein-calorie malnutrition: Secondary | ICD-10-CM

## 2013-11-27 LAB — BASIC METABOLIC PANEL
BUN: 8 mg/dL (ref 6–23)
CO2: 38 meq/L — AB (ref 19–32)
Calcium: 8.8 mg/dL (ref 8.4–10.5)
Chloride: 91 mEq/L — ABNORMAL LOW (ref 96–112)
Creatinine, Ser: 0.42 mg/dL — ABNORMAL LOW (ref 0.50–1.10)
GFR calc Af Amer: >90 mL/min (ref >90)
GFR calc non Af Amer: >90 mL/min (ref >90)
GLUCOSE: 72 mg/dL (ref 70–99)
POTASSIUM: 4.5 meq/L (ref 3.7–5.3)
SODIUM: 138 meq/L (ref 137–147)

## 2013-11-27 LAB — CBC
HEMATOCRIT: 46.2 % — AB (ref 36.0–46.0)
HEMOGLOBIN: 14.5 g/dL (ref 12.0–15.0)
MCH: 29.6 pg (ref 26.0–34.0)
MCHC: 31.4 g/dL (ref 30.0–36.0)
MCV: 94.3 fL (ref 78.0–100.0)
Platelets: 101 10*3/uL — ABNORMAL LOW (ref 150–400)
RBC: 4.9 MIL/uL (ref 3.87–5.11)
RDW: 14.4 % (ref 11.5–15.5)
WBC: 18.3 10*3/uL — ABNORMAL HIGH (ref 4.0–10.5)

## 2013-11-27 MED ORDER — IPRATROPIUM-ALBUTEROL 0.5-2.5 (3) MG/3ML IN SOLN: 3.0000 mL | Freq: Four times a day (QID) | RESPIRATORY_TRACT | Status: DC

## 2013-11-27 MED ORDER — ALBUTEROL SULFATE (2.5 MG/3ML) 0.083% IN NEBU: 2.5000 mg | INHALATION_SOLUTION | RESPIRATORY_TRACT | Status: DC | PRN

## 2013-11-27 MED ORDER — PREDNISONE 10 MG PO TABS: ORAL_TABLET | ORAL | Status: DC

## 2013-11-27 MED ORDER — ENSURE COMPLETE PO LIQD
237.0000 mL | Freq: Two times a day (BID) | ORAL | Status: AC
Start: 2013-11-27 — End: ?

## 2013-11-27 MED ORDER — ADULT MULTIVITAMIN W/MINERALS CH: 1.0000 | ORAL_TABLET | Freq: Every day | ORAL | Status: DC

## 2013-11-27 NOTE — Discharge Summary (Signed)
Physician Discharge Summary  Patient ID: Laurie Horne MRN: [id number] DOB/AGE: [birth year] 78 y.o.  Admit date: 11/21/2013 Discharge date: 11/27/2013    Discharge Diagnoses:  Acute on Chronic Respiratory Failure Acute Exacerbation of COPD Staph aureus in Sputum Hypoxemia Elevated BNP Suspected Cor Pulmonale  HTN Severe Protein Calorie Malnutrition Dysphagia  Acute Encephalopathy Severe Deconditioning                                                                      DISCHARGE PLAN BY DIAGNOSIS      Acute on chronic respiratory failure 2nd to AECOPD with S aureus in sputum.   Discharge Plan: Wean oxygen to keep SpO2 > 90%  Continue duoneb  Wean off prednisone as tolerated over next 1 week  Day 7/7 Abx, currently on levaquin d/c after dose on 3/06    Elevated BNP.  Suspected cor pulmonale based on TTE results.  Hx of HTN.   Discharge Plan: Resumed ziac, ASA 3/05  Hold lisinopril for now >> re-assess as outpt    Severe potein calorie malnutrition.  Dysphagia.   Discharge Plan: Carb modified diet  Ensure complete BID  MVI daily  F/u with speech therapy >> further therapy at SNF.  See last SLP notes at bottom of summary.     Acute encephalopathy, resolved.  Severe deconditioning.  S/P Mechanical Fall   Discharge Plan: PT/OT >> recommending SNF Hold xanax at discharge, re-assess as outpatient.  Recommend caution with COPD & sedating medications.                     DISCHARGE SUMMARY   Laurie Horne is a 80 y.o. y/o female, smoker, with a PMH of HLD, HTN who presented to [facility] on 2/28 with hypercarbic respiratory failure.  Initial ABG 7.04 / pCO2 >100 / O2 274.  She suffered a fall 4 days prior to admit and had resultant facial and knee abrasions/bruises.  CT of head, neck and maxillofacial scans completed and negative for acute fractures or ICH.  Due to altered mental status and inability to protect her airway, she was intubated in the  emergency room.  She was noted to have transient mucus plugging to the RUL post intubation.  She was treated empirically  for aspiration PNA & acute exacerbation of COPD with steroids, IV antibiotics and  Nebulized bronchodilators.  Patient had a brief episode of hypotension on admit that responded to IVF.  2D Echo was assessed and demonstrated an EF 60-65, no RWMA, mildly dilated RV with mildly decreased systolic function, PAP 48 - suspicious for Cor Pulmonale.  Patient remained on mechanical ventilation until 11/23/13. Sputum cultures were assessed and positive for MSSA.  She was unable to be weaned off oxygen prior to discharge and remains on 2L per [place].  Patient has completed her antibiotic regimen & will complete a prednisone taper.  In the setting of acute critical illness, ACE-I was held during admission and will be post discharge (see above).  Post extubation, she was evaluated by SLP for dysphagia.   SLP recommended ongoing acute SLP at SNF.She was also evaluated by nutrition and recommendations as above.  Given patients acute critical illness and deconditioning, patient was evaluated by PT and  recommended for SNF.  See plan as above.     SIGNIFICANT EVENTS / STUDIES:  3/1 TTE >>> EF 60-65, no RWMA, mildly dilated RV with mildly decreased systolic function, PAP 48 torr  3/3 SLP Eval >>> recommend SLP Cognitive Evaluation as pt may require some level of rehab given prior level of independence.   LINES / TUBES:  OETT 2/28 >>> 3/2  OGT 2/28 >>> 3/2  Foley 2/28 >>> 3/2   CULTURES:  2/28 Sputum >>> MSSA  2/28 Blood >>>  2/28 Urine >>> negative   ANTIBIOTICS:  Unasyn 2/28 >>> 3/2 ( Unasyn shortage )  Levaquin 3/2 >>>  Vanc 3/3 >>> 3/04   Discharge Exam: General: thin, sitting in chair  Neuro: alert, follows commands  HEENT: face/scalp abrasions, small hematoma middle forehead.  Cardiovascular: regular  Lungs: decreased breath sounds, scattered rhonchi, no wheeze  Abdomen: Soft,  nontender  Musculoskeletal: no edema  Skin: multiple areas of ecchymosis   Filed Vitals:   11/27/13 0337 11/27/13 0346 11/27/13 0828 11/27/13 1059  BP:  156/80  144/83  Pulse:  70  74  Temp:  98.1 F (36.7 C)    TempSrc:  Oral    Resp:  18    Height:      Weight: 87 lb 8.4 oz (39.7 kg)     SpO2:  96% 95%     Discharge Labs  BMET  Recent Labs Lab 11/21/13 1550  11/22/13 0133 11/23/13 0310 11/24/13 0240 11/25/13 0350 11/27/13 0322  NA 140  < > 145 140 144 140 138  K 5.6*  < > 4.1 4.9 4.2 4.2 4.5  CL 94*  < > 102 104 105 101 91*  CO2 32  < > $R'27 26 31 'Fb$ 32 38*  GLUCOSE 110*  < > 96 165* 110* 83 72  BUN 61*  < > 55* 35* $Remov'23 13 8  'YEzGTW$ CREATININE 1.21*  < > 0.96 0.53 0.53 0.49* 0.42*  CALCIUM 8.4  < > 8.0* 8.5 8.8 8.7 8.8  MG 2.6*  --   --   --   --   --   --   PHOS 6.8*  --   --   --   --   --   --   < > = values in this interval not displayed.  CBC  Recent Labs Lab 11/25/13 0350 11/26/13 0234 11/27/13 0322  HGB 12.7 13.5 14.5  HCT 40.8 44.2 46.2*  WBC 25.9* 22.0* 18.3*  PLT 105* 102* 101*   Anti-Coagulation  Recent Labs Lab 11/21/13 1550  INR 1.29    FOLLOW UP:  Follow up per SNF recommendations at time of discharge.       Medication List    STOP taking these medications       ALPRAZolam 0.25 MG tablet  Commonly known as:  XANAX     ramipril 5 MG tablet  Commonly known as:  ALTACE      TAKE these medications       albuterol (2.5 MG/3ML) 0.083% nebulizer solution  Commonly known as:  PROVENTIL  Take 3 mLs (2.5 mg total) by nebulization every 3 (three) hours as needed for wheezing or shortness of breath.     aspirin 81 MG tablet  Take 81 mg by mouth daily.     bisoprolol-hydrochlorothiazide 2.5-6.25 MG per tablet  Commonly known as:  ZIAC  1 tablet po daily     brinzolamide 1 % ophthalmic suspension  Commonly known as:  AZOPT  1 drop. As directed     feeding supplement (ENSURE COMPLETE) Liqd  Take 237 mLs by mouth 2 (two) times daily  between meals.     ipratropium-albuterol 0.5-2.5 (3) MG/3ML Soln  Commonly known as:  DUONEB  Take 3 mLs by nebulization every 6 (six) hours.     meclizine 12.5 MG tablet  Commonly known as:  ANTIVERT  Take 12.5 mg by mouth 3 (three) times daily as needed for dizziness.     multivitamin with minerals Tabs tablet  Take 1 tablet by mouth daily.     predniSONE 10 MG tablet  Commonly known as:  DELTASONE  2 tabs daily for 4 days, then 1 tab daily for 4 days, then stop     Travoprost (BAK Free) 0.004 % Soln ophthalmic solution  Commonly known as:  TRAVATAN  1 drop. As directed        Disposition: SNF   Discharged Condition: SURIYA KOVARIK has met maximum benefit of inpatient care and is medically stable and cleared for discharge.  Patient is pending follow up as above.      Time spent on disposition:  Greater than 35 minutes.   Signed: Noe Gens, NP-C [facility] Pgr: [phone] Office: [phone]     3/4 SLP Note:  "Pt presents with moderate cognitive deficits, deteriorated from baseline memory deficits. In addition, her son, Hilliard Clark, describes increased self-isolation, decreased motivation, decreased appetite. Today, pt presents with diffuse deficits in working memory and prospective memory, as well as decreased long-term recall of events from her marriage, working circumstances. Pt unable to recall the tasks she completed with OT less than ten minutes before my arrival. Inattention and impaired problem solving also evident; pt does show recognition of errors and verbalizes frustration over memory changes. Pt will benefit from acute SLP as well as f/u in next level of care. Her husband and son request a psychiatry consult given behavioral changes in the past several years and preoccupation with weight loss".   Chesley Mires, MD [facility] 11/27/2013, 3:25 PM Pager:  [phone] After 3pm call: [phone]

## 2013-11-27 NOTE — Progress Notes (Addendum)
Clinical Social Worker facilitated patient discharge by [facility]. Patient agreeable to this plan and arranging transport via EMS for 3pm. CSW will sign off, as social work intervention is no longer needed.  Maree KrabbeLindsay Deyonte Cadden, MSW, Theresia MajorsLCSWA [phone]

## 2013-11-27 NOTE — Progress Notes (Signed)
Physical Therapy Treatment Patient Details Name: Laurie Horne MRN: [id number] DOB: 10/26/1933 Today's Date: 11/27/2013 Time: [id number] PT Time Calculation (min): 25 min  PT Assessment / Plan / Recommendation  History of Present Illness Pt admitted with acute hypercarbaric respiratory failure requiring intubation from 11/21/13-11/23/13.  Pt is a current smoker.  She has a hx of falls, most recently 2 weeks ago walking in ice, resulting in multiple facial and knee abrasions.  Pt also found to be protein malnourished.  Family voiced concerns with pt's poor eating and drinking habits prior to admission and resistance to seeking medical care.    PT Comments   Pt tolerated session, however limited by fatigue and decreased O2.  Follow Up Recommendations  SNF     Does the patient have the potential to tolerate intense rehabilitation     Barriers to Discharge        Equipment Recommendations  None recommended by PT    Recommendations for Other Services    Frequency Min 3X/week   Progress towards PT Goals Progress towards PT goals: Progressing toward goals  Plan Current plan remains appropriate    Precautions / Restrictions Precautions Precautions: Fall Restrictions Weight Bearing Restrictions: No   Pertinent Vitals/Pain See note below   Mobility  Transfers Overall transfer level: Needs assistance Equipment used: 1 person hand held assist Transfers: Sit to/from Stand Sit to Stand: Supervision Ambulation/Gait Ambulation/Gait assistance: Min assist Ambulation Distance (Feet): 30 Feet Assistive device: 1 person hand held assist Gait Pattern/deviations: WFL(Within Functional Limits) Gait velocity: decreased Gait velocity interpretation: Below normal speed for age/gender General Gait Details: unsteady, frequently reaching for UE support.  Impulsive at times needing max cues to prevent pulling lines    Exercises     PT Diagnosis:    PT Problem List:   PT Treatment Interventions:      PT Goals (current goals can now be found in the care plan section) Acute Rehab PT Goals Patient Stated Goal: get stronger PT Goal Formulation: With patient Time For Goal Achievement: 12/09/13 Potential to Achieve Goals: Good  Visit Information  Last PT Received On: 11/27/13 Assistance Needed: +1 History of Present Illness: Pt admitted with acute hypercarbaric respiratory failure requiring intubation from 11/21/13-11/23/13.  Pt is a current smoker.  She has a hx of falls, most recently 2 weeks ago walking in ice, resulting in multiple facial and knee abrasions.  Pt also found to be protein malnourished.  Family voiced concerns with pt's poor eating and drinking habits prior to admission and resistance to seeking medical care.     Subjective Data  Subjective: "I can walk a little." Patient Stated Goal: get stronger   Cognition  Cognition Arousal/Alertness: Awake/alert Behavior During Therapy: Flat affect Overall Cognitive Status: Impaired/Different from baseline Area of Impairment: Memory;Awareness;Problem solving Memory: Decreased short-term memory Awareness: Emergent Problem Solving: Decreased initiation;Requires verbal cues    Balance  Balance Overall balance assessment: Needs assistance;History of Falls Standing balance support: Single extremity supported;During functional activity Standing balance-Leahy Scale: Poor General Comments General comments (skin integrity, edema, etc.): O2 sats decreased to 79% on 2.5 L O2; increased O2 to 4L and educated on pursed lip breathing.  Pt. returned to 95% and amb 15 ft back to chair on 4L O2 remaining > 90%.  Slowly decreased O2 back to 2.5 L O2 and pt. tolerated well.  End of Session PT - End of Session Equipment Utilized During Treatment: Gait belt;Oxygen Activity Tolerance: Patient limited by fatigue Patient left: in chair;with call  bell/phone within reach Nurse Communication: Mobility status   GP     Moshe Cipro  K 11/27/2013, 10:56 AM  Clarita Crane, PT, DPT [phone]

## 2013-11-27 NOTE — Progress Notes (Signed)
PULMONARY / CRITICAL CARE MEDICINE  Name: Laurie Horne MRN: [id number] DOB: 1934/07/07    ADMISSION DATE:  11/21/2013  REFERRING MD :  EDP PRIMARY SERVICE: PCCM   CHIEF COMPLAINT:  Acute respiratory failure  BRIEF PATIENT DESCRIPTION:  80 yo smoker admitted with hypercarbic respiratory failure requiring intubation.  SIGNIFICANT EVENTS / STUDIES:  3/1  TTE >>> EF 60-65, no RWMA, mildly dilated RV with mildly decreased systolic function, PAP 48 torr 3/3 SLP Eval >>> recommend SLP Cognitive Evaluation as pt may require some level of rehab given prior level of independence.  LINES / TUBES: OETT 2/28 >>> 3/2 OGT 2/28 >>> 3/2 Foley 2/28 >>> 3/2  CULTURES: 2/28  Sputum >>> MSSA 2/28  Blood >>> 2/28  Urine >>> negative  ANTIBIOTICS: Unasyn 2/28 >>> 3/2 ( Unasyn shortage ) Levaquin 3/2 >>> Vanc 3/3 >>> 3/04  INTERVAL HISTORY:   Still feels weak, but getting stronger.  Not as much congestion.  VITAL SIGNS: Temp:  [98.1 F (36.7 C)-98.7 F (37.1 C)] 98.1 F (36.7 C) (03/06 0346) Pulse Rate:  [70-87] 70 (03/06 0346) Resp:  [16-18] 18 (03/06 0346) BP: (156-197)/(74-85) 156/80 mmHg (03/06 0346) SpO2:  [95 %-99 %] 95 % (03/06 0828) Weight:  [87 lb 8.4 oz (39.7 kg)] 87 lb 8.4 oz (39.7 kg) (03/06 0337)  INTAKE / OUTPUT: Intake/Output     03/05 0701 - 03/06 0700 03/06 0701 - 03/07 0700   P.O. 720    Total Intake(mL/kg) 720 (18.1)    Urine (mL/kg/hr) 3100 (3.3)    Total Output 3100     Net -2380          Urine Occurrence 1 x      PHYSICAL EXAMINATION: General: thin, sitting in chair Neuro: alert, follows commands HEENT: face/scalp abrasions, small hematoma middle forehead. Cardiovascular: regular Lungs: decreased breath sounds, scattered rhonchi, no wheeze Abdomen:  Soft, nontender Musculoskeletal: no edema Skin: multiple areas of ecchymosis  LABS:  CBC  Recent Labs Lab 11/25/13 0350 11/26/13 0234 11/27/13 0322  WBC 25.9* 22.0* 18.3*  HGB 12.7 13.5 14.5  HCT  40.8 44.2 46.2*  PLT 105* 102* 101*   Coag's  Recent Labs Lab 11/21/13 1550  APTT 31  INR 1.29   BMET  Recent Labs Lab 11/24/13 0240 11/25/13 0350 11/27/13 0322  NA 144 140 138  K 4.2 4.2 4.5  CL 105 101 91*  CO2 31 32 38*  BUN 23 13 8   CREATININE 0.53 0.49* 0.42*  GLUCOSE 110* 83 72   Electrolytes  Recent Labs Lab 11/21/13 1550  11/24/13 0240 11/25/13 0350 11/27/13 0322  CALCIUM 8.4  < > 8.8 8.7 8.8  MG 2.6*  --   --   --   --   PHOS 6.8*  --   --   --   --   < > = values in this interval not displayed.  Sepsis Markers  Recent Labs Lab 11/21/13 1550 11/21/13 1600  LATICACIDVEN 1.2 1.09  PROCALCITON 0.25  --    ABG  Recent Labs Lab 11/21/13 1733 11/22/13 0403  PHART 7.434 7.347*  PCO2ART 43.9 52.3*  PO2ART 72.0* 89.0   Liver Enzymes  Recent Labs Lab 11/21/13 1550 11/25/13 0350  AST 478* 68*  ALT 347* 138*  ALKPHOS 185* 119*  BILITOT 1.1 0.5  ALBUMIN 3.2* 2.4*   Cardiac Enzymes  Recent Labs Lab 11/21/13 1550 11/21/13 2002 11/22/13 0133  TROPONINI <0.30 <0.30 <0.30  PROBNP 16993.0*  --   --  Glucose  Recent Labs Lab 11/24/13 2127 11/25/13 0619 11/25/13 1100 11/25/13 1607 11/25/13 2121 11/26/13 0609  GLUCAP 90 97 97 159* 162* 81   ASSESSMENT / PLAN:  A:  Acute on chronic respiratory failure 2nd to AECOPD with S aureus in sputum. P:   Oxygen to keep SpO2 > 90% Continue duoneb Wean off prednisone as tolerated over next 1 week Day 7/7 Abx, currently on levaquin d/c after dose on 3/06  A:  Elevated BNP. Suspected cor pulmonale based on TTE results. Hx of HTN. P:  Resumed ziac, ASA 3/05 Hydralazine PRN Hold lisinopril for now >> re-assess as outpt  A:   Severe potein calorie malnutrition. Dysphagia. P:   Carb modified diet Ensure complete BID MVI daily F/u with speech therapy >> further therapy at SNF  A:   Acute encephalopathy, resolved. Severe deconditioning. P:   PT/OT >> recommending  SNF  Summary: Pt to be transferred to SNF later today.  Coralyn HellingVineet Meer Reindl, MD [facility] 11/27/2013, 10:07 AM Pager:  [phone] After 3pm call: [phone]

## 2013-11-27 NOTE — Progress Notes (Signed)
Transferred to [facility] via ambulance at 1600

## 2013-11-28 LAB — CULTURE, BLOOD (ROUTINE X 2)
CULTURE: NO GROWTH
Culture: NO GROWTH

## 2014-09-06 ENCOUNTER — Inpatient Hospital Stay (HOSPITAL_COMMUNITY)
Admission: EM | Admit: 2014-09-06 | Discharge: 2014-09-23 | DRG: 082 | Disposition: A | Discharge status: Skilled Nursing Facility | Payer: Medicare Other | Attending: Internal Medicine | Admitting: Internal Medicine

## 2014-09-06 DIAGNOSIS — Z0389 Encounter for observation for other suspected diseases and conditions ruled out: Secondary | ICD-10-CM

## 2014-09-06 DIAGNOSIS — F32A Depression, unspecified: Secondary | ICD-10-CM | POA: Diagnosis present

## 2014-09-06 DIAGNOSIS — R001 Bradycardia, unspecified: Secondary | ICD-10-CM | POA: Insufficient documentation

## 2014-09-06 DIAGNOSIS — S0630AA Unspecified focal traumatic brain injury with loss of consciousness status unknown, initial encounter: Secondary | ICD-10-CM | POA: Insufficient documentation

## 2014-09-06 DIAGNOSIS — R4182 Altered mental status, unspecified: Secondary | ICD-10-CM | POA: Diagnosis not present

## 2014-09-06 DIAGNOSIS — F419 Anxiety disorder, unspecified: Secondary | ICD-10-CM | POA: Diagnosis present

## 2014-09-06 DIAGNOSIS — R45851 Suicidal ideations: Secondary | ICD-10-CM | POA: Diagnosis not present

## 2014-09-06 DIAGNOSIS — E872 Acidosis: Secondary | ICD-10-CM | POA: Diagnosis present

## 2014-09-06 DIAGNOSIS — Z66 Do not resuscitate: Secondary | ICD-10-CM | POA: Diagnosis present

## 2014-09-06 DIAGNOSIS — L723 Sebaceous cyst: Secondary | ICD-10-CM | POA: Diagnosis present

## 2014-09-06 DIAGNOSIS — R197 Diarrhea, unspecified: Secondary | ICD-10-CM | POA: Insufficient documentation

## 2014-09-06 DIAGNOSIS — E222 Syndrome of inappropriate secretion of antidiuretic hormone: Secondary | ICD-10-CM | POA: Diagnosis present

## 2014-09-06 DIAGNOSIS — R1310 Dysphagia, unspecified: Secondary | ICD-10-CM

## 2014-09-06 DIAGNOSIS — E871 Hypo-osmolality and hyponatremia: Secondary | ICD-10-CM | POA: Insufficient documentation

## 2014-09-06 DIAGNOSIS — Z23 Encounter for immunization: Secondary | ICD-10-CM

## 2014-09-06 DIAGNOSIS — S06309A Unspecified focal traumatic brain injury with loss of consciousness of unspecified duration, initial encounter: Principal | ICD-10-CM | POA: Diagnosis present

## 2014-09-06 DIAGNOSIS — F411 Generalized anxiety disorder: Secondary | ICD-10-CM | POA: Insufficient documentation

## 2014-09-06 DIAGNOSIS — F329 Major depressive disorder, single episode, unspecified: Secondary | ICD-10-CM | POA: Diagnosis present

## 2014-09-06 DIAGNOSIS — T1491XA Suicide attempt, initial encounter: Secondary | ICD-10-CM | POA: Insufficient documentation

## 2014-09-06 DIAGNOSIS — Z9119 Patient's noncompliance with other medical treatment and regimen: Secondary | ICD-10-CM | POA: Diagnosis present

## 2014-09-06 DIAGNOSIS — R296 Repeated falls: Secondary | ICD-10-CM | POA: Diagnosis present

## 2014-09-06 DIAGNOSIS — I9589 Other hypotension: Secondary | ICD-10-CM | POA: Insufficient documentation

## 2014-09-06 DIAGNOSIS — I959 Hypotension, unspecified: Secondary | ICD-10-CM | POA: Diagnosis present

## 2014-09-06 DIAGNOSIS — E43 Unspecified severe protein-calorie malnutrition: Secondary | ICD-10-CM | POA: Diagnosis present

## 2014-09-06 DIAGNOSIS — Z7982 Long term (current) use of aspirin: Secondary | ICD-10-CM

## 2014-09-06 DIAGNOSIS — R404 Transient alteration of awareness: Secondary | ICD-10-CM

## 2014-09-06 DIAGNOSIS — Z9114 Patient's other noncompliance with medication regimen: Secondary | ICD-10-CM | POA: Diagnosis present

## 2014-09-06 DIAGNOSIS — Z9981 Dependence on supplemental oxygen: Secondary | ICD-10-CM

## 2014-09-06 DIAGNOSIS — S0181XA Laceration without foreign body of other part of head, initial encounter: Secondary | ICD-10-CM | POA: Diagnosis present

## 2014-09-06 DIAGNOSIS — K222 Esophageal obstruction: Secondary | ICD-10-CM

## 2014-09-06 DIAGNOSIS — E878 Other disorders of electrolyte and fluid balance, not elsewhere classified: Secondary | ICD-10-CM | POA: Diagnosis present

## 2014-09-06 DIAGNOSIS — G9341 Metabolic encephalopathy: Secondary | ICD-10-CM | POA: Insufficient documentation

## 2014-09-06 DIAGNOSIS — L989 Disorder of the skin and subcutaneous tissue, unspecified: Secondary | ICD-10-CM | POA: Insufficient documentation

## 2014-09-06 DIAGNOSIS — J449 Chronic obstructive pulmonary disease, unspecified: Secondary | ICD-10-CM | POA: Diagnosis present

## 2014-09-06 DIAGNOSIS — Z7952 Long term (current) use of systemic steroids: Secondary | ICD-10-CM

## 2014-09-06 DIAGNOSIS — E46 Unspecified protein-calorie malnutrition: Secondary | ICD-10-CM | POA: Insufficient documentation

## 2014-09-06 DIAGNOSIS — Z720 Tobacco use: Secondary | ICD-10-CM | POA: Insufficient documentation

## 2014-09-06 DIAGNOSIS — F17210 Nicotine dependence, cigarettes, uncomplicated: Secondary | ICD-10-CM | POA: Diagnosis present

## 2014-09-06 DIAGNOSIS — Z79899 Other long term (current) drug therapy: Secondary | ICD-10-CM

## 2014-09-06 DIAGNOSIS — E875 Hyperkalemia: Secondary | ICD-10-CM | POA: Insufficient documentation

## 2014-09-06 DIAGNOSIS — R1319 Other dysphagia: Secondary | ICD-10-CM

## 2014-09-06 DIAGNOSIS — J9622 Acute and chronic respiratory failure with hypercapnia: Secondary | ICD-10-CM | POA: Insufficient documentation

## 2014-09-06 DIAGNOSIS — T50901A Poisoning by unspecified drugs, medicaments and biological substances, accidental (unintentional), initial encounter: Secondary | ICD-10-CM | POA: Diagnosis present

## 2014-09-06 DIAGNOSIS — I10 Essential (primary) hypertension: Secondary | ICD-10-CM | POA: Insufficient documentation

## 2014-09-06 DIAGNOSIS — Z681 Body mass index (BMI) 19 or less, adult: Secondary | ICD-10-CM

## 2014-09-06 DIAGNOSIS — N390 Urinary tract infection, site not specified: Secondary | ICD-10-CM | POA: Diagnosis present

## 2014-09-06 DIAGNOSIS — T447X5A Adverse effect of beta-adrenoreceptor antagonists, initial encounter: Secondary | ICD-10-CM

## 2014-09-06 DIAGNOSIS — I619 Nontraumatic intracerebral hemorrhage, unspecified: Secondary | ICD-10-CM | POA: Diagnosis present

## 2014-09-06 DIAGNOSIS — R1312 Dysphagia, oropharyngeal phase: Secondary | ICD-10-CM | POA: Diagnosis present

## 2014-09-06 DIAGNOSIS — I629 Nontraumatic intracranial hemorrhage, unspecified: Secondary | ICD-10-CM

## 2014-09-06 NOTE — ED Provider Notes (Signed)
CSN: [id number]     Arrival date & time 09/06/14  2352 History  This chart was scribed for Olivia Mackie, MD by Luisa Dago, ED Scribe. This patient was seen in room [facility] and the patient's care was started at 11:58 PM.  Level 5 Caveat--Altered mental status  The history is provided by the EMS personnel and the spouse. No language interpreter was used.   HPI Comments: Laurie Horne is a 80 y.o. female who presents to the Emergency Department complaining of a possible accidental overdose that occurred just prior to arrival. As per EMS they were called by the pt's husband. When they arrived on the scene pt was non-responsive. Pt has been non-compliant with her medication, and has been taking her prescribed medications randomly. Tonight she took multiple beta-blockers.  Husband states that the pt has not been herself for a year now. Pt is on oxygen at home. Currently pt is awake and unsure of what is going on. Husband states that pt has been prone to falls these past weeks. Pt states tonight pt asked him to help her with her medicine. After taking the medicine under his supervision, the pt reported that she had taken her medication again. After a couple of hours pt started to appear confused. Husband states that he tried to find out what medication she had taken but he could not figure it out. He endorses a worsening in the pt's behavior for these past several months, however, he states that its been worse over the past 3 days. No chest pain, no SOB.   Past Medical History  Diagnosis Date  . Hyperlipidemia   . Hypertension   . History of tobacco abuse    Past Surgical History  Procedure Laterality Date  . Tonsillectomy and adenoidectomy    . Appendectomy    . Childbirth      x 3   No family history on file. History  Substance Use Topics  . Smoking status: Current Every Day Smoker -- 0.25 packs/day    Types: Cigarettes  . Smokeless tobacco: Not on file  . Alcohol Use: Not on file    OB History    No data available     Review of Systems  Unable to perform ROS: Mental status change  Neurological: Positive for syncope.  Psychiatric/Behavioral: Positive for confusion.   Allergies  Codeine  Home Medications   Prior to Admission medications   Medication Sig Start Date End Date Taking? Authorizing Provider  albuterol (PROVENTIL) (2.5 MG/3ML) 0.083% nebulizer solution Take 3 mLs (2.5 mg total) by nebulization every 3 (three) hours as needed for wheezing or shortness of breath. 11/27/13   Jeanella Craze, NP  aspirin 81 MG tablet Take 81 mg by mouth daily.    Historical Provider, MD  bisoprolol-hydrochlorothiazide [facility]) 2.5-6.25 MG per tablet 1 tablet po daily 01/26/11   Cassell Clement, MD  brinzolamide (AZOPT) 1 % ophthalmic suspension 1 drop. As directed    Historical Provider, MD  feeding supplement, ENSURE COMPLETE, (ENSURE COMPLETE) LIQD Take 237 mLs by mouth 2 (two) times daily between meals. 11/27/13   Jeanella Craze, NP  ipratropium-albuterol (DUONEB) 0.5-2.5 (3) MG/3ML SOLN Take 3 mLs by nebulization every 6 (six) hours. 11/27/13   Jeanella Craze, NP  meclizine (ANTIVERT) 12.5 MG tablet Take 12.5 mg by mouth 3 (three) times daily as needed for dizziness.    Historical Provider, MD  Multiple Vitamin (MULTIVITAMIN WITH MINERALS) TABS tablet Take 1 tablet by mouth daily.  11/27/13   Jeanella Craze, NP  predniSONE (DELTASONE) 10 MG tablet 2 tabs daily for 4 days, then 1 tab daily for 4 days, then stop 11/27/13   Jeanella Craze, NP  Travoprost, BAK Free, (TRAVATAN) 0.004 % SOLN ophthalmic solution 1 drop. As directed    Historical Provider, MD   BP 149/89 mmHg  Pulse 55  Temp(Src) 97.8 F (36.6 C) (Oral)  Resp 22  SpO2 100%   Physical Exam  Constitutional: She appears well-developed and well-nourished.  Frail elderly female, thin, confused  HENT:  Head: Normocephalic.  Right Ear: External ear normal.  Left Ear: External ear normal.  Mouth/Throat: Oropharynx is clear  and moist.  Old laceration with eschar to center of forehead.  Old abrasion to bridge of nose.  Dry mucous membranes  Cardiovascular: Normal rate, regular rhythm, normal heart sounds and intact distal pulses.  Exam reveals no gallop and no friction rub.   No murmur heard. Pulmonary/Chest: Effort normal. No respiratory distress. She has no wheezes. She has no rales. She exhibits no tenderness.  Prolonged expiratory phase.  Old laceration to center upper chest with eschar  Abdominal: Soft. Bowel sounds are normal. She exhibits no distension and no mass. There is no tenderness. There is no rebound and no guarding.  Musculoskeletal: Normal range of motion. She exhibits no edema or tenderness.  Skin tear right elbow  Neurological:  Confused, knows self, hospital  Skin: Skin is warm and dry.  Nursing note and vitals reviewed.   ED Course  Procedures (including critical care time)  DIAGNOSTIC STUDIES: Oxygen Saturation is 100% on RA, normal by my interpretation.    COORDINATION OF CARE: 12:04 AM- Pt is being monitored. 12:40 AM- Pt rechecked. She is stable. Husband and daughter are present at the bedside.   Labs Review Labs Reviewed  CBC WITH DIFFERENTIAL - Abnormal; Notable for the following:    WBC 11.0 (*)    RBC 5.13 (*)    Hemoglobin 15.2 (*)    HCT 48.6 (*)    Neutro Abs 8.3 (*)    Monocytes Absolute 1.1 (*)    All other components within normal limits  COMPREHENSIVE METABOLIC PANEL - Abnormal; Notable for the following:    Sodium 134 (*)    Chloride 85 (*)    CO2 44 (*)    Creatinine, Ser 0.47 (*)    Albumin 3.3 (*)    All other components within normal limits  URINALYSIS, ROUTINE W REFLEX MICROSCOPIC - Abnormal; Notable for the following:    APPearance CLOUDY (*)    Glucose, UA 100 (*)    Hgb urine dipstick SMALL (*)    Leukocytes, UA MODERATE (*)    All other components within normal limits  BLOOD GAS, ARTERIAL - Abnormal; Notable for the following:    pH, Arterial  7.260 (*)    pCO2 arterial 108.0 (*)    pO2, Arterial 140.0 (*)    Bicarbonate 46.7 (*)    Acid-Base Excess 19.0 (*)    All other components within normal limits  BLOOD GAS, ARTERIAL - Abnormal; Notable for the following:    pH, Arterial 7.263 (*)    pCO2 arterial 93.7 (*)    pO2, Arterial 106.0 (*)    Bicarbonate 40.9 (*)    Acid-Base Excess 13.6 (*)    All other components within normal limits  URINE MICROSCOPIC-ADD ON - Abnormal; Notable for the following:    Squamous Epithelial / LPF MANY (*)    Bacteria,  UA MANY (*)    All other components within normal limits  I-STAT CHEM 8, ED - Abnormal; Notable for the following:    Sodium 129 (*)    Chloride 78 (*)    Hemoglobin 18.0 (*)    HCT 53.0 (*)    All other components within normal limits  I-STAT ARTERIAL BLOOD GAS, ED - Abnormal; Notable for the following:    pH, Arterial 7.259 (*)    pCO2 arterial 97.9 (*)    pO2, Arterial 115.0 (*)    Bicarbonate 43.8 (*)    Acid-Base Excess 11.0 (*)    All other components within normal limits  CULTURE, BLOOD (ROUTINE X 2)  CULTURE, BLOOD (ROUTINE X 2)  URINE CULTURE  BASIC METABOLIC PANEL  CBC  PROTIME-INR  BLOOD GAS, ARTERIAL  TROPONIN I  TROPONIN I  TROPONIN I  CBG MONITORING, ED  I-STAT CG4 LACTIC ACID, ED  I-STAT TROPOININ, ED    Imaging Review Ct Head Wo Contrast  09/07/2014   CLINICAL DATA:  Altered mental status  EXAM: CT HEAD WITHOUT CONTRAST  TECHNIQUE: Contiguous axial images were obtained from the base of the skull through the vertex without intravenous contrast.  COMPARISON:  11/21/2013  FINDINGS: Skull and Sinuses:There is central forehead contusion without underlying fracture.  Orbits: No acute abnormality.  Brain: New high-density present within the atria of the lateral ventricles and within the right lateral ventricle body. The hemorrhage is small volume, with the largest hematoma measuring 17 mm (in the right lateral ventricular body). There is no hydrocephalus  or subarachnoid extension. No brain swelling or evidence of infarct.  Generalized cerebral volume loss which typical for age. Diffuse, patchy cerebral white matter low density which is similar to previous and likely from chronic small vessel disease.  Critical Value/emergent results were called by telephone at the time of interpretation on 09/07/2014 at 2:54 am to Dr. Marisa SeverinLGA Steffen Hase , who verbally acknowledged these results.  IMPRESSION: 1. Acute hemorrhage in the lateral ventricles without hydrocephalus. 2. Forehead contusion without fracture. 3. Chronic white matter disease and mild brain atrophy.   Electronically Signed   By: Tiburcio PeaJonathan  Watts M.D.   On: 09/07/2014 02:54   Dg Chest Port 1 View  09/07/2014   CLINICAL DATA:  Transient altered awareness.  EXAM: PORTABLE CHEST - 1 VIEW  COMPARISON:  November 23, 2013.  FINDINGS: Stable cardiomegaly. No pneumothorax or pleural effusion is noted. Both lungs are clear. The visualized skeletal structures are unremarkable.  IMPRESSION: No acute cardiopulmonary abnormality seen.   Electronically Signed   By: Roque LiasJames  Green M.D.   On: 09/07/2014 02:12     EKG Interpretation   Date/Time:  Tuesday September 07 2014 00:14:14 EST Ventricular Rate:  52 PR Interval:  185 QRS Duration: 86 QT Interval:  423 QTC Calculation: 393 R Axis:   83 Text Interpretation:  Sinus rhythm Atrial premature complexes Probable  left atrial enlargement Borderline right axis deviation Probable left  ventricular hypertrophy Anterior Q waves, possibly due to LVH Reconfirmed  by Stayce Delancy  MD, Azrael Huss ([id number]) on 09/07/2014 4:04:56 AM      CRITICAL CARE Performed by: Olivia MackieTTER,Jesica Goheen M Total critical care time: 60 min Critical care time was exclusive of separately billable procedures and treating other patients. Critical care was necessary to treat or prevent imminent or life-threatening deterioration. Critical care was time spent personally by me on the following activities: development of treatment  plan with patient and/or surrogate as well as nursing, discussions with consultants, evaluation  of patient's response to treatment, examination of patient, obtaining history from patient or surrogate, ordering and performing treatments and interventions, ordering and review of laboratory studies, ordering and review of radiographic studies, pulse oximetry and re-evaluation of patient's condition.  MDM   Final diagnoses:  Altered awareness, transient  Altered mental status  Acute on chronic respiratory failure with hypercapnia  Hyponatremia  Adverse reaction to beta-blocker, initial encounter  Intracranial bleeding    80 yo female with episode of altered mental status at home, now awake alert.  Possible accidental/intentional overdose of medications (top to bisopropol/hctz off, unknown tabs in bottle), has had multiple falls recently, has poor nutritional status, questionable dementia.  AMS most likely multifactorial.  Expect admission, and family requests discharge to nursing facility.  I personally performed the services described in this documentation, which was scribed in my presence. The recorded information has been reviewed and is accurate.  ABG noted, will start patient on bipap.  Case d/w Dr Darrick Pennaeterding, who wishes patient to be monitored for an hour on bipap, and if PCO2 improving, may be ok for hospitalist.  Pt with hypotension, bradycardia now.  Concern for real beta blocker ingestion, will give 2 mg of glucagon.    Pt with improvement in vitals with glucagon, no vomiting.  Pt somewhat more alert.  3:06 AM Pt was to be intubated for apneic periods, but now is opening eyes, looking around, responding to commands.  Will repeat abg.  Awaiting critical care eval.   Case d/w Dr Jordan LikesPool with nsgy, he has reviewed the scans and will patient in the AM     Olivia Mackielga M Brecken Walth, MD 09/07/14 [phone]

## 2014-09-07 ENCOUNTER — Encounter (HOSPITAL_COMMUNITY): Payer: Self-pay | Admitting: Emergency Medicine

## 2014-09-07 ENCOUNTER — Emergency Department (HOSPITAL_COMMUNITY): Payer: Medicare Other

## 2014-09-07 DIAGNOSIS — Z9114 Patient's other noncompliance with medication regimen: Secondary | ICD-10-CM | POA: Diagnosis not present

## 2014-09-07 DIAGNOSIS — J9622 Acute and chronic respiratory failure with hypercapnia: Secondary | ICD-10-CM

## 2014-09-07 DIAGNOSIS — Z7982 Long term (current) use of aspirin: Secondary | ICD-10-CM | POA: Diagnosis not present

## 2014-09-07 DIAGNOSIS — Z79899 Other long term (current) drug therapy: Secondary | ICD-10-CM | POA: Diagnosis not present

## 2014-09-07 DIAGNOSIS — E43 Unspecified severe protein-calorie malnutrition: Secondary | ICD-10-CM | POA: Diagnosis present

## 2014-09-07 DIAGNOSIS — I10 Essential (primary) hypertension: Secondary | ICD-10-CM | POA: Diagnosis not present

## 2014-09-07 DIAGNOSIS — Z66 Do not resuscitate: Secondary | ICD-10-CM | POA: Diagnosis not present

## 2014-09-07 DIAGNOSIS — Z681 Body mass index (BMI) 19 or less, adult: Secondary | ICD-10-CM | POA: Diagnosis not present

## 2014-09-07 DIAGNOSIS — R4182 Altered mental status, unspecified: Secondary | ICD-10-CM | POA: Diagnosis present

## 2014-09-07 DIAGNOSIS — E872 Acidosis: Secondary | ICD-10-CM | POA: Diagnosis not present

## 2014-09-07 DIAGNOSIS — I959 Hypotension, unspecified: Secondary | ICD-10-CM | POA: Diagnosis not present

## 2014-09-07 DIAGNOSIS — J449 Chronic obstructive pulmonary disease, unspecified: Secondary | ICD-10-CM | POA: Diagnosis not present

## 2014-09-07 DIAGNOSIS — S06369A Traumatic hemorrhage of cerebrum, unspecified, with loss of consciousness of unspecified duration, initial encounter: Secondary | ICD-10-CM

## 2014-09-07 DIAGNOSIS — G9341 Metabolic encephalopathy: Secondary | ICD-10-CM | POA: Diagnosis present

## 2014-09-07 DIAGNOSIS — R296 Repeated falls: Secondary | ICD-10-CM | POA: Diagnosis not present

## 2014-09-07 DIAGNOSIS — N390 Urinary tract infection, site not specified: Secondary | ICD-10-CM | POA: Diagnosis not present

## 2014-09-07 DIAGNOSIS — E222 Syndrome of inappropriate secretion of antidiuretic hormone: Secondary | ICD-10-CM | POA: Diagnosis not present

## 2014-09-07 DIAGNOSIS — E871 Hypo-osmolality and hyponatremia: Secondary | ICD-10-CM | POA: Diagnosis not present

## 2014-09-07 DIAGNOSIS — E878 Other disorders of electrolyte and fluid balance, not elsewhere classified: Secondary | ICD-10-CM | POA: Diagnosis not present

## 2014-09-07 DIAGNOSIS — S0181XA Laceration without foreign body of other part of head, initial encounter: Secondary | ICD-10-CM | POA: Diagnosis not present

## 2014-09-07 DIAGNOSIS — Z7952 Long term (current) use of systemic steroids: Secondary | ICD-10-CM | POA: Diagnosis not present

## 2014-09-07 DIAGNOSIS — S06309A Unspecified focal traumatic brain injury with loss of consciousness of unspecified duration, initial encounter: Secondary | ICD-10-CM | POA: Diagnosis present

## 2014-09-07 DIAGNOSIS — Z9981 Dependence on supplemental oxygen: Secondary | ICD-10-CM | POA: Diagnosis not present

## 2014-09-07 DIAGNOSIS — F411 Generalized anxiety disorder: Secondary | ICD-10-CM | POA: Diagnosis not present

## 2014-09-07 DIAGNOSIS — L723 Sebaceous cyst: Secondary | ICD-10-CM | POA: Diagnosis not present

## 2014-09-07 DIAGNOSIS — R1312 Dysphagia, oropharyngeal phase: Secondary | ICD-10-CM | POA: Diagnosis not present

## 2014-09-07 DIAGNOSIS — Z0389 Encounter for observation for other suspected diseases and conditions ruled out: Secondary | ICD-10-CM | POA: Diagnosis not present

## 2014-09-07 DIAGNOSIS — R001 Bradycardia, unspecified: Secondary | ICD-10-CM | POA: Diagnosis not present

## 2014-09-07 DIAGNOSIS — E875 Hyperkalemia: Secondary | ICD-10-CM | POA: Diagnosis not present

## 2014-09-07 DIAGNOSIS — Z9119 Patient's noncompliance with other medical treatment and regimen: Secondary | ICD-10-CM | POA: Diagnosis not present

## 2014-09-07 DIAGNOSIS — F17210 Nicotine dependence, cigarettes, uncomplicated: Secondary | ICD-10-CM | POA: Diagnosis not present

## 2014-09-07 DIAGNOSIS — T50901A Poisoning by unspecified drugs, medicaments and biological substances, accidental (unintentional), initial encounter: Secondary | ICD-10-CM | POA: Diagnosis not present

## 2014-09-07 DIAGNOSIS — R45851 Suicidal ideations: Secondary | ICD-10-CM | POA: Diagnosis not present

## 2014-09-07 DIAGNOSIS — I619 Nontraumatic intracerebral hemorrhage, unspecified: Secondary | ICD-10-CM | POA: Diagnosis present

## 2014-09-07 DIAGNOSIS — K222 Esophageal obstruction: Secondary | ICD-10-CM | POA: Diagnosis not present

## 2014-09-07 LAB — BLOOD GAS, ARTERIAL
ACID-BASE EXCESS: 13.6 mmol/L — AB (ref 0.0–2.0)
Acid-Base Excess: 19 mmol/L — ABNORMAL HIGH (ref 0.0–2.0)
BICARBONATE: 40.9 meq/L — AB (ref 20.0–24.0)
Bicarbonate: 46.7 mEq/L — ABNORMAL HIGH (ref 20.0–24.0)
DRAWN BY: 41881
Drawn by: 41881
Expiratory PAP: 8
FIO2: 0.4 %
Inspiratory PAP: 20
Mode: POSITIVE
O2 CONTENT: 2.5 L/min
O2 SAT: 98.5 %
O2 SAT: 97.5 %
PATIENT TEMPERATURE: 98.6
PO2 ART: 106 mmHg — AB (ref 80.0–100.0)
Patient temperature: 98.6
TCO2: 50 mmol/L (ref 0–100)
TCO2: 43.8 mmol/L (ref 0–100)
pCO2 arterial: 108 mmHg (ref 35.0–45.0)
pCO2 arterial: 93.7 mmHg (ref 35.0–45.0)
pH, Arterial: 7.26 — ABNORMAL LOW (ref 7.350–7.450)
pH, Arterial: 7.263 — ABNORMAL LOW (ref 7.350–7.450)
pO2, Arterial: 140 mmHg — ABNORMAL HIGH (ref 80.0–100.0)

## 2014-09-07 LAB — CBC WITH DIFFERENTIAL/PLATELET
BASOS ABS: 0 10*3/uL (ref 0.0–0.1)
Basophils Relative: 0 % (ref 0–1)
Eosinophils Absolute: 0 10*3/uL (ref 0.0–0.7)
Eosinophils Relative: 0 % (ref 0–5)
HCT: 48.6 % — ABNORMAL HIGH (ref 36.0–46.0)
Hemoglobin: 15.2 g/dL — ABNORMAL HIGH (ref 12.0–15.0)
Lymphocytes Relative: 14 % (ref 12–46)
Lymphs Abs: 1.6 10*3/uL (ref 0.7–4.0)
MCH: 29.6 pg (ref 26.0–34.0)
MCHC: 31.3 g/dL (ref 30.0–36.0)
MCV: 94.7 fL (ref 78.0–100.0)
MONO ABS: 1.1 10*3/uL — AB (ref 0.1–1.0)
Monocytes Relative: 10 % (ref 3–12)
NEUTROS ABS: 8.3 10*3/uL — AB (ref 1.7–7.7)
NEUTROS PCT: 76 % (ref 43–77)
PLATELETS: 185 10*3/uL (ref 150–400)
RBC: 5.13 MIL/uL — ABNORMAL HIGH (ref 3.87–5.11)
RDW: 12.7 % (ref 11.5–15.5)
WBC: 11 10*3/uL — AB (ref 4.0–10.5)

## 2014-09-07 LAB — BASIC METABOLIC PANEL
Anion gap: 7 (ref 5–15)
BUN: 8 mg/dL (ref 6–23)
CHLORIDE: 88 meq/L — AB (ref 96–112)
CO2: 36 mEq/L — ABNORMAL HIGH (ref 19–32)
Calcium: 8.9 mg/dL (ref 8.4–10.5)
Creatinine, Ser: 0.34 mg/dL — ABNORMAL LOW (ref 0.50–1.10)
GFR calc Af Amer: >90 mL/min (ref >90)
GFR calc non Af Amer: >90 mL/min (ref >90)
GLUCOSE: 113 mg/dL — AB (ref 70–99)
POTASSIUM: 4.3 meq/L (ref 3.7–5.3)
Sodium: 131 mEq/L — ABNORMAL LOW (ref 137–147)

## 2014-09-07 LAB — I-STAT CHEM 8, ED
BUN: 8 mg/dL (ref 6–23)
CHLORIDE: 78 meq/L — AB (ref 96–112)
Calcium, Ion: 1.16 mmol/L (ref 1.13–1.30)
Creatinine, Ser: 0.7 mg/dL (ref 0.50–1.10)
Glucose, Bld: 88 mg/dL (ref 70–99)
HCT: 53 % — ABNORMAL HIGH (ref 36.0–46.0)
Hemoglobin: 18 g/dL — ABNORMAL HIGH (ref 12.0–15.0)
POTASSIUM: 3.7 meq/L (ref 3.7–5.3)
Sodium: 129 mEq/L — ABNORMAL LOW (ref 137–147)
TCO2: 43 mmol/L (ref 0–100)

## 2014-09-07 LAB — I-STAT ARTERIAL BLOOD GAS, ED
Acid-Base Excess: 11 mmol/L — ABNORMAL HIGH (ref 0.0–2.0)
Bicarbonate: 43.8 mEq/L — ABNORMAL HIGH (ref 20.0–24.0)
O2 SAT: 97 %
PCO2 ART: 97.9 mmHg — AB (ref 35.0–45.0)
PH ART: 7.259 — AB (ref 7.350–7.450)
PO2 ART: 115 mmHg — AB (ref 80.0–100.0)
Patient temperature: 98.6
TCO2: 47 mmol/L (ref 0–100)

## 2014-09-07 LAB — COMPREHENSIVE METABOLIC PANEL
ALBUMIN: 3.3 g/dL — AB (ref 3.5–5.2)
ALT: 9 U/L (ref 0–35)
AST: 18 U/L (ref 0–37)
Alkaline Phosphatase: 99 U/L (ref 39–117)
Anion gap: 5 (ref 5–15)
BILIRUBIN TOTAL: 0.3 mg/dL (ref 0.3–1.2)
BUN: 8 mg/dL (ref 6–23)
CHLORIDE: 85 meq/L — AB (ref 96–112)
CO2: 44 mEq/L (ref 19–32)
CREATININE: 0.47 mg/dL — AB (ref 0.50–1.10)
Calcium: 9.6 mg/dL (ref 8.4–10.5)
GFR calc Af Amer: >90 mL/min (ref >90)
GFR calc non Af Amer: >90 mL/min (ref >90)
Glucose, Bld: 91 mg/dL (ref 70–99)
Potassium: 3.9 mEq/L (ref 3.7–5.3)
SODIUM: 134 meq/L — AB (ref 137–147)
Total Protein: 6.8 g/dL (ref 6.0–8.3)

## 2014-09-07 LAB — URINALYSIS, ROUTINE W REFLEX MICROSCOPIC
Bilirubin Urine: NEGATIVE
GLUCOSE, UA: 100 mg/dL — AB
KETONES UR: NEGATIVE mg/dL
Nitrite: NEGATIVE
PH: 6.5 (ref 5.0–8.0)
Protein, ur: NEGATIVE mg/dL
Specific Gravity, Urine: 1.013 (ref 1.005–1.030)
Urobilinogen, UA: 0.2 mg/dL (ref 0.0–1.0)

## 2014-09-07 LAB — TROPONIN I: Troponin I: <0.3 ng/mL (ref <0.30)

## 2014-09-07 LAB — I-STAT CG4 LACTIC ACID, ED: LACTIC ACID, VENOUS: 0.71 mmol/L (ref 0.5–2.2)

## 2014-09-07 LAB — CBC
HEMATOCRIT: 48.2 % — AB (ref 36.0–46.0)
HEMOGLOBIN: 14.9 g/dL (ref 12.0–15.0)
MCH: 29.3 pg (ref 26.0–34.0)
MCHC: 30.9 g/dL (ref 30.0–36.0)
MCV: 94.7 fL (ref 78.0–100.0)
Platelets: 144 10*3/uL — ABNORMAL LOW (ref 150–400)
RBC: 5.09 MIL/uL (ref 3.87–5.11)
RDW: 12.7 % (ref 11.5–15.5)
WBC: 8.4 10*3/uL (ref 4.0–10.5)

## 2014-09-07 LAB — URINE MICROSCOPIC-ADD ON

## 2014-09-07 LAB — PROTIME-INR
INR: 1.05 (ref 0.00–1.49)
Prothrombin Time: 13.8 seconds (ref 11.6–15.2)

## 2014-09-07 LAB — MRSA PCR SCREENING: MRSA BY PCR: NEGATIVE

## 2014-09-07 LAB — I-STAT TROPONIN, ED: Troponin i, poc: 0 ng/mL (ref 0.00–0.08)

## 2014-09-07 LAB — CBG MONITORING, ED: GLUCOSE-CAPILLARY: 86 mg/dL (ref 70–99)

## 2014-09-07 MED ORDER — ETOMIDATE 2 MG/ML IV SOLN
INTRAVENOUS | Status: AC
  Filled 2014-09-07: qty 20

## 2014-09-07 MED ORDER — CHLORHEXIDINE GLUCONATE 0.12 % MT SOLN
15.0000 mL | Freq: Two times a day (BID) | OROMUCOSAL | Status: DC
  Administered 2014-09-07 – 2014-09-23 (×31): 15 mL via OROMUCOSAL
  Filled 2014-09-07 (×31): qty 15

## 2014-09-07 MED ORDER — ALBUTEROL SULFATE (2.5 MG/3ML) 0.083% IN NEBU: 2.5000 mg | INHALATION_SOLUTION | RESPIRATORY_TRACT | Status: DC | PRN

## 2014-09-07 MED ORDER — SUCCINYLCHOLINE CHLORIDE 20 MG/ML IJ SOLN
INTRAMUSCULAR | Status: AC
  Filled 2014-09-07: qty 1

## 2014-09-07 MED ORDER — CETYLPYRIDINIUM CHLORIDE 0.05 % MT LIQD
7.0000 mL | Freq: Two times a day (BID) | OROMUCOSAL | Status: DC
  Administered 2014-09-07 – 2014-09-22 (×26): 7 mL via OROMUCOSAL

## 2014-09-07 MED ORDER — DEXTROSE-NACL 5-0.9 % IV SOLN
INTRAVENOUS | Status: DC
  Administered 2014-09-07: 14:00:00 via INTRAVENOUS

## 2014-09-07 MED ORDER — PANTOPRAZOLE SODIUM 40 MG IV SOLR: 40.0000 mg | Freq: Every day | INTRAVENOUS | Status: DC

## 2014-09-07 MED ORDER — LIDOCAINE HCL (CARDIAC) 20 MG/ML IV SOLN
INTRAVENOUS | Status: AC
Start: 2014-09-07 — End: 2014-09-07
  Filled 2014-09-07: qty 5

## 2014-09-07 MED ORDER — BISOPROLOL-HYDROCHLOROTHIAZIDE 2.5-6.25 MG PO TABS
1.0000 | ORAL_TABLET | Freq: Every day | ORAL | Status: DC
  Administered 2014-09-07 – 2014-09-08 (×2): 1 via ORAL
  Filled 2014-09-07 (×2): qty 1

## 2014-09-07 MED ORDER — PNEUMOCOCCAL VAC POLYVALENT 25 MCG/0.5ML IJ INJ
0.5000 mL | INJECTION | INTRAMUSCULAR | Status: AC
  Administered 2014-09-08: 0.5 mL via INTRAMUSCULAR
  Filled 2014-09-07: qty 0.5

## 2014-09-07 MED ORDER — SODIUM CHLORIDE 0.9 % IV BOLUS (SEPSIS)
1000.0000 mL | Freq: Once | INTRAVENOUS | Status: AC
  Administered 2014-09-07: 1000 mL via INTRAVENOUS

## 2014-09-07 MED ORDER — SODIUM CHLORIDE 0.9 % IV SOLN: INTRAVENOUS | Status: DC

## 2014-09-07 MED ORDER — ROCURONIUM BROMIDE 50 MG/5ML IV SOLN
INTRAVENOUS | Status: AC
  Filled 2014-09-07: qty 2

## 2014-09-07 MED ORDER — GLUCAGON HCL RDNA (DIAGNOSTIC) 1 MG IJ SOLR
1.0000 mg | Freq: Once | INTRAMUSCULAR | Status: AC
  Administered 2014-09-07: 1 mg via INTRAVENOUS

## 2014-09-07 MED ORDER — SODIUM CHLORIDE 0.9 % IV SOLN: 250.0000 mL | INTRAVENOUS | Status: DC | PRN

## 2014-09-07 MED ORDER — GLUCAGON HCL RDNA (DIAGNOSTIC) 1 MG IJ SOLR
2.0000 mg | Freq: Once | INTRAMUSCULAR | Status: AC
  Administered 2014-09-07: 2 mg via INTRAVENOUS

## 2014-09-07 MED ORDER — GLUCAGON HCL RDNA (DIAGNOSTIC) 1 MG IJ SOLR
INTRAMUSCULAR | Status: AC
  Filled 2014-09-07: qty 2

## 2014-09-07 MED ORDER — ALPRAZOLAM 0.25 MG PO TABS
0.2500 mg | ORAL_TABLET | Freq: Three times a day (TID) | ORAL | Status: DC | PRN
  Administered 2014-09-07 – 2014-09-11 (×5): 0.25 mg via ORAL
  Filled 2014-09-07 (×6): qty 1

## 2014-09-07 MED ORDER — GLUCAGON HCL RDNA (DIAGNOSTIC) 1 MG IJ SOLR
INTRAMUSCULAR | Status: AC
  Filled 2014-09-07: qty 1

## 2014-09-07 NOTE — ED Notes (Signed)
Pt to ED via [facility].  Pt's husband st's pt was on the couch when he went to give pt her night time meds he could not wake her up.  On EMS arrival pt was unresponsive but would arouse to painful stimuli but then go back to sleep.  On arrival to ED pt was alert and disoriented.  EMS reports pt's husband st's pt has not been herself for past year.

## 2014-09-07 NOTE — Consult Note (Signed)
Reason for Consult: Intra-ventricular hemorrhage Referring Physician: Emergency department  Laurie Horne is an 80 y.o. female.  HPI: 80 year old female with history of respiratory failure possibly secondary to medication overdose. Patient with history of significant respiratory failure and status post a number of mechanical falls and/or syncopal falls at home. No history of weakness. No history of numbness, paresthesias or seizure. Patient had screenings head CT scan done and emergency department demonstrating small amount of intraventricular blood within her right lateral ventricle without evidence of significant parenchymal injury or other abnormality.  Past Medical History  Diagnosis Date  . Hyperlipidemia   . Hypertension   . History of tobacco abuse     Past Surgical History  Procedure Laterality Date  . Tonsillectomy and adenoidectomy    . Appendectomy    . Childbirth      x 3    No family history on file.  Social History:  reports that she has been smoking Cigarettes.  She has been smoking about 0.25 packs per day. She does not have any smokeless tobacco history on file. She reports that she does not drink alcohol. Her drug history is not on file.  Allergies:  Allergies  Allergen Reactions  . Codeine Other (See Comments)    unknown    Medications: I have reviewed the patient's current medications.  Results for orders placed or performed during the hospital encounter of 09/06/14 (from the past 48 hour(s))  CBC with Differential     Status: Abnormal   Collection Time: 09/07/14 12:15 AM  Result Value Ref Range   WBC 11.0 (H) 4.0 - 10.5 K/uL   RBC 5.13 (H) 3.87 - 5.11 MIL/uL   Hemoglobin 15.2 (H) 12.0 - 15.0 g/dL   HCT 48.6 (H) 36.0 - 46.0 %   MCV 94.7 78.0 - 100.0 fL   MCH 29.6 26.0 - 34.0 pg   MCHC 31.3 30.0 - 36.0 g/dL   RDW 12.7 11.5 - 15.5 %   Platelets 185 150 - 400 K/uL   Neutrophils Relative % 76 43 - 77 %   Neutro Abs 8.3 (H) 1.7 - 7.7 K/uL   Lymphocytes  Relative 14 12 - 46 %   Lymphs Abs 1.6 0.7 - 4.0 K/uL   Monocytes Relative 10 3 - 12 %   Monocytes Absolute 1.1 (H) 0.1 - 1.0 K/uL   Eosinophils Relative 0 0 - 5 %   Eosinophils Absolute 0.0 0.0 - 0.7 K/uL   Basophils Relative 0 0 - 1 %   Basophils Absolute 0.0 0.0 - 0.1 K/uL  Comprehensive metabolic panel     Status: Abnormal   Collection Time: 09/07/14 12:15 AM  Result Value Ref Range   Sodium 134 (L) 137 - 147 mEq/L   Potassium 3.9 3.7 - 5.3 mEq/L   Chloride 85 (L) 96 - 112 mEq/L   CO2 44 (HH) 19 - 32 mEq/L    Comment: CRITICAL RESULT CALLED TO, READ BACK BY AND VERIFIED WITH: FIELDS K,RN 09/07/14 0105 WAYK    Glucose, Bld 91 70 - 99 mg/dL   BUN 8 6 - 23 mg/dL   Creatinine, Ser 0.47 (L) 0.50 - 1.10 mg/dL   Calcium 9.6 8.4 - 10.5 mg/dL   Total Protein 6.8 6.0 - 8.3 g/dL   Albumin 3.3 (L) 3.5 - 5.2 g/dL   AST 18 0 - 37 U/L   ALT 9 0 - 35 U/L   Alkaline Phosphatase 99 39 - 117 U/L   Total Bilirubin 0.3 0.3 -  1.2 mg/dL   GFR calc non Af Amer >90 >90 mL/min   GFR calc Af Amer >90 >90 mL/min    Comment: (NOTE) The eGFR has been calculated using the CKD EPI equation. This calculation has not been validated in all clinical situations. eGFR's persistently <90 mL/min signify possible Chronic Kidney Disease.    Anion gap 5 5 - 15  I-stat troponin, ED     Status: None   Collection Time: 09/07/14 12:18 AM  Result Value Ref Range   Troponin i, poc 0.00 0.00 - 0.08 ng/mL   Comment 3            Comment: Due to the release kinetics of cTnI, a negative result within the first hours of the onset of symptoms does not rule out myocardial infarction with certainty. If myocardial infarction is still suspected, repeat the test at appropriate intervals.   CBG monitoring, ED     Status: None   Collection Time: 09/07/14 12:19 AM  Result Value Ref Range   Glucose-Capillary 86 70 - 99 mg/dL  I-Stat CG4 Lactic Acid, ED     Status: None   Collection Time: 09/07/14 12:20 AM  Result Value  Ref Range   Lactic Acid, Venous 0.71 0.5 - 2.2 mmol/L  I-Stat Chem 8, ED     Status: Abnormal   Collection Time: 09/07/14 12:32 AM  Result Value Ref Range   Sodium 129 (L) 137 - 147 mEq/L   Potassium 3.7 3.7 - 5.3 mEq/L   Chloride 78 (L) 96 - 112 mEq/L   BUN 8 6 - 23 mg/dL   Creatinine, Ser 0.70 0.50 - 1.10 mg/dL   Glucose, Bld 88 70 - 99 mg/dL   Calcium, Ion 1.16 1.13 - 1.30 mmol/L   TCO2 43 0 - 100 mmol/L   Hemoglobin 18.0 (H) 12.0 - 15.0 g/dL   HCT 53.0 (H) 36.0 - 46.0 %   Comment NOTIFIED PHYSICIAN   Blood gas, arterial     Status: Abnormal   Collection Time: 09/07/14  1:00 AM  Result Value Ref Range   O2 Content 2.5 L/min   Delivery systems NASAL CANNULA    pH, Arterial 7.260 (L) 7.350 - 7.450   pCO2 arterial 108.0 (HH) 35.0 - 45.0 mmHg    Comment: CRITICAL RESULT CALLED TO, READ BACK BY AND VERIFIED WITH:  NICOLE MILLER, RT AT 0121 BY T COCKMAN, RT ON 12 CRITICAL RESULT CALLED TO, READ BACK BY AND VERIFIED WITH: NICOLE MILLER, RT AT 0121 BY T COCKMAN RT ON _0 pO2, Arterial 140.0 (H) 80.0 - 100.0 mmHg   Bicarbonate 46.7 (H) 20.0 - 24.0 mEq/L   TCO2 50.0 0 - 100 mmol/L   Acid-Base Excess 19.0 (H) 0.0 - 2.0 mmol/L   O2 Saturation 98.5 %   Patient temperature 98.6    Collection site RIGHT RADIAL    Drawn by [phone]    Sample type ARTERIAL DRAW    Allens test (pass/fail) PASS PASS  Blood gas, arterial     Status: Abnormal   Collection Time: 09/07/14  3:27 AM  Result Value Ref Range   FIO2 0.40 %   Delivery systems VENTILATOR    Mode BILEVEL POSITIVE AIRWAY PRESSURE    Inspiratory PAP 20    Expiratory PAP 8    Pressure support 12 cm H20   pH, Arterial 7.263 (L) 7.350 - 7.450   pCO2 arterial 93.7 (HH) 35.0 - 45.0 mmHg    Comment: CRITICAL RESULT  CALLED TO, READ BACK BY AND VERIFIED WITH: NICOLE MILLER, RT AT 335 BY T COCKMAN RT ON12 15 15    pO2, Arterial 106.0 (H) 80.0 - 100.0 mmHg   Bicarbonate 40.9 (H) 20.0 - 24.0 mEq/L   TCO2 43.8 0 - 100 mmol/L    Acid-Base Excess 13.6 (H) 0.0 - 2.0 mmol/L   O2 Saturation 97.5 %   Patient temperature 98.6    Collection site RIGHT RADIAL    Drawn by [phone]    Sample type ARTERIAL DRAW    Allens test (pass/fail) PASS PASS  Urinalysis, Routine w reflex microscopic     Status: Abnormal   Collection Time: 09/07/14  4:00 AM  Result Value Ref Range   Color, Urine YELLOW YELLOW   APPearance CLOUDY (A) CLEAR   Specific Gravity, Urine 1.013 1.005 - 1.030   pH 6.5 5.0 - 8.0   Glucose, UA 100 (A) NEGATIVE mg/dL   Hgb urine dipstick SMALL (A) NEGATIVE   Bilirubin Urine NEGATIVE NEGATIVE   Ketones, ur NEGATIVE NEGATIVE mg/dL   Protein, ur NEGATIVE NEGATIVE mg/dL   Urobilinogen, UA 0.2 0.0 - 1.0 mg/dL   Nitrite NEGATIVE NEGATIVE   Leukocytes, UA MODERATE (A) NEGATIVE  Urine microscopic-add on     Status: Abnormal   Collection Time: 09/07/14  4:00 AM  Result Value Ref Range   Squamous Epithelial / LPF MANY (A) RARE   WBC, UA 21-50 <3 WBC/hpf   RBC / HPF 3-6 <3 RBC/hpf   Bacteria, UA MANY (A) RARE  I-Stat Arterial Blood Gas, ED - (order at [facility] and [facility] only)     Status: Abnormal   Collection Time: 09/07/14  5:10 AM  Result Value Ref Range   pH, Arterial 7.259 (L) 7.350 - 7.450   pCO2 arterial 97.9 (HH) 35.0 - 45.0 mmHg   pO2, Arterial 115.0 (H) 80.0 - 100.0 mmHg   Bicarbonate 43.8 (H) 20.0 - 24.0 mEq/L   TCO2 47 0 - 100 mmol/L   O2 Saturation 97.0 %   Acid-Base Excess 11.0 (H) 0.0 - 2.0 mmol/L   Patient temperature 98.6 F    Collection site RADIAL, ALLEN'S TEST ACCEPTABLE    Drawn by RT    Sample type ARTERIAL    Comment NOTIFIED PHYSICIAN   MRSA PCR Screening     Status: None   Collection Time: 09/07/14  5:55 AM  Result Value Ref Range   MRSA by PCR NEGATIVE NEGATIVE    Comment:        The GeneXpert MRSA Assay (FDA approved for NASAL specimens only), is one component of a comprehensive MRSA colonization surveillance program. It is not intended to diagnose MRSA infection nor to guide  or monitor treatment for MRSA infections.     Ct Head Wo Contrast  09/07/2014   CLINICAL DATA:  Altered mental status  EXAM: CT HEAD WITHOUT CONTRAST  TECHNIQUE: Contiguous axial images were obtained from the base of the skull through the vertex without intravenous contrast.  COMPARISON:  11/21/2013  FINDINGS: Skull and Sinuses:There is central forehead contusion without underlying fracture.  Orbits: No acute abnormality.  Brain: New high-density present within the atria of the lateral ventricles and within the right lateral ventricle body. The hemorrhage is small volume, with the largest hematoma measuring 17 mm (in the right lateral ventricular body). There is no hydrocephalus or subarachnoid extension. No brain swelling or evidence of infarct.  Generalized cerebral volume loss which typical for age. Diffuse, patchy cerebral white matter low density  which is similar to previous and likely from chronic small vessel disease.  Critical Value/emergent results were called by telephone at the time of interpretation on 09/07/2014 at 2:54 am to Dr. Linton Flemings , who verbally acknowledged these results.  IMPRESSION: 1. Acute hemorrhage in the lateral ventricles without hydrocephalus. 2. Forehead contusion without fracture. 3. Chronic white matter disease and mild brain atrophy.   Electronically Signed   By: Jorje Guild M.D.   On: 09/07/2014 02:54   Dg Chest Port 1 View  09/07/2014   CLINICAL DATA:  Transient altered awareness.  EXAM: PORTABLE CHEST - 1 VIEW  COMPARISON:  November 23, 2013.  FINDINGS: Stable cardiomegaly. No pneumothorax or pleural effusion is noted. Both lungs are clear. The visualized skeletal structures are unremarkable.  IMPRESSION: No acute cardiopulmonary abnormality seen.   Electronically Signed   By: Sabino Dick M.D.   On: 09/07/2014 02:12    Review of systems not obtained due to patient factors. Blood pressure 115/49, pulse 51, temperature 96.7 F (35.9 C), temperature source  Axillary, resp. rate 19, height _0  (1.651 m), weight 41.6 kg (91 lb 11.4 oz), SpO2 99 %. The patient is awake and aware. She is currently on Cipro And somewhat difficult to communicate with. She appears oriented. She follows commands readily. Motor and sensory function of her extremities are normal. No obvious cranial neuropathies. Examination head ears eyes nose and throat are unremarkable. Neck is supple with midline airway and normal carotid pulses. No evidence of bony abnormality. Extremities free from injury.  Assessment/Plan: Small amount of likely posttraumatic intraventricular hemorrhage. Overall this hemorrhage is of no structural consequence or concern. I would not even recommend follow-up head CT scanning and less patient's clinical situation changed.  Arden Axon A 09/07/2014, 7:38 AM

## 2014-09-07 NOTE — Progress Notes (Signed)
Chaplain introduced herself to pt's two sons. Son's indicated that their mother prefers to be called "Alvino ChapelJo." Sons were thankful that their mother looked better than they thought she would. Chaplain made family aware of her services should they need them.   09/07/14 1000  Clinical Encounter Type  Visited With Family  Visit Type Initial;Spiritual support  Stress Factors  Family Stress Factors None identified  Christin Mccreedy, Mayer MaskerCourtney F, Chaplain 09/07/2014 10:58 AM

## 2014-09-07 NOTE — H&P (Signed)
PULMONARY / CRITICAL CARE MEDICINE   Name: Laurie Horne MRN: [id number] DOB: 08/30/1934    ADMISSION DATE:  09/06/2014 CONSULTATION DATE:  09/06/2014  REFERRING MD :  EDP  CHIEF COMPLAINT:  AMS  INITIAL PRESENTATION: 80 year old female who [facility]/15 with altered mental status. Her husband states that she took double doses of her medications which include beta blockers and then he found her unresponsive on couch. In ED she was found to also be hypercarbic and was started on BiPAP.  STUDIES:  CT head 12/15: Acute hemorrhage in the lateral ventricles without hydrocephalus. Forehead contusion without fracture.  SIGNIFICANT EVENTS:   HISTORY OF PRESENT ILLNESS:  80 year old female with history of HTN, everyday smoker, and presumed COPD. [facility] after falls with acute respiratory failure. She was intubated in ICU and eventually discharged to SNF. Family reports that she has been anorexic for about the past 5 years and barely eats. She chews her food and spits it out.  Watching her weight has been very important to her. Weight in ED is 87lbs. After being discharged from SNF her USOH was significantly diminished when compared to that of before her hospitalization. Husband states that she has not been herself for some time. Over the past several months her abilities have been reduced. She can barely walk anymore according to her husband and has had several falls over the last 7 days. Her husband states that she has struck her head hard several times while falling. He is also concerned that she has been overdosing on her prescribed medications, he is not sure if this is intentional or not. For that reason he has been assisting her with administration. 12/15 he thinks she took her meds again after he gave them to her because he found her unresponsive on the cough in the PM. [facility] and was noted to be in hypercarbic respiratory failure so she was  placed on BiPAP. CT of the head was performed, which showed acute intracranial hemorrhage. Her mental status remains diminished. She was also bradycardic in ED. With concern that she overdosed on her home beta-blocker. She was given glucagon in ED with minimal response.   PAST MEDICAL HISTORY :   has a past medical history of Hyperlipidemia; Hypertension; and History of tobacco abuse.  has past surgical history that includes Tonsillectomy and adenoidectomy; Appendectomy; and childbirth. Prior to Admission medications   Medication Sig Start Date End Date Taking? Authorizing Provider  albuterol (PROVENTIL) (2.5 MG/3ML) 0.083% nebulizer solution Take 3 mLs (2.5 mg total) by nebulization every 3 (three) hours as needed for wheezing or shortness of breath. 11/27/13  Yes Jeanella CrazeBrandi L Ollis, NP  ALPRAZolam (XANAX) 0.25 MG tablet Take 0.25 mg by mouth daily.   Yes Historical Provider, MD  aspirin 81 MG tablet Take 81 mg by mouth daily.   Yes Historical Provider, MD  bisoprolol-hydrochlorothiazide [facility](ZIAC) 2.5-6.25 MG per tablet 1 tablet po daily 01/26/11  Yes Cassell Clementhomas Brackbill, MD  brinzolamide (AZOPT) 1 % ophthalmic suspension Place 1 drop into both eyes every morning. As directed   Yes Historical Provider, MD  feeding supplement, ENSURE COMPLETE, (ENSURE COMPLETE) LIQD Take 237 mLs by mouth 2 (two) times daily between meals. 11/27/13  Yes Jeanella CrazeBrandi L Ollis, NP  meclizine (ANTIVERT) 12.5 MG tablet Take 12.5 mg by mouth 3 (three) times daily as needed for dizziness.   Yes Historical Provider, MD  ramipril (ALTACE) 10  MG capsule Take 10 mg by mouth daily.   Yes Historical Provider, MD  Travoprost, BAK Free, (TRAVATAN) 0.004 % SOLN ophthalmic solution Place 1 drop into both eyes at bedtime. As directed   Yes Historical Provider, MD  ipratropium-albuterol (DUONEB) 0.5-2.5 (3) MG/3ML SOLN Take 3 mLs by nebulization every 6 (six) hours. Patient not taking: Reported on 09/07/2014 11/27/13   Jeanella CrazeBrandi L Ollis, NP  Multiple Vitamin  (MULTIVITAMIN WITH MINERALS) TABS tablet Take 1 tablet by mouth daily. Patient not taking: Reported on 09/07/2014 11/27/13   Jeanella CrazeBrandi L Ollis, NP  predniSONE (DELTASONE) 10 MG tablet 2 tabs daily for 4 days, then 1 tab daily for 4 days, then stop Patient not taking: Reported on 09/07/2014 11/27/13   Jeanella CrazeBrandi L Ollis, NP   Allergies  Allergen Reactions  . Codeine Other (See Comments)    unknown    FAMILY HISTORY:  indicated that her mother is deceased. She indicated that her father is deceased.  SOCIAL HISTORY:  reports that she has been smoking Cigarettes.  She has been smoking about 0.25 packs per day. She does not have any smokeless tobacco history on file. She reports that she does not drink alcohol.  REVIEW OF SYSTEMS:  Unable due to lethargy  SUBJECTIVE:   VITAL SIGNS: Temp:  [97.8 F (36.6 C)] 97.8 F (36.6 C) (12/15 0018) Pulse Rate:  [28-62] 49 (12/15 0300) Resp:  [11-24] 24 (12/15 0300) BP: (70-155)/(42-96) 128/82 mmHg (12/15 0300) SpO2:  [83 %-100 %] 95 % (12/15 0300) FiO2 (%):  [40 %] 40 % (12/15 0138) HEMODYNAMICS:   VENTILATOR SETTINGS: Vent Mode:  [-] BIPAP FiO2 (%):  [40 %] 40 % INTAKE / OUTPUT: No intake or output data in the 24 hours ending 09/07/14 [id number]  PHYSICAL EXAMINATION: General:  Malnourished elderly female. Chronically ill appearing. Neuro:  Lethargic, responsive to painful stimuli. HEENT:  [place], longitudinal laceration to forehead, remote. PERRL, no JVD noted.  Cardiovascular:  Bradycardic (40s), regular. No MRG Lungs:  Even unlabored respirations on BiPAP. Clear bilateral breath sounds.  Abdomen:  Soft, non-tender, non-distended Musculoskeletal:  Poor muscle tone, no acute deformity Skin:  Several ecchymotic areas. Laceration to forehead  LABS:  CBC  Recent Labs Lab 09/07/14 0015 09/07/14 0032  WBC 11.0*  --   HGB 15.2* 18.0*  HCT 48.6* 53.0*  PLT 185  --    Coag's No results for input(s): APTT, INR in the last 168 hours. BMET  Recent  Labs Lab 09/07/14 0015 09/07/14 0032  NA 134* 129*  K 3.9 3.7  CL 85* 78*  CO2 44*  --   BUN 8 8  CREATININE 0.47* 0.70  GLUCOSE 91 88   Electrolytes  Recent Labs Lab 09/07/14 0015  CALCIUM 9.6   Sepsis Markers  Recent Labs Lab 09/07/14 0020  LATICACIDVEN 0.71   ABG  Recent Labs Lab 09/07/14 0100  PHART 7.260*  PCO2ART 108.0*  PO2ART 140.0*   Liver Enzymes  Recent Labs Lab 09/07/14 0015  AST 18  ALT 9  ALKPHOS 99  BILITOT 0.3  ALBUMIN 3.3*   Cardiac Enzymes No results for input(s): TROPONINI, PROBNP in the last 168 hours. Glucose  Recent Labs Lab 09/07/14 0019  GLUCAP 86    Imaging No results found.   ASSESSMENT / PLAN:  NEUROLOGIC A:   Traumatic ICH in setting of frequent falls.  Acute metabolic encephalopathy vs 2nd to ICH  P:   RASS goal: 0 Hold sedating medications Neurosurgery contacted, will see in AM Likely  not a surgical candidate  PULMONARY A: Acute on chronic hypercarbic respiratory failure 2/2 presumed overdose  H/o COPD without evidence of acute exacerbation  P:   Continuous BiPAP on pressure control mode for rate CXR in AM PRN BDs DNI status  CARDIOVASCULAR A:  Sinus bradycardia, presumed beta blocker overdose (improving, got 2 gm of glucagon in ED) Hypotension  P:  Tele Glucagon and atropine at bedside Trend troponin IVF hydration Hold home anti-hypertensives (ramipril, bisoprolol-HCTZ) DNR status  RENAL A:   Hyponatremia Hypochloremia  P:   Gentle hydration Follow Bmet  GASTROINTESTINAL A:   Protein calorie malnutrition  P:   SUP: IV Protonix NPO on BiPAP Consult dietary once able to take PO  HEMATOLOGIC A:   No acute issues  P:  Follow CBC No VTE chemoprophylaxis SCDs  INFECTIOUS A:   No acute issues  P:   BCx2 12/15 > UC 12/15 > Monitor off ABX  ENDOCRINE A:   No acute issue  P:   Monitor glucose on Bmet   FAMILY  - Updates: Husband and daughter in law at  bedside. They state that patient has been saying for some time now that she is ready to die, and does not want to live the way she has been living, in such a severely limited state. They were considering that [place] would probably want to be DNR/DNI given these statements she had made. She became more alert and alert during this conversation at which time she told myself and her husband Jonny Ruiz that she would not want to be placed on the ventilator, and would not want CPR/defibrillation in the setting of a cardiac arrest.   - Inter-disciplinary family meet or Palliative Care meeting due by:  12/22   Joneen Roach, ACNP [place] [facility] Pager [phone] or [phone]   PCCM ATTENDING: Pt seen on work rounds with care provider noted above. We reviewed pt's initial presentation, consultants notes and hospital database in detail.  The above assessment and plan was formulated under my direction.  She has gradually improved over course of day and is able to tolerate [place] O2. I have changed BiPAP to PRN. Will consider Psych consult this hospitalization. Consider Palliative Care  Billy Fischer, MD;  PCCM service; Mobile [phone]

## 2014-09-07 NOTE — Progress Notes (Signed)
UR completed.  Tylon Kemmerling, RN BSN MHA CCM Trauma/Neuro ICU Case Manager [phone]  

## 2014-09-07 NOTE — Progress Notes (Signed)
Patient's heart rate fluctuating between 36-46. Patient is arousable to voice and follows commands but is intermittently confused. Left Pupil nonreactive. Previous neuro assessments were charted as reactive. Patient condition is unchanged otherwise. MD made aware. Will continue to assess.

## 2014-09-07 NOTE — Progress Notes (Addendum)
Pt heart rate 35-42, Sinus brady. E-link notified. Orders given. Will continue to monitor

## 2014-09-07 NOTE — ED Notes (Addendum)
CBG = 86  RN informed of result.

## 2014-09-07 NOTE — Progress Notes (Signed)
Pt off BIPAP on nasal cannula. Pt tolerating well at this time. RT will continue monitor.

## 2014-09-07 NOTE — ED Notes (Signed)
Pt st's her husband has made a mistake because there is nothing wrong with her.

## 2014-09-08 ENCOUNTER — Inpatient Hospital Stay (HOSPITAL_COMMUNITY): Payer: Medicare Other

## 2014-09-08 ENCOUNTER — Encounter (HOSPITAL_COMMUNITY): Payer: Self-pay | Admitting: Psychiatry

## 2014-09-08 DIAGNOSIS — S06309A Unspecified focal traumatic brain injury with loss of consciousness of unspecified duration, initial encounter: Secondary | ICD-10-CM | POA: Diagnosis not present

## 2014-09-08 DIAGNOSIS — F32A Depression, unspecified: Secondary | ICD-10-CM | POA: Diagnosis present

## 2014-09-08 DIAGNOSIS — R45851 Suicidal ideations: Secondary | ICD-10-CM

## 2014-09-08 DIAGNOSIS — F419 Anxiety disorder, unspecified: Secondary | ICD-10-CM | POA: Diagnosis present

## 2014-09-08 DIAGNOSIS — E871 Hypo-osmolality and hyponatremia: Secondary | ICD-10-CM

## 2014-09-08 DIAGNOSIS — R001 Bradycardia, unspecified: Secondary | ICD-10-CM

## 2014-09-08 DIAGNOSIS — F329 Major depressive disorder, single episode, unspecified: Secondary | ICD-10-CM

## 2014-09-08 DIAGNOSIS — S06369D Traumatic hemorrhage of cerebrum, unspecified, with loss of consciousness of unspecified duration, subsequent encounter: Secondary | ICD-10-CM

## 2014-09-08 LAB — TSH: TSH: 3.06 u[IU]/mL (ref 0.350–4.500)

## 2014-09-08 MED ORDER — ASPIRIN 81 MG PO CHEW
81.0000 mg | CHEWABLE_TABLET | Freq: Every day | ORAL | Status: DC
  Administered 2014-09-08 – 2014-09-23 (×14): 81 mg via ORAL
  Filled 2014-09-08 (×15): qty 1

## 2014-09-08 MED ORDER — ADULT MULTIVITAMIN W/MINERALS CH
1.0000 | ORAL_TABLET | Freq: Every day | ORAL | Status: DC
  Administered 2014-09-08 – 2014-09-23 (×14): 1 via ORAL
  Filled 2014-09-08 (×14): qty 1

## 2014-09-08 MED ORDER — AMLODIPINE BESYLATE 5 MG PO TABS
5.0000 mg | ORAL_TABLET | Freq: Every day | ORAL | Status: DC
  Administered 2014-09-08 – 2014-09-23 (×14): 5 mg via ORAL
  Filled 2014-09-08 (×14): qty 1

## 2014-09-08 MED ORDER — SODIUM CHLORIDE 0.9 % IJ SOLN: 3.0000 mL | INTRAMUSCULAR | Status: DC | PRN

## 2014-09-08 MED ORDER — ASPIRIN 81 MG PO TABS: 81.0000 mg | ORAL_TABLET | Freq: Every day | ORAL | Status: DC

## 2014-09-08 MED ORDER — BRINZOLAMIDE 1 % OP SUSP
1.0000 [drp] | Freq: Every day | OPHTHALMIC | Status: DC
  Administered 2014-09-08 – 2014-09-23 (×15): 1 [drp] via OPHTHALMIC
  Filled 2014-09-08: qty 10

## 2014-09-08 MED ORDER — IPRATROPIUM-ALBUTEROL 0.5-2.5 (3) MG/3ML IN SOLN
3.0000 mL | Freq: Four times a day (QID) | RESPIRATORY_TRACT | Status: DC
  Administered 2014-09-08 – 2014-09-10 (×7): 3 mL via RESPIRATORY_TRACT
  Filled 2014-09-08 (×8): qty 3

## 2014-09-08 MED ORDER — LATANOPROST 0.005 % OP SOLN
1.0000 [drp] | Freq: Every day | OPHTHALMIC | Status: DC
  Administered 2014-09-08 – 2014-09-22 (×15): 1 [drp] via OPHTHALMIC
  Filled 2014-09-08 (×2): qty 2.5

## 2014-09-08 MED ORDER — SODIUM CHLORIDE 0.9 % IJ SOLN
3.0000 mL | Freq: Two times a day (BID) | INTRAMUSCULAR | Status: DC
  Administered 2014-09-08 – 2014-09-09 (×3): 3 mL via INTRAVENOUS
  Administered 2014-09-09: 10 mL via INTRAVENOUS
  Administered 2014-09-10 – 2014-09-23 (×22): 3 mL via INTRAVENOUS

## 2014-09-08 MED ORDER — ENSURE COMPLETE PO LIQD
237.0000 mL | Freq: Two times a day (BID) | ORAL | Status: DC
  Administered 2014-09-09: 237 mL via ORAL

## 2014-09-08 MED ORDER — HYDRALAZINE HCL 20 MG/ML IJ SOLN
10.0000 mg | INTRAMUSCULAR | Status: DC | PRN
  Administered 2014-09-09: 10 mg via INTRAVENOUS
  Filled 2014-09-08 (×2): qty 1

## 2014-09-08 NOTE — Clinical Documentation Improvement (Signed)
Please clarify below. Thank you.  Indicators: Acute on chronic respiratory failure w/hypercapnia O2 dependent RR 26 History of Tobacco abuse Possible overdose of beta-blockers  Abnormal Lab and/or Testing Results: Component     Latest Ref Rng 09/07/2014         3:27 AM  FIO2      0.40  Delivery systems      VENTILATOR  Mode      BILEVEL POSITIVE AIRWAY PRESSURE  Inspiratory PAP      20  Expiratory PAP      8  pH, Arterial     7.350 - 7.450 7.263 (L)  pCO2 arterial     35.0 - 45.0 mmHg 93.7 (HH)  pO2, Arterial     80.0 - 100.0 mmHg 106.0 (H)  Bicarbonate     20.0 - 24.0 mEq/L 40.9 (H)  TCO2     0 - 100 mmol/L 43.8  Acid-Base Excess     0.0 - 2.0 mmol/L 13.6 (H)  O2 Saturation      97.5  Patient temperature      98.6  Collection site      RIGHT RADIAL  Drawn by      [phone]  Sample type      ARTERIAL DRAW  Allens test (pass/fail)     PASS PASS   Component     Latest Ref Rng 09/07/2014         5:10 AM  FIO2        Delivery systems        Mode        Inspiratory PAP        Expiratory PAP        pH, Arterial     7.350 - 7.450 7.259 (L)  pCO2 arterial     35.0 - 45.0 mmHg 97.9 (HH)  pO2, Arterial     80.0 - 100.0 mmHg 115.0 (H)  Bicarbonate     20.0 - 24.0 mEq/L 43.8 (H)  TCO2     0 - 100 mmol/L 47  Acid-Base Excess     0.0 - 2.0 mmol/L 11.0 (H)  O2 Saturation      97.0  Patient temperature      98.6 F  Collection site      RADIAL, ALLEN'S TEST ACCEPTABLE  Drawn by      RT  Sample type      ARTERIAL  Allens test (pass/fail)     PASS    Treatment provided: BiPap Continuous Pulse Ox ABG monitoring  Possible Clinical Conditions:  Respiratory acidosis Other Unable to determine  Thank you, Khiya Friese T. Luiz OchoaWilliams RN, MSN, MBA/MHA Clinical Documentation Specialist [email or web address] Office # [phone]

## 2014-09-08 NOTE — Progress Notes (Signed)
INITIAL NUTRITION ASSESSMENT  DOCUMENTATION CODES Per approved criteria  -Severe malnutrition in the context of chronic illness -Underweight   INTERVENTION: Ensure Complete po BID, each supplement provides 350 kcal and 13 grams of protein  NUTRITION DIAGNOSIS: Malnurition related to chronic illness as evidenced by severe fat and muscle depletion.   Goal: Pt to meet >/= 90% of their estimated nutrition needs   Monitor:  PO intake, supplement acceptance, weight trends, labs  Reason for Assessment: Pt identified as at nutrition risk on the Malnutrition Screen Tool   80 y.o. female  Admitting Dx: AMS  ASSESSMENT: Pt admitted from home where she lives with her husband after being found unresponsive. Husband concerned that pt took a double dose of her medications. Per family she has been depressed lately, psych consult pending.  No family at bedside. Pt unable to tell me what she was eating at home or how much.  Per RN pt is chewing her food but spits it out before swallowing. Pt is drinking liquids well.  Pt likes chocolate ensure but does not drink this consistently at home.   Nutrition Focused Physical Exam:  Subcutaneous Fat:  Orbital Region: severe depletion  Upper Arm Region: severe depletion  Thoracic and Lumbar Region: severe depletion  Muscle:  Temple Region: severe depletion Clavicle Bone Region: severe depletion  Clavicle and Acromion Bone Region: severe depletion Scapular Bone Region: severe depletion  Dorsal Hand: severe depletion  Patellar Region: severe depletion Anterior Thigh Region: severe depletion  Posterior Calf Region: severe depletion  Edema: not present  Height: Ht Readings from Last 1 Encounters:  09/07/14 5\' 5"  (1.651 m)    Weight: Wt Readings from Last 1 Encounters:  09/08/14 93 lb 11.1 oz (42.5 kg)    Ideal Body Weight: 56.8 kg   % Ideal Body Weight: 75%  Wt Readings from Last 10 Encounters:  09/08/14 93 lb 11.1 oz (42.5 kg)   11/27/13 87 lb 8.4 oz (39.7 kg)    Usual Body Weight: per pt 109 lb  % Usual Body Weight: 85%  BMI:  Body mass index is 15.59 kg/(m^2).  Estimated Nutritional Needs: Kcal: 1300-1500 Protein: 60-75 grams Fluid: > 1.5 L/day  Skin: multiple scabbed areas to all extremities   Diet Order: Diet regular  EDUCATION NEEDS: -No education needs identified at this time   Intake/Output Summary (Last 24 hours) at 09/08/14 1043 Last data filed at 09/08/14 0900  Gross per 24 hour  Intake   1220 ml  Output    875 ml  Net    345 ml    Last BM: PTA   Labs:   Recent Labs Lab 09/07/14 0015 09/07/14 0032 09/07/14 0753  NA 134* 129* 131*  K 3.9 3.7 4.3  CL 85* 78* 88*  CO2 44*  --  36*  BUN 8 8 8   CREATININE 0.47* 0.70 0.34*  CALCIUM 9.6  --  8.9  GLUCOSE 91 88 113*    CBG (last 3)   Recent Labs  09/07/14 0019  GLUCAP 86    Scheduled Meds: . antiseptic oral rinse  7 mL Mouth Rinse q12n4p  . bisoprolol-hydrochlorothiazide  1 tablet Oral Daily  . chlorhexidine  15 mL Mouth Rinse BID    Continuous Infusions: . dextrose 5 % and 0.9% NaCl 50 mL/hr at 09/07/14 1500    Past Medical History  Diagnosis Date  . Hyperlipidemia   . Hypertension   . History of tobacco abuse     Past Surgical History  Procedure  Laterality Date  . Tonsillectomy and adenoidectomy    . Appendectomy    . Childbirth      x 3    Laurie Horne RD, LDN, CNSC [phone] Pager [phone] After Hours Pager

## 2014-09-08 NOTE — Consult Note (Signed)
[facility] Face-to-Face Psychiatry Consult   Reason for Consult:  Rule out depression, questionable overdose on HTN medications Referring Physician:  Primary MD  Laurie Horne is an 80 y.o. female. Total Time spent with patient: 1 hour  Assessment: AXIS I:   Anxiety; depression NOS AXIS II:  Deferred AXIS III:   Past Medical History  Diagnosis Date  . Hyperlipidemia   . Hypertension   . History of tobacco abuse    AXIS IV:  chronic mental and physical conditions AXIS V:  11-20 some danger of hurting self or others possible OR occasionally fails to maintain minimal personal hygiene OR gross impairment in communication  Plan:  Recommend psychiatric Inpatient admission when medically cleared. When stable and medically cleared, admit to geropsychiatry at [facility] in [place].  Dr. Parke Poisson reviewed the patient and concurs with the plan  Subjective:   Laurie Horne is a 80 y.o. female patient admitted with depression once medical clearance is given.  HPI:  The patient was able to answer a few questions in the beginning of the interview but fell asleep quickly.  She did give verbal consent for her husband and son to discuss her situation and remain in the room when she was talking.  Evidently, the patient was functioning well until about 2 to 3 years ago when she began to have episodes of confusion and odd behaviors at times.  She began to eat very little and her weight decreased to 74 pounds last February when she also had a fall but refused to get medical assistance until she "collapsed".  Laurie Horne was in ICU at this time, recovered and went to step-down and then rehab at a nursing home where she made statements at times to family members that "I just want to go up (heaven)".  Her weight increased slowly to about 90 lbs but she continues to chew food and spit it out.  This behavior has been occurring for the past 3-4 years.  She states she is "not hungry" and acknowledges her appetite as "not good".   Swallowing tests have ruled out dysphagia.  The patient's family reports increase in sleep, low levels of energy, frequent falls, high pain tolerance, and frustration when she cannot do something.  Recently, she has refused to go upstairs in her house to bath.  Frequently "picks" at her skin.  She has 3 supportive sons and a husband.  Her son reports that she overtook her blood pressure medications prior to admission and questions if it was intentional since she has made vague suicide threats ("You don't know what I might do.").  Multiple medical issues (metabolic acidosis, UTI, etc.) make it difficult to make a definitive diagnosis at this time.  Will continue to follow the patient as needed. HPI Elements:   Location:  generalized. Quality:  acute. Severity:  severe. Timing:  constant. Duration:  months. Context:  decrease in health .  Past Psychiatric History: Past Medical History  Diagnosis Date  . Hyperlipidemia   . Hypertension   . History of tobacco abuse     reports that she has been smoking Cigarettes.  She has been smoking about 0.25 packs per day. She does not have any smokeless tobacco history on file. She reports that she does not drink alcohol. Her drug history is not on file. History reviewed. No pertinent family history.         Allergies:   Allergies  Allergen Reactions  . Codeine Other (See Comments)    unknown  ACT Assessment Complete:  No:   Past Psychiatric History: Diagnosis:  Depression NOS, anxiety  Hospitalizations:  No psychiatric hospitalization  Outpatient Care:  None for mental health  Substance Abuse Care:  N/A  Self-Mutilation:  None  Suicidal Attempts:  Questionable overdose  Homicidal Behaviors:  None   Violent Behaviors:  None   Place of Residence:  [place] Marital Status:  Married  Employed/Unemployed:  Retired Education:  High school Family Supports:  Husband and 3 sons Objective: Blood pressure 137/77, pulse 59, temperature 98 F  (36.7 C), temperature source Oral, resp. rate 25, height $RemoveBe'5\' 5"'EfgfvVFgZ$  (1.651 m), weight 93 lb 11.1 oz (42.5 kg), SpO2 95 %.Body mass index is 15.59 kg/(m^2). Results for orders placed or performed during the hospital encounter of 09/06/14 (from the past 72 hour(s))  CBC with Differential     Status: Abnormal   Collection Time: 09/07/14 12:15 AM  Result Value Ref Range   WBC 11.0 (H) 4.0 - 10.5 K/uL   RBC 5.13 (H) 3.87 - 5.11 MIL/uL   Hemoglobin 15.2 (H) 12.0 - 15.0 g/dL   HCT 48.6 (H) 36.0 - 46.0 %   MCV 94.7 78.0 - 100.0 fL   MCH 29.6 26.0 - 34.0 pg   MCHC 31.3 30.0 - 36.0 g/dL   RDW 12.7 11.5 - 15.5 %   Platelets 185 150 - 400 K/uL   Neutrophils Relative % 76 43 - 77 %   Neutro Abs 8.3 (H) 1.7 - 7.7 K/uL   Lymphocytes Relative 14 12 - 46 %   Lymphs Abs 1.6 0.7 - 4.0 K/uL   Monocytes Relative 10 3 - 12 %   Monocytes Absolute 1.1 (H) 0.1 - 1.0 K/uL   Eosinophils Relative 0 0 - 5 %   Eosinophils Absolute 0.0 0.0 - 0.7 K/uL   Basophils Relative 0 0 - 1 %   Basophils Absolute 0.0 0.0 - 0.1 K/uL  Comprehensive metabolic panel     Status: Abnormal   Collection Time: 09/07/14 12:15 AM  Result Value Ref Range   Sodium 134 (L) 137 - 147 mEq/L   Potassium 3.9 3.7 - 5.3 mEq/L   Chloride 85 (L) 96 - 112 mEq/L   CO2 44 (HH) 19 - 32 mEq/L    Comment: CRITICAL RESULT CALLED TO, READ BACK BY AND VERIFIED WITH: FIELDS K,RN 09/07/14 0105 WAYK    Glucose, Bld 91 70 - 99 mg/dL   BUN 8 6 - 23 mg/dL   Creatinine, Ser 0.47 (L) 0.50 - 1.10 mg/dL   Calcium 9.6 8.4 - 10.5 mg/dL   Total Protein 6.8 6.0 - 8.3 g/dL   Albumin 3.3 (L) 3.5 - 5.2 g/dL   AST 18 0 - 37 U/L   ALT 9 0 - 35 U/L   Alkaline Phosphatase 99 39 - 117 U/L   Total Bilirubin 0.3 0.3 - 1.2 mg/dL   GFR calc non Af Amer >90 >90 mL/min   GFR calc Af Amer >90 >90 mL/min    Comment: (NOTE) The eGFR has been calculated using the CKD EPI equation. This calculation has not been validated in all clinical situations. eGFR's persistently <90  mL/min signify possible Chronic Kidney Disease.    Anion gap 5 5 - 15  I-stat troponin, ED     Status: None   Collection Time: 09/07/14 12:18 AM  Result Value Ref Range   Troponin i, poc 0.00 0.00 - 0.08 ng/mL   Comment 3  Comment: Due to the release kinetics of cTnI, a negative result within the first hours of the onset of symptoms does not rule out myocardial infarction with certainty. If myocardial infarction is still suspected, repeat the test at appropriate intervals.   CBG monitoring, ED     Status: None   Collection Time: 09/07/14 12:19 AM  Result Value Ref Range   Glucose-Capillary 86 70 - 99 mg/dL  I-Stat CG4 Lactic Acid, ED     Status: None   Collection Time: 09/07/14 12:20 AM  Result Value Ref Range   Lactic Acid, Venous 0.71 0.5 - 2.2 mmol/L  I-Stat Chem 8, ED     Status: Abnormal   Collection Time: 09/07/14 12:32 AM  Result Value Ref Range   Sodium 129 (L) 137 - 147 mEq/L   Potassium 3.7 3.7 - 5.3 mEq/L   Chloride 78 (L) 96 - 112 mEq/L   BUN 8 6 - 23 mg/dL   Creatinine, Ser 0.70 0.50 - 1.10 mg/dL   Glucose, Bld 88 70 - 99 mg/dL   Calcium, Ion 1.16 1.13 - 1.30 mmol/L   TCO2 43 0 - 100 mmol/L   Hemoglobin 18.0 (H) 12.0 - 15.0 g/dL   HCT 53.0 (H) 36.0 - 46.0 %   Comment NOTIFIED PHYSICIAN   Blood gas, arterial     Status: Abnormal   Collection Time: 09/07/14  1:00 AM  Result Value Ref Range   O2 Content 2.5 L/min   Delivery systems NASAL CANNULA    pH, Arterial 7.260 (L) 7.350 - 7.450   pCO2 arterial 108.0 (HH) 35.0 - 45.0 mmHg    Comment: CRITICAL RESULT CALLED TO, READ BACK BY AND VERIFIED WITH:  NICOLE MILLER, RT AT 0121 BY T COCKMAN, RT ON 12 CRITICAL RESULT CALLED TO, READ BACK BY AND VERIFIED WITH: NICOLE MILLER, RT AT 0121 BY T COCKMAN RT ON $Rem'12 15 15    'vsmM$ pO2, Arterial 140.0 (H) 80.0 - 100.0 mmHg   Bicarbonate 46.7 (H) 20.0 - 24.0 mEq/L   TCO2 50.0 0 - 100 mmol/L   Acid-Base Excess 19.0 (H) 0.0 - 2.0 mmol/L   O2 Saturation 98.5 %    Patient temperature 98.6    Collection site RIGHT RADIAL    Drawn by [phone]    Sample type ARTERIAL DRAW    Allens test (pass/fail) PASS PASS  Blood gas, arterial     Status: Abnormal   Collection Time: 09/07/14  3:27 AM  Result Value Ref Range   FIO2 0.40 %   Delivery systems VENTILATOR    Mode BILEVEL POSITIVE AIRWAY PRESSURE    Inspiratory PAP 20    Expiratory PAP 8    pH, Arterial 7.263 (L) 7.350 - 7.450   pCO2 arterial 93.7 (HH) 35.0 - 45.0 mmHg    Comment: CRITICAL RESULT CALLED TO, READ BACK BY AND VERIFIED WITH: NICOLE MILLER, RT AT 335 BY T COCKMAN RT ON12 15 15    pO2, Arterial 106.0 (H) 80.0 - 100.0 mmHg   Bicarbonate 40.9 (H) 20.0 - 24.0 mEq/L   TCO2 43.8 0 - 100 mmol/L   Acid-Base Excess 13.6 (H) 0.0 - 2.0 mmol/L   O2 Saturation 97.5 %   Patient temperature 98.6    Collection site RIGHT RADIAL    Drawn by [phone]    Sample type ARTERIAL DRAW    Allens test (pass/fail) PASS PASS  Urinalysis, Routine w reflex microscopic     Status: Abnormal   Collection Time: 09/07/14  4:00 AM  Result Value Ref Range   Color, Urine YELLOW YELLOW   APPearance CLOUDY (A) CLEAR   Specific Gravity, Urine 1.013 1.005 - 1.030   pH 6.5 5.0 - 8.0   Glucose, UA 100 (A) NEGATIVE mg/dL   Hgb urine dipstick SMALL (A) NEGATIVE   Bilirubin Urine NEGATIVE NEGATIVE   Ketones, ur NEGATIVE NEGATIVE mg/dL   Protein, ur NEGATIVE NEGATIVE mg/dL   Urobilinogen, UA 0.2 0.0 - 1.0 mg/dL   Nitrite NEGATIVE NEGATIVE   Leukocytes, UA MODERATE (A) NEGATIVE  Urine microscopic-add on     Status: Abnormal   Collection Time: 09/07/14  4:00 AM  Result Value Ref Range   Squamous Epithelial / LPF MANY (A) RARE   WBC, UA 21-50 <3 WBC/hpf   RBC / HPF 3-6 <3 RBC/hpf   Bacteria, UA MANY (A) RARE  I-Stat Arterial Blood Gas, ED - (order at [facility] and [facility] only)     Status: Abnormal   Collection Time: 09/07/14  5:10 AM  Result Value Ref Range   pH, Arterial 7.259 (L) 7.350 - 7.450   pCO2 arterial 97.9 (HH) 35.0 -  45.0 mmHg   pO2, Arterial 115.0 (H) 80.0 - 100.0 mmHg   Bicarbonate 43.8 (H) 20.0 - 24.0 mEq/L   TCO2 47 0 - 100 mmol/L   O2 Saturation 97.0 %   Acid-Base Excess 11.0 (H) 0.0 - 2.0 mmol/L   Patient temperature 98.6 F    Collection site RADIAL, ALLEN'S TEST ACCEPTABLE    Drawn by RT    Sample type ARTERIAL    Comment NOTIFIED PHYSICIAN   MRSA PCR Screening     Status: None   Collection Time: 09/07/14  5:55 AM  Result Value Ref Range   MRSA by PCR NEGATIVE NEGATIVE    Comment:        The GeneXpert MRSA Assay (FDA approved for NASAL specimens only), is one component of a comprehensive MRSA colonization surveillance program. It is not intended to diagnose MRSA infection nor to guide or monitor treatment for MRSA infections.   Culture, blood (routine x 2)     Status: None (Preliminary result)   Collection Time: 09/07/14  7:40 AM  Result Value Ref Range   Specimen Description BLOOD RIGHT WRIST    Special Requests BOTTLES DRAWN AEROBIC ONLY 3CC    Culture  Setup Time      09/07/2014 13:12 Performed at [place] NO GROWTH TO DATE CULTURE WILL BE HELD FOR 5 DAYS BEFORE ISSUING A FINAL NEGATIVE REPORT Performed at [facility]    Report Status PENDING   Basic metabolic panel     Status: Abnormal   Collection Time: 09/07/14  7:53 AM  Result Value Ref Range   Sodium 131 (L) 137 - 147 mEq/L   Potassium 4.3 3.7 - 5.3 mEq/L   Chloride 88 (L) 96 - 112 mEq/L   CO2 36 (H) 19 - 32 mEq/L   Glucose, Bld 113 (H) 70 - 99 mg/dL   BUN 8 6 - 23 mg/dL   Creatinine, Ser 0.34 (L) 0.50 - 1.10 mg/dL    Comment: DELTA CHECK NOTED   Calcium 8.9 8.4 - 10.5 mg/dL   GFR calc non Af Amer >90 >90 mL/min   GFR calc Af Amer >90 >90 mL/min    Comment: (NOTE) The eGFR has been calculated using the CKD EPI equation. This calculation has  not been validated in all clinical situations. eGFR's persistently <90 mL/min signify possible Chronic  Kidney Disease.    Anion gap 7 5 - 15  CBC     Status: Abnormal   Collection Time: 09/07/14  7:53 AM  Result Value Ref Range   WBC 8.4 4.0 - 10.5 K/uL   RBC 5.09 3.87 - 5.11 MIL/uL   Hemoglobin 14.9 12.0 - 15.0 g/dL    Comment: REPEATED TO VERIFY   HCT 48.2 (H) 36.0 - 46.0 %   MCV 94.7 78.0 - 100.0 fL   MCH 29.3 26.0 - 34.0 pg   MCHC 30.9 30.0 - 36.0 g/dL   RDW 12.7 11.5 - 15.5 %   Platelets 144 (L) 150 - 400 K/uL  Protime-INR     Status: None   Collection Time: 09/07/14  7:53 AM  Result Value Ref Range   Prothrombin Time 13.8 11.6 - 15.2 seconds   INR 1.05 0.00 - 1.49  Troponin I     Status: None   Collection Time: 09/07/14  7:53 AM  Result Value Ref Range   Troponin I <0.30 <0.30 ng/mL    Comment:        Due to the release kinetics of cTnI, a negative result within the first hours of the onset of symptoms does not rule out myocardial infarction with certainty. If myocardial infarction is still suspected, repeat the test at appropriate intervals.   Culture, blood (routine x 2)     Status: None (Preliminary result)   Collection Time: 09/07/14  7:54 AM  Result Value Ref Range   Specimen Description BLOOD RIGHT HAND    Special Requests BOTTLES DRAWN AEROBIC ONLY 2.5CC    Culture  Setup Time      09/07/2014 13:12 Performed at [facility]    Culture             BLOOD CULTURE RECEIVED NO GROWTH TO DATE CULTURE WILL BE HELD FOR 5 DAYS BEFORE ISSUING A FINAL NEGATIVE REPORT Note: Culture results may be compromised due to an inadequate volume of blood received in culture bottles. Performed at [place] are reviewed and are pertinent for medical issues being addressed.  Current Facility-Administered Medications  Medication Dose Route Frequency Provider Last Rate Last Dose  . 0.9 %  sodium chloride infusion  250 mL Intravenous PRN Corey Harold, NP   Stopped at 09/07/14 1404  . albuterol (PROVENTIL) (2.5 MG/3ML) 0.083%  nebulizer solution 2.5 mg  2.5 mg Nebulization Q2H PRN Corey Harold, NP      . ALPRAZolam Duanne Moron) tablet 0.25 mg  0.25 mg Oral TID PRN Chesley Mires, MD   0.25 mg at 09/08/14 1616  . amLODipine (NORVASC) tablet 5 mg  5 mg Oral Daily Wilhelmina Mcardle, MD   5 mg at 09/08/14 1300  . antiseptic oral rinse (CPC / CETYLPYRIDINIUM CHLORIDE 0.05%) solution 7 mL  7 mL Mouth Rinse q12n4p Corey Harold, NP   7 mL at 09/08/14 1300  . aspirin chewable tablet 81 mg  81 mg Oral Daily Juanito Doom, MD   81 mg at 09/08/14 1300  . brinzolamide (AZOPT) 1 % ophthalmic suspension 1 drop  1 drop Both Eyes Daily Wilhelmina Mcardle, MD   1 drop at 09/08/14 1300  . chlorhexidine (PERIDEX) 0.12 % solution 15 mL  15 mL Mouth Rinse BID Corey Harold, NP   15 mL at 09/08/14  1947  . dextrose 5 %-0.9 % sodium chloride infusion   Intravenous Continuous Wilhelmina Mcardle, MD 50 mL/hr at 09/08/14 1900    . feeding supplement (ENSURE COMPLETE) (ENSURE COMPLETE) liquid 237 mL  237 mL Oral BID BM Heather Cornelison Pitts, RD      . hydrALAZINE (APRESOLINE) injection 10-20 mg  10-20 mg Intravenous Q4H PRN Wilhelmina Mcardle, MD      . ipratropium-albuterol (DUONEB) 0.5-2.5 (3) MG/3ML nebulizer solution 3 mL  3 mL Nebulization QID Wilhelmina Mcardle, MD   3 mL at 09/08/14 1929  . latanoprost (XALATAN) 0.005 % ophthalmic solution 1 drop  1 drop Both Eyes QHS Wilhelmina Mcardle, MD      . multivitamin with minerals tablet 1 tablet  1 tablet Oral Daily Wilhelmina Mcardle, MD   1 tablet at 09/08/14 1300  . sodium chloride 0.9 % injection 3 mL  3 mL Intravenous Q12H Juanito Doom, MD   3 mL at 09/08/14 1533  . sodium chloride 0.9 % injection 3 mL  3 mL Intravenous PRN Juanito Doom, MD        Psychiatric Specialty Exam:     Blood pressure 137/77, pulse 59, temperature 98 F (36.7 C), temperature source Oral, resp. rate 25, height $RemoveBe'5\' 5"'JpTvEOQrt$  (1.651 m), weight 93 lb 11.1 oz (42.5 kg), SpO2 95 %.Body mass index is 15.59 kg/(m^2).  General  Appearance: Disheveled  Eye Contact::  Minimal  Speech:  Slow  Volume:  Decreased  Mood:  Depressed  Affect:  Blunt  Thought Process:  Coherent but confused at times  Orientation:  Full (Time, Place, and Person)  Thought Content:  WDL during her brief verbal interaction  Suicidal Thoughts:  Yes.  without intent/plan  Homicidal Thoughts:  No  Memory:  Immediate;   Poor Recent;   Poor Remote;   Poor  Judgement:  Impaired  Insight:  Fair  Psychomotor Activity:  Decreased  Concentration:  Poor  Recall:  Poor  Fund of Knowledge:Fair  Language: Fair  Akathisia:  No  Handed:  Right  AIMS (if indicated):     Assets:  Financial Resources/Insurance Housing Resilience Social Support  Sleep:      Musculoskeletal: Strength & Muscle Tone: decreased Gait & Station: did not see her stand  Patient leans: N/A  Treatment Plan Summary: Daily contact with patient to assess and evaluate symptoms and progress in treatment Medication management; stabilize medically and admit [facility]ychiatry [facility]  Waylan Boga, [place] 09/08/2014 9:46 PM   Case discussed with me as above

## 2014-09-08 NOTE — Progress Notes (Signed)
Much more alert. Very flat affect. Responds slowly. Poorly oriented. Denies new complaints. No distress  Filed Vitals:   09/08/14 1130 09/08/14 1131 09/08/14 1200 09/08/14 1300  BP:   151/70 174/57  Pulse:   52 54  Temp:      TempSrc:      Resp:   21 22  Height:      Weight:      SpO2: 100% 100% 100% 94%   NAD Small abrasion on forehead PERRL, EOMI No JVD Diminished BS without wheezes Bradycardic (sinus) without murmur abd scaphoid, NT, +BS Ext warm without edema Neuro without focal deficits  I have reviewed all of today's lab results. Relevant abnormalities are discussed in the A/P section  CXR: Probable vasc congestion and mild CM  IMPRESSION: Acute hypercarbic respiratory - clinically much improved Chronic respiratory failure due to COPD without acute bronchospasm Very small traumatic SDH after fall - per NS, no further eval indicated unles clinically changes Sinus bradycardia Hypertension Hyponatremia Severe protein- calorie malnutrition Profound decline in mental status and functional status over past several months with anorexia  R/O dementia vs pseudodementia due to depression  R/O hypothyroidism  PLAN: Cont scheduled and PRN bronchodilators DC bisoprolol - avoid beta blockers due to bradycardia DC HCTZ - avoid diuretics due to hyponatremia Begin amlodipine 12/16 Begin PRN hydralazine 12/16 to maintain SBP < 170 mmHg Encourage nutritional intake DNR per discussion with husband and son 12/15 Check TSH 12/17 AM Psychiatry consult requested 12/16 Transfer to med-surg 12/16  TRH to assume care as of AM 12/17 and PCCM to sign off. Discussed with Dr Nichola SizerMcClung  David Simonds, MD ; [facility] [place] Mobile [phone].  After 5:30 PM or weekends, call [phone]

## 2014-09-09 DIAGNOSIS — I10 Essential (primary) hypertension: Secondary | ICD-10-CM

## 2014-09-09 DIAGNOSIS — F1910 Other psychoactive substance abuse, uncomplicated: Secondary | ICD-10-CM

## 2014-09-09 DIAGNOSIS — E46 Unspecified protein-calorie malnutrition: Secondary | ICD-10-CM

## 2014-09-09 DIAGNOSIS — R404 Transient alteration of awareness: Secondary | ICD-10-CM

## 2014-09-09 DIAGNOSIS — I9589 Other hypotension: Secondary | ICD-10-CM

## 2014-09-09 DIAGNOSIS — F419 Anxiety disorder, unspecified: Secondary | ICD-10-CM

## 2014-09-09 DIAGNOSIS — T447X5A Adverse effect of beta-adrenoreceptor antagonists, initial encounter: Secondary | ICD-10-CM

## 2014-09-09 MED ORDER — ENSURE COMPLETE PO LIQD
237.0000 mL | Freq: Three times a day (TID) | ORAL | Status: DC
  Administered 2014-09-09 – 2014-09-10 (×4): 237 mL via ORAL
  Administered 2014-09-10: 120 mL via ORAL

## 2014-09-09 MED ORDER — NICOTINE 14 MG/24HR TD PT24
14.0000 mg | MEDICATED_PATCH | TRANSDERMAL | Status: DC
  Administered 2014-09-09 – 2014-09-22 (×14): 14 mg via TRANSDERMAL
  Filled 2014-09-09 (×14): qty 1

## 2014-09-09 NOTE — Evaluation (Signed)
Physical Therapy Evaluation Patient Details Name: Laurie Horne MRN: [id number] DOB: 02/17/1934 Today's Date: 09/09/2014   History of Present Illness  pt presents post falls at home with Intraventricluar Hemorrhage and Respiratory Failure.    Clinical Impression  Pt generally weak and debilitated.  Pt requires A for balance with OOB activity and fatigues quickly.  At this time pt will need SNF level of care at D/C unless plan is for D/C to inpatient psych.  Will continue to follow while on acute.      Follow Up Recommendations SNF (unless pt to go to Inpatient Psych.  )    Equipment Recommendations  None recommended by PT    Recommendations for Other Services       Precautions / Restrictions Precautions Precautions: Fall Precaution Comments: pt with SI comments with and safety sitter.   Restrictions Weight Bearing Restrictions: No      Mobility  Bed Mobility Overal bed mobility: Needs Assistance Bed Mobility: Supine to Sit;Sit to Supine     Supine to sit: Min assist Sit to supine: Min assist   General bed mobility comments: pt needs A with bringing trunk up to sitting and then bringing LEs back to bed.    Transfers Overall transfer level: Needs assistance Equipment used: 1 person hand held assist Transfers: Sit to/from Stand Sit to Stand: Min assist         General transfer comment: pt shaking during transfer and generally unsteady.  pt indicates fatigue and dizziness during standing.    Ambulation/Gait                Stairs            Wheelchair Mobility    Modified Rankin (Stroke Patients Only) Modified Rankin (Stroke Patients Only) Pre-Morbid Rankin Score: Slight disability Modified Rankin: Severe disability     Balance Overall balance assessment: Needs assistance Sitting-balance support: Bilateral upper extremity supported;Feet supported Sitting balance-Leahy Scale: Poor     Standing balance support: Bilateral upper extremity  supported;During functional activity Standing balance-Leahy Scale: Poor                               Pertinent Vitals/Pain Pain Assessment: No/denies pain    Home Living Family/patient expects to be discharged to:: Skilled nursing facility                 Additional Comments: Note possibility of D/C to inpatient psych facility, if not then will need SNF.      Prior Function Level of Independence: Independent with assistive device(s)         Comments: pt has a RW, but doesn't always use it.     Hand Dominance   Dominant Hand: Left    Extremity/Trunk Assessment   Upper Extremity Assessment: Generalized weakness           Lower Extremity Assessment: Generalized weakness      Cervical / Trunk Assessment: Normal  Communication   Communication: No difficulties  Cognition Arousal/Alertness: Awake/alert Behavior During Therapy: WFL for tasks assessed/performed Overall Cognitive Status: Impaired/Different from baseline Area of Impairment: Orientation;Attention;Memory;Safety/judgement;Awareness;Problem solving Orientation Level: Disoriented to;Situation;Time Current Attention Level: Selective Memory: Decreased short-term memory   Safety/Judgement: Decreased awareness of safety;Decreased awareness of deficits Awareness: Emergent Problem Solving: Slow processing;Difficulty sequencing;Requires verbal cues;Requires tactile cues      General Comments      Exercises        Assessment/Plan  PT Assessment Patient needs continued PT services  PT Diagnosis Difficulty walking   PT Problem List Decreased strength;Decreased activity tolerance;Decreased mobility;Decreased balance;Decreased coordination;Decreased cognition;Decreased knowledge of use of DME;Decreased safety awareness  PT Treatment Interventions DME instruction;Gait training;Functional mobility training;Therapeutic activities;Therapeutic exercise;Balance training;Neuromuscular  re-education;Patient/family education;Cognitive remediation   PT Goals (Current goals can be found in the Care Plan section) Acute Rehab PT Goals Patient Stated Goal: "When am I getting out of here?" PT Goal Formulation: With patient Time For Goal Achievement: 09/23/14 Potential to Achieve Goals: Fair    Frequency Min 2X/week   Barriers to discharge        Co-evaluation               End of Session Equipment Utilized During Treatment: Gait belt Activity Tolerance: Patient limited by fatigue Patient left: in bed;with call bell/phone within reach;with nursing/sitter in room Nurse Communication: Mobility status         Time: [id number] PT Time Calculation (min) (ACUTE ONLY): 28 min   Charges:   PT Evaluation $Initial PT Evaluation Tier I: 1 Procedure PT Treatments $Therapeutic Activity: 8-22 mins   PT G CodesSunny Horne:          Laurie Horne F, Laurie Horne [phone] 09/09/2014, 11:37 AM

## 2014-09-09 NOTE — Progress Notes (Signed)
eLink Physician-Brief Progress Note Patient Name: Laurie Horne DOB: 03/24/1934 MRN: [id number]   Date of Service  09/09/2014  HPI/Events of Note    eICU Interventions  Nicotine patch     Intervention Category Minor Interventions: Routine modifications to care plan (e.g. PRN medications for pain, fever)  MCQUAID, DOUGLAS 09/09/2014, 5:56 PM

## 2014-09-09 NOTE — Progress Notes (Signed)
NUTRITION FOLLOW-UP  DOCUMENTATION CODES Per approved criteria  -Severe malnutrition in the context of chronic illness -Underweight   INTERVENTION: Increase Ensure Complete po to QID, each supplement provides 350 kcal and 13 grams of protein  NUTRITION DIAGNOSIS: Malnurition related to chronic illness as evidenced by severe fat and muscle depletion; ongoing.   Goal: Pt to meet >/= 90% of their estimated nutrition needs; not met.    Monitor:  PO intake, supplement acceptance, weight trends, labs  ASSESSMENT: Pt admitted from home where she lives with her husband after being found unresponsive. Husband concerned that pt took a double dose of her medications. Per family she has been depressed lately.   Psych following and recommend pt go to geropsychiatry unit at [facility].  Pt discussed during ICU rounds and with RN.  Pt continues to chew food then spit it out.  Pt will drink ensure, pt drank some while I was in room with pt.    Height: Ht Readings from Last 1 Encounters:  09/07/14 _0  (1.651 m)    Weight: Wt Readings from Last 1 Encounters:  09/09/14 86 lb 10.3 oz (39.3 kg)    BMI:  Body mass index is 14.42 kg/(m^2).  Estimated Nutritional Needs: Kcal: 1300-1500 Protein: 60-75 grams Fluid: > 1.5 L/day  Skin: multiple scabbed areas to all extremities   Diet Order: Diet regular   Intake/Output Summary (Last 24 hours) at 09/09/14 1217 Last data filed at 09/09/14 1203  Gross per 24 hour  Intake    870 ml  Output   2700 ml  Net  -1830 ml    Last BM: 12/17   Labs:   Recent Labs Lab 09/07/14 0015 09/07/14 0032 09/07/14 0753  NA 134* 129* 131*  K 3.9 3.7 4.3  CL 85* 78* 88*  CO2 44*  --  36*  BUN _1 CREATININE 0.47* 0.70 0.34*  CALCIUM 9.6  --  8.9  GLUCOSE 91 88 113*    CBG (last 3)   Recent Labs  09/07/14 0019  GLUCAP 86    Scheduled Meds: . amLODipine  5 mg Oral Daily  . antiseptic oral rinse  7 mL Mouth Rinse q12n4p  . aspirin  81  mg Oral Daily  . brinzolamide  1 drop Both Eyes Daily  . chlorhexidine  15 mL Mouth Rinse BID  . feeding supplement (ENSURE COMPLETE)  237 mL Oral BID BM  . ipratropium-albuterol  3 mL Nebulization QID  . latanoprost  1 drop Both Eyes QHS  . multivitamin with minerals  1 tablet Oral Daily  . sodium chloride  3 mL Intravenous Q12H    Continuous Infusions:   Maylon Peppers RD, LDN, CNSC [phone] Pager [phone] After Hours Pager

## 2014-09-09 NOTE — Progress Notes (Signed)
Discussed discharge plan with MD and SW. Awaiting to hear if [facility] accomid[facility] today. If not, will transfer to floor.

## 2014-09-09 NOTE — Progress Notes (Signed)
Received consult re: pt's psych needs at d/c. Have spoken with unit CSW who indicated that the assigned Psych CSW is aware of this patient and working on her dispo.  I will wait to be advised if my services are required.   Carlyle LipaMichelle Fantashia Shupert, RN BSN MHA CCM  Case Manager, Trauma Service/Unit 67M [phone]

## 2014-09-09 NOTE — Progress Notes (Signed)
[facility] TEAM 1 - Stepdown/ICU TEAM Progress Note  Laurie Horne ZOX:[id number] DOB: [birth year] DOA: 09/06/2014 PCP: No primary care provider on file.  Admit HPI / Brief Narrative: 80 year old WF PMHx HTN, everyday smoker, Frequent Falls, Medication misuse, and presumed COPD.  She was admitted to [facility] in 10/2013 after falls with acute respiratory failure. She was intubated in ICU and eventually discharged to SNF. Family reports that she has been anorexic for about the past 5 years and barely eats. She chews her food and spits it out. Watching her weight has been very important to her. Weight in ED is 87lbs. After being discharged from SNF her USOH was significantly diminished when compared to that of before her hospitalization. Husband states that she has not been herself for some time. Over the past several months her abilities have been reduced. She can barely walk anymore according to her husband and has had several falls over the last 7 days. Her husband states that she has struck her head hard several times while falling. He is also concerned that she has been overdosing on her prescribed medications, he is not sure if this is intentional or not. For that reason he has been assisting her with administration. 12/15 he thinks she took her meds again after he gave them to her because he found her unresponsive on the cough in the PM. She was brought to [facility] ED and was noted to be in hypercarbic respiratory failure so she was placed on BiPAP. CT of the head was performed, which showed acute intracranial hemorrhage. Her mental status remains diminished. She was also bradycardic in ED. With concern that she overdosed on her home beta-blocker. She was given glucagon in ED with minimal response.    HPI/Subjective: 12/17 A/O 1 (does not know where, when, why), patient with significant short-term memory loss (i.e. unable to remember the name of nurse or myself 5 minutes after introduction). Request to go  home.  Assessment/Plan: Traumatic ICH in setting of frequent falls.  -Per neurosurgery no further diagnostic/therapeutic modalities required. -PT; recommends SNF, OT consult pending -Patient admits dizzy when she attempted to sit on edge of bed (felt like she was going to pass out)  Acute metabolic encephalopathy  -Most likely multifactorial to include acute ICH vs 2nd to ICH, UTI  Acute on chronic hypercarbic respiratory failure 2/2 presumed overdose  -Resolved; patient states does not use home O2  -Hx COPD without evidence of acute exacerbation -PRN BDs  Sinus bradycardia -Most likely secondary to beta blocker overdose; resolved  Hypotension  -Resolved -Patient now becoming hypertensive   HTN -Amlodipine 5 mg daily -Hydralazine PRN to maintain SBP<170  Hyponatremia -Check CMP  Hypochloremia -Check CMP  Protein calorie malnutrition -Diet regular with ensure between meals  Nicotine abuse -Continue nicotine patch  Depression -Per psychiatry patient is to be admitted to Geropsychiatry at [facility] in [place], when medically cleared     Code Status: DNR  Family Communication: no family present at time of exam Disposition Plan: Geropsychiatry at [place] in [place], when medically cleared    Consultants: NP Catha Nottingham Lord/Dr. Jama Flavors (psychiatry) Dr. Julio Sicks (neurosurgery)  Dr. Max Fickle [facility] M)    Procedure/Significant Events: 12/15 CT head without contrast;-Acute hemorrhage in the lateral ventricles without hydrocephalus. -Forehead contusion without fracture. - Chronic white matter disease and mild brain atrophy.    Culture 12/15 negative MRSA by PCR  12/15 blood right wrist/hand NGTS  Urine culture pending   Antibiotics: Keflex 12/18>>  DVT prophylaxis: SCD   Devices    LINES / TUBES:      Continuous Infusions:   Objective: VITAL SIGNS: Temp: 98.6 F (37 C) (12/17 1142) Temp Source: Oral (12/17 1142) BP:  148/86 mmHg (12/17 1800) Pulse Rate: 78 (12/17 1800) SPO2; FIO2:   Intake/Output Summary (Last 24 hours) at 09/09/14 1900 Last data filed at 09/09/14 1744  Gross per 24 hour  Intake    520 ml  Output   2600 ml  Net  -2080 ml     Exam: General: A/O x1 (does not know where, when, why), NAD, Anorexic, laceration on her forehead with eschar forming,  No acute respiratory distress Lungs: Clear to auscultation bilaterally without wheezes or crackles Cardiovascular: Regular rate and rhythm without murmur gallop or rub normal S1 and S2 Abdomen: Nontender, nondistended, soft, bowel sounds positive, no rebound, no ascites, no appreciable mass Extremities: No significant cyanosis, clubbing, or edema bilateral lower extremities Neurologic; Poor short term memory, CN II-XII intact Tongue/[place] midline, Extremity strength 5/5, sensation intact, Bilat Finger-nose-Finger some past pointing, unable to perform quick finger touches, sensation intact, did not attempt to ambulate Pt     Data Reviewed: Basic Metabolic Panel:  Recent Labs Lab 09/07/14 0015 09/07/14 0032 09/07/14 0753  NA 134* 129* 131*  K 3.9 3.7 4.3  CL 85* 78* 88*  CO2 44*  --  36*  GLUCOSE 91 88 113*  BUN 8 8 8   CREATININE 0.47* 0.70 0.34*  CALCIUM 9.6  --  8.9   Liver Function Tests:  Recent Labs Lab 09/07/14 0015  AST 18  ALT 9  ALKPHOS 99  BILITOT 0.3  PROT 6.8  ALBUMIN 3.3*   No results for input(s): LIPASE, AMYLASE in the last 168 hours. No results for input(s): AMMONIA in the last 168 hours. CBC:  Recent Labs Lab 09/07/14 0015 09/07/14 0032 09/07/14 0753  WBC 11.0*  --  8.4  NEUTROABS 8.3*  --   --   HGB 15.2* 18.0* 14.9  HCT 48.6* 53.0* 48.2*  MCV 94.7  --  94.7  PLT 185  --  144*   Cardiac Enzymes:  Recent Labs Lab 09/07/14 0753  TROPONINI <0.30   BNP (last 3 results)  Recent Labs  11/21/13 1550  PROBNP 16993.0*   CBG:  Recent Labs Lab 09/07/14 0019  GLUCAP 86    Recent  Results (from the past 240 hour(s))  MRSA PCR Screening     Status: None   Collection Time: 09/07/14  5:55 AM  Result Value Ref Range Status   MRSA by PCR NEGATIVE NEGATIVE Final    Comment:        The GeneXpert MRSA Assay (FDA approved for NASAL specimens only), is one component of a comprehensive MRSA colonization surveillance program. It is not intended to diagnose MRSA infection nor to guide or monitor treatment for MRSA infections.   Culture, blood (routine x 2)     Status: None (Preliminary result)   Collection Time: 09/07/14  7:40 AM  Result Value Ref Range Status   Specimen Description BLOOD RIGHT WRIST  Final   Special Requests BOTTLES DRAWN AEROBIC ONLY 3CC  Final   Culture  Setup Time   Final    09/07/2014 13:12 Performed at [facility]    Culture   Final           BLOOD CULTURE RECEIVED NO GROWTH TO DATE CULTURE WILL BE HELD FOR 5 DAYS BEFORE ISSUING A FINAL NEGATIVE REPORT Performed  at [facility]    Report Status PENDING  Incomplete  Culture, blood (routine x 2)     Status: None (Preliminary result)   Collection Time: 09/07/14  7:54 AM  Result Value Ref Range Status   Specimen Description BLOOD RIGHT HAND  Final   Special Requests BOTTLES DRAWN AEROBIC ONLY 2.5CC  Final   Culture  Setup Time   Final    09/07/2014 13:12 Performed at [facility]    Culture   Final           BLOOD CULTURE RECEIVED NO GROWTH TO DATE CULTURE WILL BE HELD FOR 5 DAYS BEFORE ISSUING A FINAL NEGATIVE REPORT Note: Culture results may be compromised due to an inadequate volume of blood received in culture bottles. Performed at [facility]    Report Status PENDING  Incomplete     Studies:  Recent x-ray studies have been reviewed in detail by the Attending Physician  Scheduled Meds:  Scheduled Meds: . amLODipine  5 mg Oral Daily  . antiseptic oral rinse  7 mL Mouth Rinse q12n4p  . aspirin  81 mg Oral Daily  . brinzolamide  1 drop Both Eyes  Daily  . chlorhexidine  15 mL Mouth Rinse BID  . feeding supplement (ENSURE COMPLETE)  237 mL Oral TID PC & HS  . ipratropium-albuterol  3 mL Nebulization QID  . latanoprost  1 drop Both Eyes QHS  . multivitamin with minerals  1 tablet Oral Daily  . nicotine  14 mg Transdermal Q24H  . sodium chloride  3 mL Intravenous Q12H    Time spent on care of this patient: 40 mins   Drema DallasWOODS, CURTIS, J , MD   [facility] Office  [phone] Pager - [phone]  On-Call/Text Page:      [email or web address]      password [email or web address]  If 7PM-7AM, please contact night-coverage [email or web address] Password [email or web address] 09/09/2014, 7:00 PM   LOS: 3 days

## 2014-09-10 DIAGNOSIS — I9589 Other hypotension: Secondary | ICD-10-CM | POA: Insufficient documentation

## 2014-09-10 DIAGNOSIS — E46 Unspecified protein-calorie malnutrition: Secondary | ICD-10-CM | POA: Insufficient documentation

## 2014-09-10 DIAGNOSIS — R001 Bradycardia, unspecified: Secondary | ICD-10-CM | POA: Insufficient documentation

## 2014-09-10 DIAGNOSIS — R404 Transient alteration of awareness: Secondary | ICD-10-CM | POA: Insufficient documentation

## 2014-09-10 DIAGNOSIS — I10 Essential (primary) hypertension: Secondary | ICD-10-CM | POA: Insufficient documentation

## 2014-09-10 DIAGNOSIS — J9622 Acute and chronic respiratory failure with hypercapnia: Secondary | ICD-10-CM | POA: Insufficient documentation

## 2014-09-10 DIAGNOSIS — I629 Nontraumatic intracranial hemorrhage, unspecified: Secondary | ICD-10-CM | POA: Insufficient documentation

## 2014-09-10 DIAGNOSIS — Z720 Tobacco use: Secondary | ICD-10-CM | POA: Insufficient documentation

## 2014-09-10 DIAGNOSIS — E871 Hypo-osmolality and hyponatremia: Secondary | ICD-10-CM | POA: Insufficient documentation

## 2014-09-10 DIAGNOSIS — T447X5A Adverse effect of beta-adrenoreceptor antagonists, initial encounter: Secondary | ICD-10-CM | POA: Insufficient documentation

## 2014-09-10 LAB — CBC WITH DIFFERENTIAL/PLATELET
BASOS PCT: 0 % (ref 0–1)
Basophils Absolute: 0 10*3/uL (ref 0.0–0.1)
Eosinophils Absolute: 0.1 10*3/uL (ref 0.0–0.7)
Eosinophils Relative: 1 % (ref 0–5)
HCT: 48.9 % — ABNORMAL HIGH (ref 36.0–46.0)
HEMOGLOBIN: 15.6 g/dL — AB (ref 12.0–15.0)
Lymphocytes Relative: 13 % (ref 12–46)
Lymphs Abs: 1.3 10*3/uL (ref 0.7–4.0)
MCH: 28.5 pg (ref 26.0–34.0)
MCHC: 31.9 g/dL (ref 30.0–36.0)
MCV: 89.4 fL (ref 78.0–100.0)
MONOS PCT: 7 % (ref 3–12)
Monocytes Absolute: 0.7 10*3/uL (ref 0.1–1.0)
NEUTROS PCT: 79 % — AB (ref 43–77)
Neutro Abs: 8.2 10*3/uL — ABNORMAL HIGH (ref 1.7–7.7)
Platelets: 160 10*3/uL (ref 150–400)
RBC: 5.47 MIL/uL — AB (ref 3.87–5.11)
RDW: 12.7 % (ref 11.5–15.5)
WBC: 10.4 10*3/uL (ref 4.0–10.5)

## 2014-09-10 LAB — COMPREHENSIVE METABOLIC PANEL
ALT: 11 U/L (ref 0–35)
AST: 21 U/L (ref 0–37)
Albumin: 3.2 g/dL — ABNORMAL LOW (ref 3.5–5.2)
Alkaline Phosphatase: 98 U/L (ref 39–117)
Anion gap: 13 (ref 5–15)
BUN: 6 mg/dL (ref 6–23)
CALCIUM: 9.6 mg/dL (ref 8.4–10.5)
CO2: 28 mEq/L (ref 19–32)
Chloride: 88 mEq/L — ABNORMAL LOW (ref 96–112)
Creatinine, Ser: 0.33 mg/dL — ABNORMAL LOW (ref 0.50–1.10)
GFR calc non Af Amer: >90 mL/min (ref >90)
GLUCOSE: 146 mg/dL — AB (ref 70–99)
Potassium: 4.9 mEq/L (ref 3.7–5.3)
Sodium: 129 mEq/L — ABNORMAL LOW (ref 137–147)
TOTAL PROTEIN: 6.6 g/dL (ref 6.0–8.3)
Total Bilirubin: 0.7 mg/dL (ref 0.3–1.2)

## 2014-09-10 LAB — MAGNESIUM: Magnesium: 1.9 mg/dL (ref 1.5–2.5)

## 2014-09-10 LAB — CORTISOL-AM, BLOOD: Cortisol - AM: 14.2 ug/dL (ref 4.3–22.4)

## 2014-09-10 MED ORDER — CEPHALEXIN 500 MG PO CAPS
500.0000 mg | ORAL_CAPSULE | Freq: Two times a day (BID) | ORAL | Status: DC
  Administered 2014-09-10: 500 mg via ORAL
  Filled 2014-09-10 (×2): qty 1

## 2014-09-10 MED ORDER — HYDRALAZINE HCL 20 MG/ML IJ SOLN: 10.0000 mg | INTRAMUSCULAR | Status: DC | PRN

## 2014-09-10 MED ORDER — CEFTRIAXONE SODIUM IN DEXTROSE 20 MG/ML IV SOLN
1.0000 g | INTRAVENOUS | Status: DC
  Administered 2014-09-10 – 2014-09-14 (×5): 1 g via INTRAVENOUS
  Filled 2014-09-10 (×6): qty 50

## 2014-09-10 NOTE — Progress Notes (Signed)
ANTIBIOTIC CONSULT NOTE - INITIAL  Pharmacy Consult for Ceftriaxone Indication: UTI  Allergies  Allergen Reactions  . Codeine Other (See Comments)    unknown    Patient Measurements: Height: 5\' 5"  (165.1 cm) Weight: 90 lb 9.7 oz (41.1 kg) IBW/kg (Calculated) : 57  Vital Signs: Temp: 98.5 F (36.9 C) (12/18 0752) Temp Source: Oral (12/18 0752) BP: 174/110 mmHg (12/18 0800) Pulse Rate: 78 (12/18 0800) Intake/Output from previous day: 12/17 0701 - 12/18 0700 In: 120 [P.O.:120] Out: 1925 [Urine:1925] Intake/Output from this shift: Total I/O In: 115 [P.O.:115] Out: 100 [Urine:100]  Labs:  Recent Labs  09/10/14 1010  WBC 10.4  HGB 15.6*  PLT 160  CREATININE 0.33*   Estimated Creatinine Clearance: 37.6 mL/min (by C-G formula based on Cr of 0.33). No results for input(s): VANCOTROUGH, VANCOPEAK, VANCORANDOM, GENTTROUGH, GENTPEAK, GENTRANDOM, TOBRATROUGH, TOBRAPEAK, TOBRARND, AMIKACINPEAK, AMIKACINTROU, AMIKACIN in the last 72 hours.   Microbiology: Recent Results (from the past 720 hour(s))  MRSA PCR Screening     Status: None   Collection Time: 09/07/14  5:55 AM  Result Value Ref Range Status   MRSA by PCR NEGATIVE NEGATIVE Final    Comment:        The GeneXpert MRSA Assay (FDA approved for NASAL specimens only), is one component of a comprehensive MRSA colonization surveillance program. It is not intended to diagnose MRSA infection nor to guide or monitor treatment for MRSA infections.   Culture, blood (routine x 2)     Status: None (Preliminary result)   Collection Time: 09/07/14  7:40 AM  Result Value Ref Range Status   Specimen Description BLOOD RIGHT WRIST  Final   Special Requests BOTTLES DRAWN AEROBIC ONLY 3CC  Final   Culture  Setup Time   Final    09/07/2014 13:12 Performed at [facility]    Culture   Final           BLOOD CULTURE RECEIVED NO GROWTH TO DATE CULTURE WILL BE HELD FOR 5 DAYS BEFORE ISSUING A FINAL NEGATIVE  REPORT Performed at [facility]    Report Status PENDING  Incomplete  Culture, blood (routine x 2)     Status: None (Preliminary result)   Collection Time: 09/07/14  7:54 AM  Result Value Ref Range Status   Specimen Description BLOOD RIGHT HAND  Final   Special Requests BOTTLES DRAWN AEROBIC ONLY 2.5CC  Final   Culture  Setup Time   Final    09/07/2014 13:12 Performed at [facility]    Culture   Final           BLOOD CULTURE RECEIVED NO GROWTH TO DATE CULTURE WILL BE HELD FOR 5 DAYS BEFORE ISSUING A FINAL NEGATIVE REPORT Note: Culture results may be compromised due to an inadequate volume of blood received in culture bottles. Performed at [facility]    Report Status PENDING  Incomplete    Medical History: Past Medical History  Diagnosis Date  . Hyperlipidemia   . Hypertension   . History of tobacco abuse     Medications:  Scheduled:  . amLODipine  5 mg Oral Daily  . antiseptic oral rinse  7 mL Mouth Rinse q12n4p  . aspirin  81 mg Oral Daily  . brinzolamide  1 drop Both Eyes Daily  . cefTRIAXone (ROCEPHIN)  IV  1 g Intravenous Q24H  . chlorhexidine  15 mL Mouth Rinse BID  . feeding supplement (ENSURE COMPLETE)  237 mL Oral TID PC &  HS  . ipratropium-albuterol  3 mL Nebulization QID  . latanoprost  1 drop Both Eyes QHS  . multivitamin with minerals  1 tablet Oral Daily  . nicotine  14 mg Transdermal Q24H  . sodium chloride  3 mL Intravenous Q12H   Assessment: 80 y.o. female admitted 12/15 with AMS. Pt now to start Rocephin for UTI. Urine cx pending. Afeb. WBC wnl.   Goal of Therapy:  Resolution of infection  Plan:  1. Rocephin 1gm IV q24h 2. Pharmacy will sign off as Rocephin does not require dose adjustments  Christoper Fabianaron Ryen Heitmeyer, PharmD, BCPS Clinical pharmacist, pager [phone] 09/10/2014,11:58 AM

## 2014-09-10 NOTE — Progress Notes (Signed)
Medicare IM (Important Message) delivered to patient today by me in anticipation of discharge.   Arcangel Minion, RN BSN MHA CCM  Case Manager, Trauma Service/Unit 3M [phone]  

## 2014-09-10 NOTE — Progress Notes (Signed)
TEAM 1 - Stepdown/ICU TEAM Progress Note  Laurie Horne ZOX:[id number] DOB: 11/07/1933 DOA: 09/06/2014 PCP: No primary care provider on file.  Admit HPI / Brief Narrative: 80 year old WF PMHx HTN, everyday smoker, Frequent Falls, Medication misuse, and presumed COPD.  [facility] was [facility] after falls with acute respiratory failure. She was intubated in ICU and eventually discharged to SNF. Family reports that she has been anorexic for about the past 5 years and barely eats. She chews her food and spits it out. Watching her weight has been very important to her. Weight in ED is 87lbs. After being discharged from SNF her USOH was significantly diminished when compared to that of before her hospitalization. Husband states that she has not been herself for some time. Over the past several months her abilities have been reduced. She can barely walk anymore according to her husband and has had several falls over the last 7 days. Her husband states that she has struck her head hard several times while falling. He is also concerned that she has been overdosing on her prescribed medications, he is not sure if this is intentional or not. For that reason he has been assisting her with administration. 12/15 he thinks she took her meds again after he gave them to her because he found her unresponsive on the cough in the PM. [facility] and was noted to be in hypercarbic respiratory failure so she was placed on BiPAP. CT of the head was performed, which showed acute intracranial hemorrhage. Her mental status remains diminished. She was also bradycardic in ED. With concern that she overdosed on her home beta-blocker. She was given glucagon in ED with minimal response.    HPI/Subjective: 12/18 A/O 4. [facility]  Assessment/Plan: Traumatic ICH in setting of frequent falls.  -Per neurosurgery no further diagnostic/therapeutic modalities  required. -PT; recommends SNF, OT consult pending -Patient still in bed, or some place for OOB to chair w/ assistance  Acute metabolic encephalopathy  -Most likely multifactorial to include acute ICH vs 2nd to ICH, UTI  Acute on chronic hypercarbic respiratory failure 2/2 presumed overdose  -Resolved; patient states does not use home O2  -Hx COPD without evidence of acute exacerbation -PRN BDs  Sinus bradycardia -Most likely secondary to beta blocker overdose; resolved  Hypotension  -Resolved -Patient now becoming hypertensive   HTN -Amlodipine 5 mg daily -Hydralazine PRN for SBP> 180 or DBP > 100  UTI -Urine pending -Stop Cephalexin and start Ceftriaxone   Hyponatremia -Most likely secondary to CVA causing SIADH, will fluid restrict to 104400ml/dy   Hypochloremia -Check CMP  Protein calorie malnutrition -Diet regular with ensure between meals  Nicotine abuse -Continue nicotine patch  Depression -Per psychiatry patient is to be admitted [facility] [place], when medically cleared     Code Status: DNR  Family Communication: Husband and son  present at time of exam Disposition Plan: [facility] [place], when medically cleared    Consultants: NP Catha NottinghamJamison Lord/Dr. Jama Flavorsobos (psychiatry) Dr. Julio SicksHenry Pool (neurosurgery)  Dr. Max [facility])    Procedure/Significant Events: 12/15 CT head without contrast;-Acute hemorrhage in the lateral ventricles without hydrocephalus. -Forehead contusion without fracture. - Chronic white matter disease and mild brain atrophy.    Culture 12/15 negative MRSA by PCR  12/15 blood right wrist/hand NGTD  Urine culture pending   Antibiotics: Keflex 12/18>> stopped 12/18 Ceftriaxone 12/18>>  DVT prophylaxis: SCD   Devices    LINES / TUBES:      Continuous Infusions:   Objective: VITAL SIGNS: Temp: 98.5 F (36.9 C) (12/18 0752) Temp Source: Oral (12/18  0752) BP: 174/110 mmHg (12/18 0800) Pulse Rate: 78 (12/18 0800) SPO2; FIO2:   Intake/Output Summary (Last 24 hours) at 09/10/14 1125 Last data filed at 09/10/14 0915  Gross per 24 hour  Intake    115 ml  Output   1325 ml  Net  -1210 ml     Exam: General: A/O x4, NAD, Anorexic, laceration on her forehead with eschar covered with clean dressing,  No acute respiratory distress Lungs: Clear to auscultation bilaterally without wheezes or crackles Cardiovascular: Regular rate and rhythm without murmur gallop or rub normal S1 and S2 Abdomen: Nontender, nondistended, soft, bowel sounds positive, no rebound, no ascites, no appreciable mass Extremities: No significant cyanosis, clubbing, or edema bilateral lower extremities Neurologic; improving cognition, no additional changes from yesterday      Data Reviewed: Basic Metabolic Panel:  Recent Labs Lab 09/07/14 0015 09/07/14 0032 09/07/14 0753 09/10/14 1010  NA 134* 129* 131* 129*  K 3.9 3.7 4.3 4.9  CL 85* 78* 88* 88*  CO2 44*  --  36* 28  GLUCOSE 91 88 113* 146*  BUN 8 8 8 6   CREATININE 0.47* 0.70 0.34* 0.33*  CALCIUM 9.6  --  8.9 9.6  MG  --   --   --  1.9   Liver Function Tests:  Recent Labs Lab 09/07/14 0015 09/10/14 1010  AST 18 21  ALT 9 11  ALKPHOS 99 98  BILITOT 0.3 0.7  PROT 6.8 6.6  ALBUMIN 3.3* 3.2*   No results for input(s): LIPASE, AMYLASE in the last 168 hours. No results for input(s): AMMONIA in the last 168 hours. CBC:  Recent Labs Lab 09/07/14 0015 09/07/14 0032 09/07/14 0753 09/10/14 1010  WBC 11.0*  --  8.4 10.4  NEUTROABS 8.3*  --   --  8.2*  HGB 15.2* 18.0* 14.9 15.6*  HCT 48.6* 53.0* 48.2* 48.9*  MCV 94.7  --  94.7 89.4  PLT 185  --  144* 160   Cardiac Enzymes:  Recent Labs Lab 09/07/14 0753  TROPONINI <0.30   BNP (last 3 results)  Recent Labs  11/21/13 1550  PROBNP 16993.0*   CBG:  Recent Labs Lab 09/07/14 0019  GLUCAP 86    Recent Results (from the past 240  hour(s))  MRSA PCR Screening     Status: None   Collection Time: 09/07/14  5:55 AM  Result Value Ref Range Status   MRSA by PCR NEGATIVE NEGATIVE Final    Comment:        The GeneXpert MRSA Assay (FDA approved for NASAL specimens only), is one component of a comprehensive MRSA colonization surveillance program. It is not intended to diagnose MRSA infection nor to guide or monitor treatment for MRSA infections.   Culture, blood (routine x 2)     Status: None (Preliminary result)   Collection Time: 09/07/14  7:40 AM  Result Value Ref Range Status   Specimen Description BLOOD RIGHT WRIST  Final   Special Requests BOTTLES DRAWN AEROBIC ONLY 3CC  Final   Culture  Setup Time   Final    09/07/2014 13:12 Performed at [facility]    Culture   Final           BLOOD CULTURE RECEIVED NO GROWTH TO DATE CULTURE WILL BE HELD  FOR 5 DAYS BEFORE ISSUING A FINAL NEGATIVE REPORT Performed at [facility]    Report Status PENDING  Incomplete  Culture, blood (routine x 2)     Status: None (Preliminary result)   Collection Time: 09/07/14  7:54 AM  Result Value Ref Range Status   Specimen Description BLOOD RIGHT HAND  Final   Special Requests BOTTLES DRAWN AEROBIC ONLY 2.5CC  Final   Culture  Setup Time   Final    09/07/2014 13:12 Performed at [facility]    Culture   Final           BLOOD CULTURE RECEIVED NO GROWTH TO DATE CULTURE WILL BE HELD FOR 5 DAYS BEFORE ISSUING A FINAL NEGATIVE REPORT Note: Culture results may be compromised due to an inadequate volume of blood received in culture bottles. Performed at [facility]    Report Status PENDING  Incomplete     Studies:  Recent x-ray studies have been reviewed in detail by the Attending Physician  Scheduled Meds:  Scheduled Meds: . amLODipine  5 mg Oral Daily  . antiseptic oral rinse  7 mL Mouth Rinse q12n4p  . aspirin  81 mg Oral Daily  . brinzolamide  1 drop Both Eyes Daily  . chlorhexidine  15  mL Mouth Rinse BID  . feeding supplement (ENSURE COMPLETE)  237 mL Oral TID PC & HS  . ipratropium-albuterol  3 mL Nebulization QID  . latanoprost  1 drop Both Eyes QHS  . multivitamin with minerals  1 tablet Oral Daily  . nicotine  14 mg Transdermal Q24H  . sodium chloride  3 mL Intravenous Q12H    Time spent on care of this patient: 40 mins   Drema DallasWOODS, Sireen Halk, J , MD   [facility] Office  [phone] Pager - [phone]  On-Call/Text Page:      [email or web address] [email or web address]      password [email or web address]  If 7PM-7AM, please contact night-coverage [email or web address] Password [email or web address] 09/10/2014, 11:25 AM   LOS: 4 days

## 2014-09-10 NOTE — Consult Note (Signed)
[facility] Face-to-Face Psychiatry Consult   Reason for Consult:  Rule out depression, questionable overdose on HTN medications Referring Physician:  Primary MD  Laurie Horne is an 80 y.o. female. Total Time spent with patient: 1 hour  Assessment: AXIS I:   Anxiety; depression NOS AXIS II:  Deferred AXIS III:   Past Medical History  Diagnosis Date  . Hyperlipidemia   . Hypertension   . History of tobacco abuse    AXIS IV:  chronic mental and physical conditions AXIS V:  11-20 some danger of hurting self or others possible OR occasionally fails to maintain minimal personal hygiene OR gross impairment in communication  Plan:  Recommend psychiatric Inpatient admission when medically cleared. When stable and medically cleared, admit to geropsychiatry at [facility] in [place].  Dr. Parke Horne reviewed the patient and concurs with the plan  Subjective:   Laurie Horne is a 80 y.o. female patient admitted with depression once medical clearance is given.  HPI:  The patient was more alert today with more interaction.  Her husband reported she stated she was hungry last night and ate a fruit cup and some food today.  The patient is upset when she cannot remember this providers name and states she won't remember it but frustrated, name written on her board for assistance.  She is focused on "getting out of here and to [facility]", a nursing home facility she was at last spring and will move to after her psychiatric admission.  Laurie Horne kept saying [facility] and asking if it was manor and was it the correct name, frustrated with lack of memory.  Baseline is difficult to determine due to confounding factors of overdose and urinary tract infection.   HPI Elements:   Location:  generalized. Quality:  acute. Severity:  severe. Timing:  constant. Duration:  months. Context:  decrease in health .  Past Psychiatric History: Past Medical History  Diagnosis Date  . Hyperlipidemia   . Hypertension    . History of tobacco abuse     reports that she has been smoking Cigarettes.  She has been smoking about 0.25 packs per day. She does not have any smokeless tobacco history on file. She reports that she does not drink alcohol. Her drug history is not on file. History reviewed. No pertinent family history.         Allergies:   Allergies  Allergen Reactions  . Codeine Other (See Comments)    unknown    ACT Assessment Complete:  No:   Past Psychiatric History: Diagnosis:  Depression NOS, anxiety  Hospitalizations:  No psychiatric hospitalization  Outpatient Care:  None for mental health  Substance Abuse Care:  N/A  Self-Mutilation:  None  Suicidal Attempts:  Questionable overdose  Homicidal Behaviors:  None   Violent Behaviors:  None   Place of Residence:  [place] Marital Status:  Married  Employed/Unemployed:  Retired Education:  High school Family Supports:  Husband and 3 sons Objective: Blood pressure 144/78, pulse 72, temperature 97.9 F (36.6 C), temperature source Oral, resp. rate 24, height $RemoveBe'5\' 5"'AldsSBDdK$  (1.651 m), weight 90 lb 9.7 oz (41.1 kg), SpO2 97 %.Body mass index is 15.08 kg/(m^2). Results for orders placed or performed during the hospital encounter of 09/06/14 (from the past 72 hour(s))  Culture, blood (routine x 2)     Status: None (Preliminary result)   Collection Time: 09/07/14  7:40 AM  Result Value Ref Range   Specimen Description BLOOD RIGHT WRIST    Special  Requests BOTTLES DRAWN AEROBIC ONLY 3CC    Culture  Setup Time      09/07/2014 13:12 Performed at [facility]    Culture             BLOOD CULTURE RECEIVED NO GROWTH TO DATE CULTURE WILL BE HELD FOR 5 DAYS BEFORE ISSUING A FINAL NEGATIVE REPORT Performed at [facility]    Report Status PENDING   Basic metabolic panel     Status: Abnormal   Collection Time: 09/07/14  7:53 AM  Result Value Ref Range   Sodium 131 (L) 137 - 147 mEq/L   Potassium 4.3 3.7 - 5.3 mEq/L   Chloride 88  (L) 96 - 112 mEq/L   CO2 36 (H) 19 - 32 mEq/L   Glucose, Bld 113 (H) 70 - 99 mg/dL   BUN 8 6 - 23 mg/dL   Creatinine, Ser 5.39 (L) 0.50 - 1.10 mg/dL    Comment: DELTA CHECK NOTED   Calcium 8.9 8.4 - 10.5 mg/dL   GFR calc non Af Amer >90 >90 mL/min   GFR calc Af Amer >90 >90 mL/min    Comment: (NOTE) The eGFR has been calculated using the CKD EPI equation. This calculation has not been validated in all clinical situations. eGFR's persistently <90 mL/min signify possible Chronic Kidney Disease.    Anion gap 7 5 - 15  CBC     Status: Abnormal   Collection Time: 09/07/14  7:53 AM  Result Value Ref Range   WBC 8.4 4.0 - 10.5 K/uL   RBC 5.09 3.87 - 5.11 MIL/uL   Hemoglobin 14.9 12.0 - 15.0 g/dL    Comment: REPEATED TO VERIFY   HCT 48.2 (H) 36.0 - 46.0 %   MCV 94.7 78.0 - 100.0 fL   MCH 29.3 26.0 - 34.0 pg   MCHC 30.9 30.0 - 36.0 g/dL   RDW 70.0 56.7 - 47.6 %   Platelets 144 (L) 150 - 400 K/uL  Protime-INR     Status: None   Collection Time: 09/07/14  7:53 AM  Result Value Ref Range   Prothrombin Time 13.8 11.6 - 15.2 seconds   INR 1.05 0.00 - 1.49  Troponin I     Status: None   Collection Time: 09/07/14  7:53 AM  Result Value Ref Range   Troponin I <0.30 <0.30 ng/mL    Comment:        Due to the release kinetics of cTnI, a negative result within the first hours of the onset of symptoms does not rule out myocardial infarction with certainty. If myocardial infarction is still suspected, repeat the test at appropriate intervals.   Culture, blood (routine x 2)     Status: None (Preliminary result)   Collection Time: 09/07/14  7:54 AM  Result Value Ref Range   Specimen Description BLOOD RIGHT HAND    Special Requests BOTTLES DRAWN AEROBIC ONLY 2.5CC    Culture  Setup Time      09/07/2014 13:12 Performed at [facility]    Culture             BLOOD CULTURE RECEIVED NO GROWTH TO DATE CULTURE WILL BE HELD FOR 5 DAYS BEFORE ISSUING A FINAL NEGATIVE REPORT Note:  Culture results may be compromised due to an inadequate volume of blood received in culture bottles. Performed at [facility]    Report Status PENDING   TSH     Status: None   Collection Time: 09/08/14 11:00 PM  Result Value Ref Range   TSH 3.060 0.350 - 4.500 uIU/mL  Cortisol-am, blood     Status: None   Collection Time: 09/08/14 11:00 PM  Result Value Ref Range   Cortisol - AM 14.2 4.3 - 22.4 ug/dL    Comment: Performed at [facility] are reviewed and are pertinent for medical issues being addressed.  Current Facility-Administered Medications  Medication Dose Route Frequency Provider Last Rate Last Dose  . 0.9 %  sodium chloride infusion  250 mL Intravenous PRN Corey Harold, NP   Stopped at 09/07/14 1404  . albuterol (PROVENTIL) (2.5 MG/3ML) 0.083% nebulizer solution 2.5 mg  2.5 mg Nebulization Q2H PRN Corey Harold, NP      . ALPRAZolam Duanne Moron) tablet 0.25 mg  0.25 mg Oral TID PRN Chesley Mires, MD   0.25 mg at 09/09/14 0517  . amLODipine (NORVASC) tablet 5 mg  5 mg Oral Daily Wilhelmina Mcardle, MD   5 mg at 09/09/14 0949  . antiseptic oral rinse (CPC / CETYLPYRIDINIUM CHLORIDE 0.05%) solution 7 mL  7 mL Mouth Rinse q12n4p Corey Harold, NP   7 mL at 09/09/14 1600  . aspirin chewable tablet 81 mg  81 mg Oral Daily Juanito Doom, MD   81 mg at 09/09/14 0949  . brinzolamide (AZOPT) 1 % ophthalmic suspension 1 drop  1 drop Both Eyes Daily Wilhelmina Mcardle, MD   1 drop at 09/09/14 0949  . cephALEXin (KEFLEX) capsule 500 mg  500 mg Oral Q12H Waylan Boga, NP      . chlorhexidine (PERIDEX) 0.12 % solution 15 mL  15 mL Mouth Rinse BID Corey Harold, NP   15 mL at 09/09/14 1930  . feeding supplement (ENSURE COMPLETE) (ENSURE COMPLETE) liquid 237 mL  237 mL Oral TID PC & HS Heather Cornelison Pitts, RD   237 mL at 09/09/14 1938  . hydrALAZINE (APRESOLINE) injection 10-20 mg  10-20 mg Intravenous Q4H PRN Wilhelmina Mcardle, MD   10 mg at 09/09/14 0411  .  ipratropium-albuterol (DUONEB) 0.5-2.5 (3) MG/3ML nebulizer solution 3 mL  3 mL Nebulization QID Wilhelmina Mcardle, MD   3 mL at 09/09/14 2000  . latanoprost (XALATAN) 0.005 % ophthalmic solution 1 drop  1 drop Both Eyes QHS Wilhelmina Mcardle, MD   1 drop at 09/09/14 2144  . multivitamin with minerals tablet 1 tablet  1 tablet Oral Daily Wilhelmina Mcardle, MD   1 tablet at 09/09/14 [phone]  . nicotine (NICODERM CQ - dosed in mg/24 hours) patch 14 mg  14 mg Transdermal Q24H Juanito Doom, MD   14 mg at 09/09/14 1934  . sodium chloride 0.9 % injection 3 mL  3 mL Intravenous Q12H Juanito Doom, MD   3 mL at 09/09/14 2145  . sodium chloride 0.9 % injection 3 mL  3 mL Intravenous PRN Juanito Doom, MD        Psychiatric Specialty Exam:     Blood pressure 144/78, pulse 72, temperature 97.9 F (36.6 C), temperature source Oral, resp. rate 24, height $RemoveBe'5\' 5"'TShDxQupE$  (1.651 m), weight 90 lb 9.7 oz (41.1 kg), SpO2 97 %.Body mass index is 15.08 kg/(m^2).  General Appearance: Casual  Eye Contact::  Fair  Speech:  Slow  Volume:  Decreased  Mood:  Depressed  Affect:  Blunt  Thought Process:  Coherent but confused at times  Orientation:  Full (Time, Place, and Person)  Thought Content:  WDL  Suicidal Thoughts:  Yes.  without intent/plan  Homicidal Thoughts:  No  Memory:  Immediate;   Poor Recent;   Poor Remote;   Poor  Judgement:  Impaired  Insight:  Fair  Psychomotor Activity:  Decreased  Concentration:  Poor  Recall:  Poor  Fund of Knowledge:Fair  Language: Fair  Akathisia:  No  Handed:  Right  AIMS (if indicated):     Assets:  Financial Resources/Insurance Housing Resilience Social Support  Sleep:      Musculoskeletal: Strength & Muscle Tone: decreased Gait & Station: did not see her stand  Patient leans: N/A  Treatment Plan Summary: Daily contact with patient to assess and evaluate symptoms and progress in treatment Medication management; stabilize medically and admit to  geropsychiatry at [facility]  Waylan Boga, [place] 09/10/2014 6:55 AM   Case discussed with me as above

## 2014-09-11 DIAGNOSIS — E875 Hyperkalemia: Secondary | ICD-10-CM | POA: Insufficient documentation

## 2014-09-11 DIAGNOSIS — S06309A Unspecified focal traumatic brain injury with loss of consciousness of unspecified duration, initial encounter: Secondary | ICD-10-CM | POA: Insufficient documentation

## 2014-09-11 DIAGNOSIS — S0630AA Unspecified focal traumatic brain injury with loss of consciousness status unknown, initial encounter: Secondary | ICD-10-CM | POA: Insufficient documentation

## 2014-09-11 DIAGNOSIS — T1491XA Suicide attempt, initial encounter: Secondary | ICD-10-CM | POA: Insufficient documentation

## 2014-09-11 DIAGNOSIS — G9341 Metabolic encephalopathy: Secondary | ICD-10-CM | POA: Insufficient documentation

## 2014-09-11 DIAGNOSIS — T1491 Suicide attempt: Secondary | ICD-10-CM

## 2014-09-11 DIAGNOSIS — F411 Generalized anxiety disorder: Secondary | ICD-10-CM | POA: Insufficient documentation

## 2014-09-11 DIAGNOSIS — L989 Disorder of the skin and subcutaneous tissue, unspecified: Secondary | ICD-10-CM | POA: Insufficient documentation

## 2014-09-11 DIAGNOSIS — S06369S Traumatic hemorrhage of cerebrum, unspecified, with loss of consciousness of unspecified duration, sequela: Secondary | ICD-10-CM

## 2014-09-11 LAB — MAGNESIUM: Magnesium: 2.2 mg/dL (ref 1.5–2.5)

## 2014-09-11 LAB — CBC WITH DIFFERENTIAL/PLATELET
BASOS PCT: 1 % (ref 0–1)
Basophils Absolute: 0.1 10*3/uL (ref 0.0–0.1)
EOS ABS: 0.1 10*3/uL (ref 0.0–0.7)
EOS PCT: 1 % (ref 0–5)
HEMATOCRIT: 44.1 % (ref 36.0–46.0)
HEMOGLOBIN: 14.5 g/dL (ref 12.0–15.0)
Lymphocytes Relative: 18 % (ref 12–46)
Lymphs Abs: 2 10*3/uL (ref 0.7–4.0)
MCH: 29.2 pg (ref 26.0–34.0)
MCHC: 32.9 g/dL (ref 30.0–36.0)
MCV: 88.9 fL (ref 78.0–100.0)
MONOS PCT: 9 % (ref 3–12)
Monocytes Absolute: 1 10*3/uL (ref 0.1–1.0)
Neutro Abs: 7.7 10*3/uL (ref 1.7–7.7)
Neutrophils Relative %: 71 % (ref 43–77)
Platelets: 154 10*3/uL (ref 150–400)
RBC: 4.96 MIL/uL (ref 3.87–5.11)
RDW: 13.1 % (ref 11.5–15.5)
WBC: 10.9 10*3/uL — ABNORMAL HIGH (ref 4.0–10.5)

## 2014-09-11 LAB — COMPREHENSIVE METABOLIC PANEL
ALBUMIN: 2.7 g/dL — AB (ref 3.5–5.2)
ALK PHOS: 96 U/L (ref 39–117)
ALT: 9 U/L (ref 0–35)
AST: 15 U/L (ref 0–37)
Anion gap: 7 (ref 5–15)
BUN: 12 mg/dL (ref 6–23)
CO2: 32 mEq/L (ref 19–32)
Calcium: 9 mg/dL (ref 8.4–10.5)
Chloride: 90 mEq/L — ABNORMAL LOW (ref 96–112)
Creatinine, Ser: 0.37 mg/dL — ABNORMAL LOW (ref 0.50–1.10)
GFR calc non Af Amer: >90 mL/min (ref >90)
GLUCOSE: 94 mg/dL (ref 70–99)
POTASSIUM: 5.2 meq/L (ref 3.7–5.3)
SODIUM: 129 meq/L — AB (ref 137–147)
TOTAL PROTEIN: 5.7 g/dL — AB (ref 6.0–8.3)
Total Bilirubin: 0.4 mg/dL (ref 0.3–1.2)

## 2014-09-11 LAB — URINE CULTURE: Colony Count: 75000

## 2014-09-11 LAB — CLOSTRIDIUM DIFFICILE BY PCR: Toxigenic C. Difficile by PCR: NEGATIVE

## 2014-09-11 LAB — POTASSIUM: Potassium: 4 mEq/L (ref 3.7–5.3)

## 2014-09-11 MED ORDER — SODIUM POLYSTYRENE SULFONATE 15 GM/60ML PO SUSP
45.0000 g | Freq: Once | ORAL | Status: AC
  Administered 2014-09-11: 45 g via ORAL
  Filled 2014-09-11: qty 180

## 2014-09-11 MED ORDER — PRO-STAT SUGAR FREE PO LIQD
30.0000 mL | Freq: Every day | ORAL | Status: DC
  Administered 2014-09-11 – 2014-09-14 (×3): 30 mL via ORAL
  Filled 2014-09-11 (×5): qty 30

## 2014-09-11 MED ORDER — ENSURE COMPLETE PO LIQD
237.0000 mL | Freq: Two times a day (BID) | ORAL | Status: DC
  Administered 2014-09-12 – 2014-09-15 (×7): 237 mL via ORAL

## 2014-09-11 NOTE — Progress Notes (Signed)
[facility] TEAM 1 - Stepdown/ICU TEAM Progress Note  Laurie Horne:[id number] DOB: 1933/10/31 DOA: 09/06/2014 PCP: No primary care provider on file.  Admit HPI / Brief Narrative: 80 year old WF PMHx HTN, everyday smoker, Frequent Falls, Medication misuse, and presumed COPD.  She was admitted to [facility] in 10/2013 after falls with acute respiratory failure. She was intubated in ICU and eventually discharged to SNF. Family reports that she has been anorexic for about the past 5 years and barely eats. She chews her food and spits it out. Watching her weight has been very important to her. Weight in ED is 87lbs. After being discharged from SNF her USOH was significantly diminished when compared to that of before her hospitalization. Husband states that she has not been herself for some time. Over the past several months her abilities have been reduced. She can barely walk anymore according to her husband and has had several falls over the last 7 days. Her husband states that she has struck her head hard several times while falling. He is also concerned that she has been overdosing on her prescribed medications, he is not sure if this is intentional or not. For that reason he has been assisting her with administration. 12/15 he thinks she took her meds again after he gave them to her because he found her unresponsive on the cough in the PM. She was brought to [facility] ED and was noted to be in hypercarbic respiratory failure so she was placed on BiPAP. CT of the head was performed, which showed acute intracranial hemorrhage. Her mental status remains diminished. She was also bradycardic in ED. With concern that she overdosed on her home beta-blocker. She was given glucagon in ED with minimal response.    HPI/Subjective: 12/19  A/O 1 (does not know where, when, why), patient with significant short-term memory loss (i.e. unable to remember the name of nurse or myself 5 minutes after introduction). Request to go home.  Informed by RN Jonny Ruiz that patient was threatening to kill herself several times today. Patient states afraid to leave bed. Also states having watery diarrhea since yesterday.    Assessment/Plan: Suicide attempt -Patient admitted secondary to overdosing on prescribed medication (beta blockers); respiratory failure with hypercarbia admission. has resolved. -12/19 patient continues to mention to RN Jonny Ruiz she wishes to commit suicide -Placed on suicide watch/precautions  Traumatic ICH in setting of frequent falls.  -Per neurosurgery no further diagnostic/therapeutic modalities required. -PT; recommends SNF, OT consult pending -Hyponatremia most likely secondary to ICH (SIADH?)  Acute metabolic encephalopathy  -Most likely multifactorial to include acute ICH vs 2dary ICH, UTI, decompensated anxiety/depression  Acute on chronic hypercarbic respiratory failure 2/2 presumed overdose  -Resolved; patient states does not use home O2  -Hx COPD without evidence of acute exacerbation -PRN BDs  Sinus bradycardia -Resolved   Hypotension  -Resolved  HTN -Continue Amlodipine 5 mg daily -Continue Hydralazine PRN to maintain SBP<170  Hyponatremia (SIADH) -Most likely secondary to ICH, will continue with fluid restriction -Will hold on any additional measures such as salt tablets at this time, if sodium drops under 125 will initiate salt tablets.  Hypochloremia -See hyponatremia  Hyperkalemia -Patient's potassium was trending up toward the upper limits of normal, received Kayexalate 45 g 1  Protein calorie malnutrition -Diet regular with ensure between meals  Nicotine abuse -Continue nicotine patch  Depression/anxiety -Per psychiatry patient is to be admitted to Geropsychiatry at [facility] in [place], when medically cleared -Obtain EKG and if QT  interval normalized will start on antipsychotic medication. -Would hold SSRIs at this time secondary to patient's hyponatremia;  could exacerbate  Lesion right thigh (chronic) -Should be addressed as outpatient to dermatology     Code Status: DNR  Family Communication: no family present at time of exam Disposition Plan: [facility]ry at [facility] in [place], when medically cleared    Consultants: NP Catha NottinghamJamison Lord/Dr. Jama Flavorsobos (psychiatry) Dr. Julio SicksHenry Pool (neurosurgery)  Dr. Max Fickleouglas McQuaid [facility](PCC M)    Procedure/Significant Events: 12/15 CT head without contrast;-Acute hemorrhage in the lateral ventricles without hydrocephalus. -Forehead contusion without fracture. - Chronic white matter disease and mild brain atrophy.    Culture 12/15 negative MRSA by PCR  12/15 blood right wrist/hand NGTS  12/18 Urine culture pending 12/19 C. difficile pending   Antibiotics: Keflex 12/18>> stopped 12/18  Ceftriaxone 12/18>>   DVT prophylaxis: SCD   Devices    LINES / TUBES:      Continuous Infusions:   Objective: VITAL SIGNS: Temp: 97.4 F (36.3 C) (12/19 0800) Temp Source: Axillary (12/19 0800) BP: 112/66 mmHg (12/19 0800) Pulse Rate: 77 (12/19 0800) SPO2; FIO2:   Intake/Output Summary (Last 24 hours) at 09/11/14 1144 Last data filed at 09/11/14 1000  Gross per 24 hour  Intake    740 ml  Output    725 ml  Net     15 ml     Exam: General: A/O x1 (does not know where, when, why), NAD, Anorexic, laceration on her forehead with eschar forming,  No acute respiratory distress Lungs: Clear to auscultation bilaterally without wheezes or crackles Cardiovascular: Regular rate and rhythm without murmur gallop or rub normal S1 and S2 Abdomen: Nontender, nondistended, soft, bowel sounds positive, no rebound, no ascites, no appreciable mass Extremities: No significant cyanosis, clubbing, or edema bilateral lower extremities, right thigh lesion with what appears to be scar (chronic), negative sign of infection  Neurologic; Poor short term memory, CN II-XII intact Tongue/[place] midline, Extremity  strength 5/5, sensation intact, Bilat Finger-nose-Finger some past pointing, unable to perform quick finger touches, sensation intact, did not attempt to ambulate Pt     Data Reviewed: Basic Metabolic Panel:  Recent Labs Lab 09/07/14 0015 09/07/14 0032 09/07/14 0753 09/10/14 1010 09/11/14 0447  NA 134* 129* 131* 129* 129*  K 3.9 3.7 4.3 4.9 5.2  CL 85* 78* 88* 88* 90*  CO2 44*  --  36* 28 32  GLUCOSE 91 88 113* 146* 94  BUN 8 8 8 6 12   CREATININE 0.47* 0.70 0.34* 0.33* 0.37*  CALCIUM 9.6  --  8.9 9.6 9.0  MG  --   --   --  1.9 2.2   Liver Function Tests:  Recent Labs Lab 09/07/14 0015 09/10/14 1010 09/11/14 0447  AST 18 21 15   ALT 9 11 9   ALKPHOS 99 98 96  BILITOT 0.3 0.7 0.4  PROT 6.8 6.6 5.7*  ALBUMIN 3.3* 3.2* 2.7*   No results for input(s): LIPASE, AMYLASE in the last 168 hours. No results for input(s): AMMONIA in the last 168 hours. CBC:  Recent Labs Lab 09/07/14 0015 09/07/14 0032 09/07/14 0753 09/10/14 1010 09/11/14 0447  WBC 11.0*  --  8.4 10.4 10.9*  NEUTROABS 8.3*  --   --  8.2* 7.7  HGB 15.2* 18.0* 14.9 15.6* 14.5  HCT 48.6* 53.0* 48.2* 48.9* 44.1  MCV 94.7  --  94.7 89.4 88.9  PLT 185  --  144* 160 154   Cardiac Enzymes:  Recent Labs Lab 09/07/14  0753  TROPONINI <0.30   BNP (last 3 results)  Recent Labs  11/21/13 1550  PROBNP 16993.0*   CBG:  Recent Labs Lab 09/07/14 0019  GLUCAP 86    Recent Results (from the past 240 hour(s))  MRSA PCR Screening     Status: None   Collection Time: 09/07/14  5:55 AM  Result Value Ref Range Status   MRSA by PCR NEGATIVE NEGATIVE Final    Comment:        The GeneXpert MRSA Assay (FDA approved for NASAL specimens only), is one component of a comprehensive MRSA colonization surveillance program. It is not intended to diagnose MRSA infection nor to guide or monitor treatment for MRSA infections.   Culture, blood (routine x 2)     Status: None (Preliminary result)   Collection Time:  09/07/14  7:40 AM  Result Value Ref Range Status   Specimen Description BLOOD RIGHT WRIST  Final   Special Requests BOTTLES DRAWN AEROBIC ONLY 3CC  Final   Culture  Setup Time   Final    09/07/2014 13:12 Performed at [facility]    Culture   Final           BLOOD CULTURE RECEIVED NO GROWTH TO DATE CULTURE WILL BE HELD FOR 5 DAYS BEFORE ISSUING A FINAL NEGATIVE REPORT Performed at [facility]    Report Status PENDING  Incomplete  Culture, blood (routine x 2)     Status: None (Preliminary result)   Collection Time: 09/07/14  7:54 AM  Result Value Ref Range Status   Specimen Description BLOOD RIGHT HAND  Final   Special Requests BOTTLES DRAWN AEROBIC ONLY 2.5CC  Final   Culture  Setup Time   Final    09/07/2014 13:12 Performed at [facility]    Culture   Final           BLOOD CULTURE RECEIVED NO GROWTH TO DATE CULTURE WILL BE HELD FOR 5 DAYS BEFORE ISSUING A FINAL NEGATIVE REPORT Note: Culture results may be compromised due to an inadequate volume of blood received in culture bottles. Performed at [facility]    Report Status PENDING  Incomplete     Studies:  Recent x-ray studies have been reviewed in detail by the Attending Physician  Scheduled Meds:  Scheduled Meds: . amLODipine  5 mg Oral Daily  . antiseptic oral rinse  7 mL Mouth Rinse q12n4p  . aspirin  81 mg Oral Daily  . brinzolamide  1 drop Both Eyes Daily  . cefTRIAXone (ROCEPHIN)  IV  1 g Intravenous Q24H  . chlorhexidine  15 mL Mouth Rinse BID  . feeding supplement (ENSURE COMPLETE)  237 mL Oral TID PC & HS  . latanoprost  1 drop Both Eyes QHS  . multivitamin with minerals  1 tablet Oral Daily  . nicotine  14 mg Transdermal Q24H  . sodium chloride  3 mL Intravenous Q12H    Time spent on care of this patient: 40 mins   Drema DallasWOODS, CURTIS, J , MD   [facility] Office  [phone] Pager - [phone]  On-Call/Text Page:      [email or web address]      password [email or web address]  If  7PM-7AM, please contact night-coverage [email or web address] Password [email or web address] 09/11/2014, 11:44 AM   LOS: 5 days

## 2014-09-11 NOTE — Progress Notes (Signed)
NUTRITION FOLLOW-UP  DOCUMENTATION CODES Per approved criteria  -Severe malnutrition in the context of chronic illness -Underweight   INTERVENTION: Decrease Ensure Complete po to BID, each supplement provides 350 kcal and 13 grams of protein  Provide 30 ml Prostat po once daily, each supplement provides 100 kcal and 15 grams of protein.  Encourage adequate PO intake.  NUTRITION DIAGNOSIS: Malnurition related to chronic illness as evidenced by severe fat and muscle depletion; ongoing.   Goal: Pt to meet >/= 90% of their estimated nutrition needs; not met.    Monitor:  PO intake, supplement acceptance, weight trends, labs  ASSESSMENT: Pt admitted from home where she lives with her husband after being found unresponsive. Husband concerned that pt took a double dose of her medications. Per family she has been depressed lately.    Pt has been recently put on a 1000 ml fluid restriction. Per MD note, hyponatremia is likely secondary to CVA causing SIADH thus will restrict fluids. Consulted by RN that Ensure QID may be too much fluids. RD to decrease Ensure and provide Prostat to help with calorie and protein needs. Meal completion is 75%.   Height: Ht Readings from Last 1 Encounters:  09/07/14 _0  (1.651 m)    Weight: Wt Readings from Last 1 Encounters:  09/11/14 92 lb 2.4 oz (41.8 kg)    BMI:  Body mass index is 15.33 kg/(m^2).  Re-Estimated Nutritional Needs: Kcal: 1300-1500 Protein: 60-75 grams Fluid: > 1.5 L/day  Skin: multiple scabbed areas to all extremities   Diet Order: Diet regular   Intake/Output Summary (Last 24 hours) at 09/11/14 1206 Last data filed at 09/11/14 1000  Gross per 24 hour  Intake    740 ml  Output    600 ml  Net    140 ml    Last BM: 12/19  Labs:   Recent Labs Lab 09/07/14 0753 09/10/14 1010 09/11/14 0447  NA 131* 129* 129*  K 4.3 4.9 5.2  CL 88* 88* 90*  CO2 36* 28 32  BUN _1 CREATININE 0.34* 0.33* 0.37*  CALCIUM  8.9 9.6 9.0  MG  --  1.9 2.2  GLUCOSE 113* 146* 94    CBG (last 3)  No results for input(s): GLUCAP in the last 72 hours.  Scheduled Meds: . amLODipine  5 mg Oral Daily  . antiseptic oral rinse  7 mL Mouth Rinse q12n4p  . aspirin  81 mg Oral Daily  . brinzolamide  1 drop Both Eyes Daily  . cefTRIAXone (ROCEPHIN)  IV  1 g Intravenous Q24H  . chlorhexidine  15 mL Mouth Rinse BID  . feeding supplement (ENSURE COMPLETE)  237 mL Oral TID PC & HS  . latanoprost  1 drop Both Eyes QHS  . multivitamin with minerals  1 tablet Oral Daily  . nicotine  14 mg Transdermal Q24H  . sodium chloride  3 mL Intravenous Q12H    Continuous Infusions:    Kallie Locks, MS, RD, LDN Pager # [phone] After hours/ weekend pager # [phone]

## 2014-09-12 DIAGNOSIS — S06340S Traumatic hemorrhage of right cerebrum without loss of consciousness, sequela: Secondary | ICD-10-CM

## 2014-09-12 DIAGNOSIS — R001 Bradycardia, unspecified: Secondary | ICD-10-CM | POA: Insufficient documentation

## 2014-09-12 DIAGNOSIS — R197 Diarrhea, unspecified: Secondary | ICD-10-CM

## 2014-09-12 LAB — CBC WITH DIFFERENTIAL/PLATELET
BASOS ABS: 0.1 10*3/uL (ref 0.0–0.1)
BASOS PCT: 1 % (ref 0–1)
EOS PCT: 1 % (ref 0–5)
Eosinophils Absolute: 0.1 10*3/uL (ref 0.0–0.7)
HCT: 46.9 % — ABNORMAL HIGH (ref 36.0–46.0)
Hemoglobin: 14.6 g/dL (ref 12.0–15.0)
Lymphocytes Relative: 18 % (ref 12–46)
Lymphs Abs: 1.6 10*3/uL (ref 0.7–4.0)
MCH: 29.4 pg (ref 26.0–34.0)
MCHC: 31.1 g/dL (ref 30.0–36.0)
MCV: 94.4 fL (ref 78.0–100.0)
MONO ABS: 0.9 10*3/uL (ref 0.1–1.0)
Monocytes Relative: 11 % (ref 3–12)
NEUTROS ABS: 6.2 10*3/uL (ref 1.7–7.7)
Neutrophils Relative %: 69 % (ref 43–77)
Platelets: 161 10*3/uL (ref 150–400)
RBC: 4.97 MIL/uL (ref 3.87–5.11)
RDW: 13.4 % (ref 11.5–15.5)
WBC: 8.9 10*3/uL (ref 4.0–10.5)

## 2014-09-12 LAB — COMPREHENSIVE METABOLIC PANEL
ALK PHOS: 89 U/L (ref 39–117)
ALT: 12 U/L (ref 0–35)
ANION GAP: 10 (ref 5–15)
AST: 18 U/L (ref 0–37)
Albumin: 2.9 g/dL — ABNORMAL LOW (ref 3.5–5.2)
BILIRUBIN TOTAL: 0.2 mg/dL — AB (ref 0.3–1.2)
BUN: 14 mg/dL (ref 6–23)
CHLORIDE: 91 meq/L — AB (ref 96–112)
CO2: 34 mEq/L — ABNORMAL HIGH (ref 19–32)
CREATININE: 0.37 mg/dL — AB (ref 0.50–1.10)
Calcium: 9.1 mg/dL (ref 8.4–10.5)
GFR calc Af Amer: >90 mL/min (ref >90)
GFR calc non Af Amer: >90 mL/min (ref >90)
Glucose, Bld: 97 mg/dL (ref 70–99)
Potassium: 4.2 mEq/L (ref 3.7–5.3)
Sodium: 135 mEq/L — ABNORMAL LOW (ref 137–147)
Total Protein: 6 g/dL (ref 6.0–8.3)

## 2014-09-12 LAB — MAGNESIUM: MAGNESIUM: 2.2 mg/dL (ref 1.5–2.5)

## 2014-09-12 MED ORDER — LOPERAMIDE HCL 2 MG PO CAPS
2.0000 mg | ORAL_CAPSULE | ORAL | Status: DC | PRN
  Filled 2014-09-12 (×2): qty 1

## 2014-09-12 MED ORDER — LOPERAMIDE HCL 2 MG PO CAPS
4.0000 mg | ORAL_CAPSULE | Freq: Once | ORAL | Status: DC
  Filled 2014-09-12: qty 2

## 2014-09-12 MED ORDER — LOPERAMIDE HCL 2 MG PO CAPS
4.0000 mg | ORAL_CAPSULE | Freq: Once | ORAL | Status: AC
  Administered 2014-09-12: 4 mg via ORAL
  Filled 2014-09-12: qty 2

## 2014-09-12 MED ORDER — CHLORDIAZEPOXIDE HCL 5 MG PO CAPS
5.0000 mg | ORAL_CAPSULE | Freq: Every day | ORAL | Status: DC
Start: 2014-09-12 — End: 2014-09-23
  Administered 2014-09-12 – 2014-09-23 (×10): 5 mg via ORAL
  Filled 2014-09-12 (×10): qty 1

## 2014-09-12 NOTE — Progress Notes (Signed)
CSW faxed referral and labs to [facility] for possible transfer as soon as tomorrow.  CSW will follow up with referral.  Seward SpeckLeo Mcdonald Reiling Macy MisLCSW,LCAS [place] [facility] CSW [phone]

## 2014-09-12 NOTE — Progress Notes (Signed)
Ordered suicide precautions discontinued per MD order. Patient no longer expressing feelings of self harm. Will continue to monitor patient.

## 2014-09-12 NOTE — Progress Notes (Signed)
[facility] TEAM 1 - Stepdown/ICU TEAM Progress Note  Laurie Horne ZOX:[id number] DOB: Apr 22, 1934 DOA: 09/06/2014 PCP: No primary care provider on file.  Admit HPI / Brief Narrative: 80 year old WF PMHx HTN, everyday smoker, Frequent Falls, Medication misuse, and presumed COPD.  She was admitted to [facility] in 10/2013 after falls with acute respiratory failure. She was intubated in ICU and eventually discharged to SNF. Family reports that she has been anorexic for about the past 5 years and barely eats. She chews her food and spits it out. Watching her weight has been very important to her. Weight in ED is 87lbs. After being discharged from SNF her USOH was significantly diminished when compared to that of before her hospitalization. Husband states that she has not been herself for some time. Over the past several months her abilities have been reduced. She can barely walk anymore according to her husband and has had several falls over the last 7 days. Her husband states that she has struck her head hard several times while falling. He is also concerned that she has been overdosing on her prescribed medications, he is not sure if this is intentional or not. For that reason he has been assisting her with administration. 12/15 he thinks she took her meds again after he gave them to her because he found her unresponsive on the cough in the PM. She was brought to [facility] ED and was noted to be in hypercarbic respiratory failure so she was placed on BiPAP. CT of the head was performed, which showed acute intracranial hemorrhage. Her mental status remains diminished. She was also bradycardic in ED. With concern that she overdosed on her home beta-blocker. She was given glucagon in ED with minimal response.    HPI/Subjective: 12/20  A/O 1 (does not know where, when, why), patient with significant short-term memory loss (i.e. unable to remember the name of nurse or myself 5 minutes after introduction). Request to go home.  Informed by RN Jonny Ruiz that patient was threatening to kill herself several times yesterday. Patient up in chair feels ready to ambulate with assistance.    Assessment/Plan: Suicide attempt -Patient admitted secondary to overdosing on prescribed medication (beta blockers); respiratory failure with hypercarbia admission. has resolved. -12/19 patient continues to mention to RN Jonny Ruiz she wishes to commit suicide -Continue suicide watch/precautions -Begin discharge proceedings on 12/21 to Geropsychiatry at [facility] in [place]; spoke with CSW Crystal will fax paperwork today. -RN Jonny Ruiz feels that yesterday's patient's suicide threats were just a call for attention; patient has not made any additional statements and would be safe to come off suicide watch. Will DC suicide precautions.   Traumatic ICH in setting of frequent falls.  -Per neurosurgery no further diagnostic/therapeutic modalities required. -PT; recommends SNF, OT consult pending -Hyponatremia most likely secondary to ICH (SIADH?); Resolving with fluid restriction  Acute metabolic encephalopathy  -Most likely multifactorial to include acute ICH vs 2dary ICH, UTI, decompensated anxiety/depression -Begin discharge proceedings on 12/21 to Geropsychiatry at [facility] in [place]  Acute on chronic hypercarbic respiratory failure 2/2 presumed overdose  -Resolved; patient states does not use home O2  -Hx COPD without evidence of acute exacerbation -PRN BDs  Sinus bradycardia -Resolved   Hypotension  -Resolved  HTN -Continue Amlodipine 5 mg daily -Continue Hydralazine PRN to maintain SBP<170  Hyponatremia (SIADH) -Most likely secondary to ICH, will continue with fluid restriction -Will hold on any additional measures such as salt tablets at this time, if sodium drops under 125  will initiate salt tablets. -Resolving with fluid restriction.  Hypochloremia -See hyponatremia  Hyperkalemia -12/20 resolving  Protein  calorie malnutrition -Diet regular with ensure between meals  Nicotine abuse -Continue nicotine patch  Depression/anxiety -Per psychiatry patient is to be admitted to [facility] [place], when medically cleared -12/19 EKG normal will start patient on long-acting benzodiazepine (chlordiazepoxide 5 mg daily and titrate up as needed), -If long-acting benzodiazepine not efficacious would start on Haldol antipsychotic medication. -Continue to hold SSRIs at this time secondary to patient's hyponatremia; could exacerbate  Lesion right thigh (chronic) -Patient has 2 lesions on right leg 1 on the anterior portion of thigh, and the second on the lateral aspect of calf. The lesion on the thigh appears to have enlarged overnight and is fluctuant.  -Counseled patient and husband would I&D the larger thigh lesion today or tomorrow.   Diarrhea -Loperamide    Code Status: DNR  Family Communication: no family present at time of exam Disposition Plan: [facility] [place], when medically cleared    Consultants: NP Catha NottinghamJamison Lord/Dr. Jama Flavorsobos (psychiatry) Dr. Julio SicksHenry Pool (neurosurgery)  Dr. Max Fickleouglas McQuaid [facility]    Procedure/Significant Events: 12/15 CT head without contrast;-Acute hemorrhage in the lateral ventricles without hydrocephalus. -Forehead contusion without fracture. - Chronic white matter disease and mild brain atrophy.    Culture 12/15 negative MRSA by PCR  12/15 blood right wrist/hand NGTS  12/18 Urine culture positive multiple bacterial morphotypes.  12/19 C. difficile negative   Antibiotics: Keflex 12/18>> stopped 12/18  Ceftriaxone 12/18>>   DVT prophylaxis: SCD   Devices    LINES / TUBES:      Continuous Infusions:   Objective: VITAL SIGNS: Temp: 98.4 F (36.9 C) (12/20 0800) Temp Source: Oral (12/20 0800) BP: 158/90 mmHg (12/20 1000) Pulse Rate: 80 (12/20 1000) SPO2; FIO2:   Intake/Output Summary  (Last 24 hours) at 09/12/14 1046 Last data filed at 09/12/14 1037  Gross per 24 hour  Intake    773 ml  Output    850 ml  Net    -77 ml     Exam: General: A/O x1 (does not know where, when, why), NAD, Anorexic, laceration on her forehead with eschar forming,  No acute respiratory distress Lungs: Clear to auscultation bilaterally without wheezes or crackles Cardiovascular: Regular rate and rhythm without murmur gallop or rub normal S1 and S2 Abdomen: Nontender, nondistended, soft, bowel sounds positive, no rebound, no ascites, no appreciable mass Extremities: No significant cyanosis, clubbing, or edema bilateral lower extremities, right thigh lesion with what appears to be scar (chronic), negative sign of infection  Neurologic; Poor short term memory, CN II-XII intact Tongue/[place] midline, Extremity strength 5/5, sensation intact, Bilat Finger-nose-Finger some past pointing, unable to perform quick finger touches, sensation intact, did not attempt to ambulate Pt     Data Reviewed: Basic Metabolic Panel:  Recent Labs Lab 09/07/14 0015 09/07/14 0032 09/07/14 0753 09/10/14 1010 09/11/14 0447 09/11/14 1345 09/12/14 0318  NA 134* 129* 131* 129* 129*  --  135*  K 3.9 3.7 4.3 4.9 5.2 4.0 4.2  CL 85* 78* 88* 88* 90*  --  91*  CO2 44*  --  36* 28 32  --  34*  GLUCOSE 91 88 113* 146* 94  --  97  BUN 8 8 8 6 12   --  14  CREATININE 0.47* 0.70 0.34* 0.33* 0.37*  --  0.37*  CALCIUM 9.6  --  8.9 9.6 9.0  --  9.1  MG  --   --   --  1.9 2.2  --  2.2   Liver Function Tests:  Recent Labs Lab 09/07/14 0015 09/10/14 1010 09/11/14 0447 09/12/14 0318  AST 18 21 15 18   ALT 9 11 9 12   ALKPHOS 99 98 96 89  BILITOT 0.3 0.7 0.4 0.2*  PROT 6.8 6.6 5.7* 6.0  ALBUMIN 3.3* 3.2* 2.7* 2.9*   No results for input(s): LIPASE, AMYLASE in the last 168 hours. No results for input(s): AMMONIA in the last 168 hours. CBC:  Recent Labs Lab 09/07/14 0015 09/07/14 0032 09/07/14 0753 09/10/14 1010  09/11/14 0447 09/12/14 0318  WBC 11.0*  --  8.4 10.4 10.9* 8.9  NEUTROABS 8.3*  --   --  8.2* 7.7 6.2  HGB 15.2* 18.0* 14.9 15.6* 14.5 14.6  HCT 48.6* 53.0* 48.2* 48.9* 44.1 46.9*  MCV 94.7  --  94.7 89.4 88.9 94.4  PLT 185  --  144* 160 154 161   Cardiac Enzymes:  Recent Labs Lab 09/07/14 0753  TROPONINI <0.30   BNP (last 3 results)  Recent Labs  11/21/13 1550  PROBNP 16993.0*   CBG:  Recent Labs Lab 09/07/14 0019  GLUCAP 86    Recent Results (from the past 240 hour(s))  MRSA PCR Screening     Status: None   Collection Time: 09/07/14  5:55 AM  Result Value Ref Range Status   MRSA by PCR NEGATIVE NEGATIVE Final    Comment:        The GeneXpert MRSA Assay (FDA approved for NASAL specimens only), is one component of a comprehensive MRSA colonization surveillance program. It is not intended to diagnose MRSA infection nor to guide or monitor treatment for MRSA infections.   Culture, blood (routine x 2)     Status: None (Preliminary result)   Collection Time: 09/07/14  7:40 AM  Result Value Ref Range Status   Specimen Description BLOOD RIGHT WRIST  Final   Special Requests BOTTLES DRAWN AEROBIC ONLY 3CC  Final   Culture  Setup Time   Final    09/07/2014 13:12 Performed at [facility]    Culture   Final           BLOOD CULTURE RECEIVED NO GROWTH TO DATE CULTURE WILL BE HELD FOR 5 DAYS BEFORE ISSUING A FINAL NEGATIVE REPORT Performed at [facility]    Report Status PENDING  Incomplete  Culture, blood (routine x 2)     Status: None (Preliminary result)   Collection Time: 09/07/14  7:54 AM  Result Value Ref Range Status   Specimen Description BLOOD RIGHT HAND  Final   Special Requests BOTTLES DRAWN AEROBIC ONLY 2.5CC  Final   Culture  Setup Time   Final    09/07/2014 13:12 Performed at [facility]    Culture   Final           BLOOD CULTURE RECEIVED NO GROWTH TO DATE CULTURE WILL BE HELD FOR 5 DAYS BEFORE ISSUING A FINAL  NEGATIVE REPORT Note: Culture results may be compromised due to an inadequate volume of blood received in culture bottles. Performed at [facility]    Report Status PENDING  Incomplete  Culture, Urine     Status: None   Collection Time: 09/10/14 11:45 AM  Result Value Ref Range Status   Specimen Description URINE, RANDOM  Final   Special Requests NONE  Final   Culture  Setup Time   Final  09/10/2014 21:24 Performed at [facility]   Final    75,000 COLONIES/ML Performed at [facility]   Final    Multiple bacterial morphotypes present, none predominant. Suggest appropriate recollection if clinically indicated. Performed at [facility]    Report Status 09/11/2014 FINAL  Final  Clostridium Difficile by PCR     Status: None   Collection Time: 09/11/14  2:00 PM  Result Value Ref Range Status   C difficile by pcr NEGATIVE NEGATIVE Final     Studies:  Recent x-ray studies have been reviewed in detail by the Attending Physician  Scheduled Meds:  Scheduled Meds: . amLODipine  5 mg Oral Daily  . antiseptic oral rinse  7 mL Mouth Rinse q12n4p  . aspirin  81 mg Oral Daily  . brinzolamide  1 drop Both Eyes Daily  . cefTRIAXone (ROCEPHIN)  IV  1 g Intravenous Q24H  . chlorhexidine  15 mL Mouth Rinse BID  . feeding supplement (ENSURE COMPLETE)  237 mL Oral BID BM  . feeding supplement (PRO-STAT SUGAR FREE 64)  30 mL Oral Q1500  . latanoprost  1 drop Both Eyes QHS  . multivitamin with minerals  1 tablet Oral Daily  . nicotine  14 mg Transdermal Q24H  . sodium chloride  3 mL Intravenous Q12H    Time spent on care of this patient: 40 mins   Drema Dallas , MD   [facility] Office  [phone] Pager - [phone]  On-Call/Text Page:      [email or web address]      password [email or web address]  If 7PM-7AM, please contact night-coverage [email or web address] Password [email or web address] 09/12/2014, 10:46 AM   LOS: 6 days

## 2014-09-12 NOTE — Progress Notes (Signed)
During assessment, Patient stated, " I just want to kill myself." Patient was explained the seriousness of her threat. Patient acknowledged concern, but again made comments concerning self harm. Patient was then placed on suicide precautions. Patient has a Recruitment consultantsafety sitter, which was changed to a suicide sitter case, staffing and the [facility] was informed. All potential items of self harm were removed and sitter was informed of change in sitter case. Primary care physician was contacted concerning the change in status. Will continue to monitor closely.

## 2014-09-13 LAB — COMPREHENSIVE METABOLIC PANEL
ALT: 12 U/L (ref 0–35)
ANION GAP: 6 (ref 5–15)
AST: 18 U/L (ref 0–37)
Albumin: 2.9 g/dL — ABNORMAL LOW (ref 3.5–5.2)
Alkaline Phosphatase: 89 U/L (ref 39–117)
BILIRUBIN TOTAL: 0.3 mg/dL (ref 0.3–1.2)
BUN: 10 mg/dL (ref 6–23)
CALCIUM: 9.4 mg/dL (ref 8.4–10.5)
CHLORIDE: 92 meq/L — AB (ref 96–112)
CO2: 37 mEq/L — ABNORMAL HIGH (ref 19–32)
CREATININE: 0.35 mg/dL — AB (ref 0.50–1.10)
GFR calc Af Amer: >90 mL/min (ref >90)
GFR calc non Af Amer: >90 mL/min (ref >90)
Glucose, Bld: 94 mg/dL (ref 70–99)
Potassium: 4.4 mEq/L (ref 3.7–5.3)
Sodium: 135 mEq/L — ABNORMAL LOW (ref 137–147)
TOTAL PROTEIN: 5.8 g/dL — AB (ref 6.0–8.3)

## 2014-09-13 LAB — CBC WITH DIFFERENTIAL/PLATELET
BASOS ABS: 0 10*3/uL (ref 0.0–0.1)
Basophils Relative: 0 % (ref 0–1)
EOS PCT: 1 % (ref 0–5)
Eosinophils Absolute: 0.1 10*3/uL (ref 0.0–0.7)
HCT: 45.1 % (ref 36.0–46.0)
HEMOGLOBIN: 13.7 g/dL (ref 12.0–15.0)
LYMPHS ABS: 1.4 10*3/uL (ref 0.7–4.0)
Lymphocytes Relative: 17 % (ref 12–46)
MCH: 28.2 pg (ref 26.0–34.0)
MCHC: 30.4 g/dL (ref 30.0–36.0)
MCV: 92.8 fL (ref 78.0–100.0)
MONO ABS: 0.8 10*3/uL (ref 0.1–1.0)
MONOS PCT: 10 % (ref 3–12)
NEUTROS ABS: 5.8 10*3/uL (ref 1.7–7.7)
Neutrophils Relative %: 72 % (ref 43–77)
Platelets: 169 10*3/uL (ref 150–400)
RBC: 4.86 MIL/uL (ref 3.87–5.11)
RDW: 13.3 % (ref 11.5–15.5)
WBC: 8.1 10*3/uL (ref 4.0–10.5)

## 2014-09-13 LAB — CULTURE, BLOOD (ROUTINE X 2)
Culture: NO GROWTH
Culture: NO GROWTH

## 2014-09-13 LAB — MAGNESIUM: Magnesium: 2.1 mg/dL (ref 1.5–2.5)

## 2014-09-13 NOTE — Progress Notes (Signed)
[facility] PROGRESS NOTE  Laurie ChengJoan C Lauture BJY:[id number] DOB: 01/01/1934 DOA: 09/06/2014 PCP: No primary care provider on file.  Assessment/Plan: Traumatic ICH in setting of frequent falls.  -Per neurosurgery no further diagnostic/therapeutic modalities required. -PT; recommends SNF, OT recs for SNF as well  Acute metabolic encephalopathy  -Most likely multifactorial to include acute ICH vs 2nd to ICH, UTI  Acute on chronic hypercarbic respiratory failure 2/2 presumed overdose  -Resolved; patient states does not use home O2  -Hx COPD without evidence of acute exacerbation -PRN BDs  Sinus bradycardia -Most likely secondary to beta blocker overdose; resolved  Hypotension  -Resolved -Patient now becoming hypertensive   HTN -Stable, albeit not ideally controlled -For now continue with amlodipine 5 mg daily -Hydralazine PRN to maintain SBP<170  Hyponatremia -Follow lytes  Hypochloremia -Improving -Continue to follow lytes  Protein calorie malnutrition -Diet regular with ensure between meals  Nicotine abuse -Continue nicotine patch  Depression -Per psychiatry patient is to be admitted to [facility] [place], when medically cleared -Pt seen for the first time by myself today -Pt initially claims to not know why she was in the hospital and requested to go home. Pt reminded she had threatened to kill herself. Previous documentation was read to her. Pt claims she was not aware of stating these things and that "I'm just like any redhead who says crazy things." -Case discussed with Psychiatry who still believes a transfer [facility] would be ideal in this situation. SW on board. Will follow   Code Status: Full Family Communication: Pt in room, husband at bedside Disposition Plan: Pending tx [facility]   Consultants:  Psychiatry  Critical Care  Procedures:    Antibiotics:    HPI/Subjective: No  acute events overnight. Pt wants to go home  Objective: Filed Vitals:   09/12/14 2056 09/13/14 0114 09/13/14 0806 09/13/14 0941  BP: 155/76 154/78  154/75  Pulse: 74 82  82  Temp: 98.2 F (36.8 C) 98.1 F (36.7 C)  98.3 F (36.8 C)  TempSrc: Oral Oral  Oral  Resp: 20 20  18   Height:      Weight:   43.001 kg (94 lb 12.8 oz)   SpO2: 99% 100%  100%   No intake or output data in the 24 hours ending 09/13/14 1444 Filed Weights   09/11/14 0600 09/12/14 0400 09/13/14 0806  Weight: 41.8 kg (92 lb 2.4 oz) 41.9 kg (92 lb 6 oz) 43.001 kg (94 lb 12.8 oz)    Exam:   General:  Awake, in nad  Cardiovascular: regular, s1, s2  Respiratory: normal resp effort, no wheezing  Abdomen: soft,nondistended  Musculoskeletal: perfused, no clubbing   Data Reviewed: Basic Metabolic Panel:  Recent Labs Lab 09/07/14 0753 09/10/14 1010 09/11/14 0447 09/11/14 1345 09/12/14 0318 09/13/14 0545  NA 131* 129* 129*  --  135* 135*  K 4.3 4.9 5.2 4.0 4.2 4.4  CL 88* 88* 90*  --  91* 92*  CO2 36* 28 32  --  34* 37*  GLUCOSE 113* 146* 94  --  97 94  BUN 8 6 12   --  14 10  CREATININE 0.34* 0.33* 0.37*  --  0.37* 0.35*  CALCIUM 8.9 9.6 9.0  --  9.1 9.4  MG  --  1.9 2.2  --  2.2 2.1   Liver Function Tests:  Recent Labs Lab 09/07/14 0015 09/10/14 1010 09/11/14 0447 09/12/14 0318 09/13/14 0545  AST 18 21 15 18  18  ALT 9 11 9 12 12   ALKPHOS 99 98 96 89 89  BILITOT 0.3 0.7 0.4 0.2* 0.3  PROT 6.8 6.6 5.7* 6.0 5.8*  ALBUMIN 3.3* 3.2* 2.7* 2.9* 2.9*   No results for input(s): LIPASE, AMYLASE in the last 168 hours. No results for input(s): AMMONIA in the last 168 hours. CBC:  Recent Labs Lab 09/07/14 0015  09/07/14 0753 09/10/14 1010 09/11/14 0447 09/12/14 0318 09/13/14 0545  WBC 11.0*  --  8.4 10.4 10.9* 8.9 8.1  NEUTROABS 8.3*  --   --  8.2* 7.7 6.2 5.8  HGB 15.2*  < > 14.9 15.6* 14.5 14.6 13.7  HCT 48.6*  < > 48.2* 48.9* 44.1 46.9* 45.1  MCV 94.7  --  94.7 89.4 88.9 94.4 92.8   PLT 185  --  144* 160 154 161 169  < > = values in this interval not displayed. Cardiac Enzymes:  Recent Labs Lab 09/07/14 0753  TROPONINI <0.30   BNP (last 3 results)  Recent Labs  11/21/13 1550  PROBNP 16993.0*   CBG:  Recent Labs Lab 09/07/14 0019  GLUCAP 86    Recent Results (from the past 240 hour(s))  MRSA PCR Screening     Status: None   Collection Time: 09/07/14  5:55 AM  Result Value Ref Range Status   MRSA by PCR NEGATIVE NEGATIVE Final    Comment:        The GeneXpert MRSA Assay (FDA approved for NASAL specimens only), is one component of a comprehensive MRSA colonization surveillance program. It is not intended to diagnose MRSA infection nor to guide or monitor treatment for MRSA infections.   Culture, blood (routine x 2)     Status: None   Collection Time: 09/07/14  7:40 AM  Result Value Ref Range Status   Specimen Description BLOOD RIGHT WRIST  Final   Special Requests BOTTLES DRAWN AEROBIC ONLY 3CC  Final   Culture  Setup Time   Final    09/07/2014 13:12 Performed at [facility]    Culture   Final    NO GROWTH 5 DAYS Performed at [facility]    Report Status 09/13/2014 FINAL  Final  Culture, blood (routine x 2)     Status: None   Collection Time: 09/07/14  7:54 AM  Result Value Ref Range Status   Specimen Description BLOOD RIGHT HAND  Final   Special Requests BOTTLES DRAWN AEROBIC ONLY 2.5CC  Final   Culture  Setup Time   Final    09/07/2014 13:12 Performed at [facility]    Culture   Final    NO GROWTH 5 DAYS Note: Culture results may be compromised due to an inadequate volume of blood received in culture bottles. Performed at [facility]    Report Status 09/13/2014 FINAL  Final  Culture, Urine     Status: None   Collection Time: 09/10/14 11:45 AM  Result Value Ref Range Status   Specimen Description URINE, RANDOM  Final   Special Requests NONE  Final   Culture  Setup Time   Final     09/10/2014 21:24 Performed at [facility]   Final    75,000 COLONIES/ML Performed at [facility]    Culture   Final    Multiple bacterial morphotypes present, none predominant. Suggest appropriate recollection if clinically indicated. Performed at [facility]    Report Status 09/11/2014 FINAL  Final  Clostridium Difficile  by PCR     Status: None   Collection Time: 09/11/14  2:00 PM  Result Value Ref Range Status   C difficile by pcr NEGATIVE NEGATIVE Final     Studies: No results found.  Scheduled Meds: . amLODipine  5 mg Oral Daily  . antiseptic oral rinse  7 mL Mouth Rinse q12n4p  . aspirin  81 mg Oral Daily  . brinzolamide  1 drop Both Eyes Daily  . cefTRIAXone (ROCEPHIN)  IV  1 g Intravenous Q24H  . chlordiazePOXIDE  5 mg Oral Daily  . chlorhexidine  15 mL Mouth Rinse BID  . feeding supplement (ENSURE COMPLETE)  237 mL Oral BID BM  . feeding supplement (PRO-STAT SUGAR FREE 64)  30 mL Oral Q1500  . latanoprost  1 drop Both Eyes QHS  . multivitamin with minerals  1 tablet Oral Daily  . nicotine  14 mg Transdermal Q24H  . sodium chloride  3 mL Intravenous Q12H   Continuous Infusions:   Active Problems:   ICH (intracerebral hemorrhage)   Anxiety   Depression   Acute on chronic respiratory failure with hypercapnia   Adverse reaction to beta-blocker   Altered awareness, transient   Sinus bradycardia   Other specified hypotension   Essential hypertension   Hyponatremia   Protein-calorie malnutrition   Nicotine abuse   Intracranial bleeding   Suicide attempt   Traumatic intracranial hemorrhage   Metabolic encephalopathy   Hyperkalemia   Anxiety state   Skin lesion of right lower extremity   Bradycardia, sinus   Diarrhea  Time spent: 30min  Zian Delair K  [facility] Pager [phone]. If 7PM-7AM, please contact night-coverage at [email or web address], password [email or web address] 09/13/2014, 2:44 PM  LOS: 7 days

## 2014-09-13 NOTE — Clinical Social Work Note (Signed)
CSW reviewed chart. Pt transferred to 4N on 09/12/2014. Per chart psychiatry was consulted due to the pt's SI attempt/threat. CSW left a voice message for the psychiatric social worker regarding the [facility]. CSW is awaiting a call back from the psychiatric social worker.   Sereena Marando, MSW, LCSWA [phone]

## 2014-09-13 NOTE — Progress Notes (Signed)
Pt. Was taken off of 2L oxygen to see how she did. The tech reported that her O2 sat was 92%. I re-initiated her back on 2L of oxygen and patient is doing well. Will continue to monitor. Melina Schoolshompson, Sharri Loya E, RN  09/13/2014 9:44 PM

## 2014-09-13 NOTE — Evaluation (Signed)
Occupational Therapy Evaluation Patient Details Name: Laurie Horne MRN: [id number] DOB: 09/30/1933 Today's Date: 09/13/2014    History of Present Illness 80 yo female s/p fall with IVH and respiratory failure. pt reports wishes to committ suicide. UTI (+) NWG:NFAOZHYPMH:anxiety, HTN,depression, anorexia, home oxygen     Clinical Impression   PT admitted with s/p fall with IVH. Pt currently with functional limitiations due to the deficits listed below (see OT problem list). PtA living at home with spouse with recent start to home oxygen and multiple falls.  Pt will benefit from skilled OT to increase their independence and safety with adls and balance to allow discharge SNF. Ot to follow acutely for dynamic balance with adls.     Follow Up Recommendations  SNF;Supervision/Assistance - 24 hour    Equipment Recommendations  Other (comment) (defer)    Recommendations for Other Services       Precautions / Restrictions Precautions Precautions: Fall      Mobility Bed Mobility Overal bed mobility: Modified Independent Bed Mobility: Supine to Sit;Sit to Supine     Supine to sit: HOB elevated Sit to supine: HOB elevated   General bed mobility comments: Pt with hob ~45 degrees and able to scoot to the eob without (A) only cues. Spouse present and reports "now she hasnt been on her feet" very anxious about therapist moving patient.   Transfers Overall transfer level: Needs assistance Equipment used: Rolling walker (2 wheeled) Transfers: Sit to/from Stand Sit to Stand: Min guard         General transfer comment: pt completed eob sitting with initial dizziness that quickly resolved with ankle pumps. Pt completed sit<>stand and reports some tightness in calf with marching. Pt return to sitting to allow therapist to place O2 tank in proper safe position for transfer. Pt ambulated to 3n1 at sink, sink to commode, commode to sink , sink to bed level.    Balance Overall balance assessment:  Needs assistance Sitting-balance support: No upper extremity supported;Feet supported Sitting balance-Leahy Scale: Good     Standing balance support: No upper extremity supported;During functional activity Standing balance-Leahy Scale: Poor                              ADL Overall ADL's : Needs assistance/impaired Eating/Feeding: Set up;Bed level Eating/Feeding Details (indicate cue type and reason): pt and spouse express dislike for food options this morning for breakfast. pt provided sauage toast jam warm tea and yogurt. Pt expressed extreme disappointment. Pt requestign orange juice Grooming: Oral care;Wash/dry face;Minimal assistance;Sitting Grooming Details (indicate cue type and reason): pt required cues to complete task and continue task                  Toilet Transfer: Minimal assistance;Ambulation;Regular Toilet;RW Toilet Transfer Details (indicate cue type and reason): pt needed cues to back up with RW Toileting- Clothing Manipulation and Hygiene: Min guard;Sitting/lateral lean Toileting - Clothing Manipulation Details (indicate cue type and reason): cues to complete task. Pt with rectal pouch and no recall of it until wiping     Functional mobility during ADLs: Minimal assistance;Rolling walker General ADL Comments: Pt picking up RW at times during ambulation and needed cues to keep rw on the ground. pt unable to recall reason for having oxygen or the need to wear it. "Laurie Ruizjohn can i take this off? why do I have it?" pt makes a negative self comment at the mirror in bathroom about wanting  to "off myself" and then states "I can't say that they will put me in psych care for that but I just want to pick at my face and they will put me somewhere for that too"  Pt very focused on facial injuries during washing face and states "i am just so ugly" Pt demonstrates safety error and lack of awareness to deficits but reports "i have fallen alot"     Vision                      Perception     Praxis      Pertinent Vitals/Pain Pain Assessment: No/denies pain     Hand Dominance Left   Extremity/Trunk Assessment Upper Extremity Assessment Upper Extremity Assessment: Overall WFL for tasks assessed   Lower Extremity Assessment Lower Extremity Assessment: Defer to PT evaluation   Cervical / Trunk Assessment Cervical / Trunk Assessment: Normal   Communication Communication Communication: No difficulties   Cognition Arousal/Alertness: Awake/alert Behavior During Therapy: WFL for tasks assessed/performed Overall Cognitive Status: Impaired/Different from baseline Area of Impairment: Orientation;Attention;Memory;Following commands;Safety/judgement;Awareness;Problem solving Orientation Level: Disoriented to;Situation Current Attention Level: Sustained Memory: Decreased recall of precautions;Decreased short-term memory Following Commands: Follows one step commands with increased time Safety/Judgement: Decreased awareness of safety;Decreased awareness of deficits Awareness: Emergent Problem Solving: Slow processing;Difficulty sequencing General Comments: Pt relies on spouse and asking questions. pt reports stuff animal names are "kitty and "john". once returning to bed pt states "what did I name these? John? Did I name them yet?" pt perseverating on "OT"  "i have OT now John"    General Comments       Exercises       Shoulder Instructions      Home Living Family/patient expects to be discharged to:: Skilled nursing facility Living Arrangements: Spouse/significant other                               Additional Comments: Note possibility of D/C to inpatient psych facility, if not then will need SNF.        Prior Functioning/Environment Level of Independence: Independent with assistive device(s)        Comments: pt oxygen use at home last 2 weeks per spuse    OT Diagnosis: Generalized weakness;Cognitive deficits   OT  Problem List: Decreased strength;Decreased activity tolerance;Impaired balance (sitting and/or standing);Decreased safety awareness;Decreased cognition;Decreased knowledge of use of DME or AE   OT Treatment/Interventions: Self-care/ADL training;Therapeutic exercise;DME and/or AE instruction;Therapeutic activities;Cognitive remediation/compensation;Balance training;Patient/family education    OT Goals(Current goals can be found in the care plan section) Acute Rehab OT Goals Patient Stated Goal: "what do I do now?" OT Goal Formulation: With patient/family Time For Goal Achievement: 09/27/14 Potential to Achieve Goals: Good  OT Frequency: Min 2X/week   Barriers to D/C:            Co-evaluation              End of Session Equipment Utilized During Treatment: Gait belt;Rolling walker Nurse Communication: Mobility status;Precautions  Activity Tolerance: Patient tolerated treatment well Patient left: in bed;with call bell/phone within reach;with bed alarm set;with family/visitor present   Time: [id number] OT Time Calculation (min): 26 min Charges:  OT General Charges $OT Visit: 1 Procedure OT Evaluation $Initial OT Evaluation Tier I: 1 Procedure OT Treatments $Self Care/Home Management : 8-22 mins G-Codes:    Harolyn RutherfordJones, Ledora Delker B 09/13/2014, 9:27 AM Pager: [phone]

## 2014-09-13 NOTE — Progress Notes (Signed)
Physical Therapy Treatment Patient Details Name: Laurie Horne MRN: [id number] DOB: 06/06/1934 Today's Date: 09/13/2014    History of Present Illness 80 yo female s/p fall with IVH and respiratory failure. pt reports wishes to committ suicide. UTI (+) WUJ:[id number]:anxiety, HTN,depression, anorexia, home oxygen      PT Comments    Patient able to walk this session but she was unsteady. Patient easily distracted throughout talking about various topics from her looking bad to breakfast being "horrible" and so on. Patient also attempting to walk into other rooms stating that "she needed a new one and I don't like mine". Patients husband present throughout and was appreciative that patient was able to get up and walk. Will continue with current POC at this time  Follow Up Recommendations  SNF (unless patient goes to inpatient psych)     Equipment Recommendations  None recommended by PT    Recommendations for Other Services       Precautions / Restrictions Precautions Precaution Comments: pt with SI comments with and safety sitter.      Mobility  Bed Mobility   Bed Mobility: Supine to Sit;Sit to Supine     Supine to sit: Supervision Sit to supine: Modified independent (Device/Increase time)   General bed mobility comments: Supervision for safety as patient tangled up in sheets/foot pumps  Transfers Overall transfer level: Needs assistance Equipment used: Rolling walker (2 wheeled)   Sit to Stand: Min guard         General transfer comment: Minguard for safety as patient initially unsteady. Cues for safe hand placement  Ambulation/Gait Ambulation/Gait assistance: Min assist Ambulation Distance (Feet): 200 Feet Assistive device: Rolling walker (2 wheeled) Gait Pattern/deviations: Step-through pattern;Decreased stride length;Trunk flexed Gait velocity: varies from very fast to slow and distracted   General Gait Details: Patient required Min A for safe use of RW. Patient  attempting to go into other rooms stating " i want a new one, i don't like mind" Patient had to be redirected on task.    Stairs            Wheelchair Mobility    Modified Rankin (Stroke Patients Only) Modified Rankin (Stroke Patients Only) Pre-Morbid Rankin Score: Slight disability Modified Rankin: Moderately severe disability     Balance                                    Cognition Arousal/Alertness: Awake/alert Behavior During Therapy: WFL for tasks assessed/performed Overall Cognitive Status: Impaired/Different from baseline Area of Impairment: Orientation;Attention;Memory;Following commands;Safety/judgement;Awareness;Problem solving Orientation Level: Disoriented to;Situation Current Attention Level: Sustained Memory: Decreased recall of precautions;Decreased short-term memory Following Commands: Follows one step commands with increased time Safety/Judgement: Decreased awareness of safety;Decreased awareness of deficits Awareness: Emergent Problem Solving: Slow processing;Difficulty sequencing General Comments: Patient able to recall stuffed animals names this session after earlier session with OT. Patient forgetting at times where she is. Showed frstration with not having seen doctor yet and with breakfast choice    Exercises      General Comments        Pertinent Vitals/Pain Pain Assessment: No/denies pain    Home Living                      Prior Function            PT Goals (current goals can now be found in the care plan section) Progress towards PT  goals: Progressing toward goals    Frequency  Min 2X/week    PT Plan Current plan remains appropriate    Co-evaluation             End of Session Equipment Utilized During Treatment: Gait belt Activity Tolerance: Patient tolerated treatment well Patient left: in bed;with call bell/phone within reach     Time: 1116-1140 PT Time Calculation (min) (ACUTE ONLY): 24  min  Charges:  $Gait Training: 8-22 mins $Therapeutic Activity: 8-22 mins                    G Codes:      Fredrich BirksRobinette, Julia Elizabeth 09/13/2014, 2:06 PM  09/13/2014 Fredrich Birksobinette, Julia Elizabeth PTA [phone] pager [phone] office

## 2014-09-14 LAB — CBC WITH DIFFERENTIAL/PLATELET
BASOS PCT: 0 % (ref 0–1)
Basophils Absolute: 0 10*3/uL (ref 0.0–0.1)
EOS ABS: 0.1 10*3/uL (ref 0.0–0.7)
Eosinophils Relative: 1 % (ref 0–5)
HCT: 43.9 % (ref 36.0–46.0)
HEMOGLOBIN: 13.9 g/dL (ref 12.0–15.0)
Lymphocytes Relative: 21 % (ref 12–46)
Lymphs Abs: 1.8 10*3/uL (ref 0.7–4.0)
MCH: 29 pg (ref 26.0–34.0)
MCHC: 31.7 g/dL (ref 30.0–36.0)
MCV: 91.6 fL (ref 78.0–100.0)
Monocytes Absolute: 0.9 10*3/uL (ref 0.1–1.0)
Monocytes Relative: 10 % (ref 3–12)
NEUTROS PCT: 68 % (ref 43–77)
Neutro Abs: 5.8 10*3/uL (ref 1.7–7.7)
Platelets: 202 10*3/uL (ref 150–400)
RBC: 4.79 MIL/uL (ref 3.87–5.11)
RDW: 13 % (ref 11.5–15.5)
WBC: 8.6 10*3/uL (ref 4.0–10.5)

## 2014-09-14 LAB — COMPREHENSIVE METABOLIC PANEL
ALT: 14 U/L (ref 0–35)
ANION GAP: 5 (ref 5–15)
AST: 24 U/L (ref 0–37)
Albumin: 2.9 g/dL — ABNORMAL LOW (ref 3.5–5.2)
Alkaline Phosphatase: 83 U/L (ref 39–117)
BILIRUBIN TOTAL: 0.4 mg/dL (ref 0.3–1.2)
CHLORIDE: 88 meq/L — AB (ref 96–112)
CO2: 39 mmol/L — AB (ref 19–32)
CREATININE: 0.46 mg/dL — AB (ref 0.50–1.10)
Calcium: 8.9 mg/dL (ref 8.4–10.5)
GFR calc Af Amer: >90 mL/min (ref >90)
GFR calc non Af Amer: >90 mL/min (ref >90)
Glucose, Bld: 93 mg/dL (ref 70–99)
POTASSIUM: 4.6 mmol/L (ref 3.5–5.1)
Sodium: 132 mmol/L — ABNORMAL LOW (ref 135–145)
Total Protein: 5.8 g/dL — ABNORMAL LOW (ref 6.0–8.3)

## 2014-09-14 LAB — MAGNESIUM: Magnesium: 2 mg/dL (ref 1.5–2.5)

## 2014-09-14 NOTE — Progress Notes (Signed)
Patient needs gero-psychiatry once she has medically cleared.  Her family would [facility] [place].  The patient has suffered from anorexia the past three years.  She chews the food and typically will then spit it out without swallowing.  Swallowing studies have been WDL, so no physical reason for not swallowing.  Her weight remains in the 80 lb range.  She was also not doing her ADLs prior to admission and stated to her son, "You don't know what I might do" along with comments like "I don't want to be here."  Anhedonia, increase in sleep.  Poor memory and frustration with trying to remember even names.  Her husband and son were consulted at length and both agree she needs psychiatric assistance.    Nanine MeansJamison Lord, DNP, MSN, RN-BC, PMH-NP Dr. Jama Flavorsobos reviewed the patient and concurs with the plan. 09/14/2014  Case discussed with me as above

## 2014-09-14 NOTE — Progress Notes (Addendum)
[facility] PROGRESS NOTE  Laurie Horne ZOX:[id number] DOB: 11/04/1933 DOA: 09/06/2014 PCP: No primary care provider on file.  Admit HPI / Brief Narrative: 80 year old WF PMHx HTN, everyday smoker, Frequent Falls, Medication misuse, and presumed COPD.  [facility] after falls with acute respiratory failure. She was intubated in ICU and eventually discharged to SNF. Family reports that she has been anorexic for about the past 5 years and barely eats. She chews her food and spits it out. Watching her weight has been very important to her. Weight in ED is 87lbs. After being discharged from SNF her USOH was significantly diminished when compared to that of before her hospitalization. Husband states that she has not been herself for some time. Over the past several months her abilities have been reduced. She can barely walk anymore according to her husband and has had several falls over the last 7 days. Her husband states that she has struck her head hard several times while falling. He is also concerned that she has been overdosing on her prescribed medications, he is not sure if this is intentional or not. For that reason he has been assisting her with administration. 12/15 he thinks she took her meds again after he gave them to her because he found her unresponsive on the cough in the PM. [facility] and was noted to be in hypercarbic respiratory failure so she was placed on BiPAP. CT of the head was performed, which showed acute intracranial hemorrhage. Her mental status remains diminished. She was also bradycardic in ED. With concern that she overdosed on her home beta-blocker. She was given glucagon in ED with minimal response.   The patient was ultimately stabilized and transferred out of the ICU. The patient was evaluated by Psychiatry who has recommended transfer to inpatient psychiatry, with a specific goal for transfer [facility]. Disposition  is currently underway.  Assessment/Plan: Traumatic ICH in setting of frequent falls.  -Per neurosurgery no further diagnostic/therapeutic modalities required. -PT; recommends SNF, OT recs for SNF as well  Acute metabolic encephalopathy  -Most likely multifactorial to include acute ICH vs 2nd to ICH, UTI  Acute on chronic hypercarbic respiratory failure 2/2 presumed overdose  -Resolved; patient states does not use home O2  -Hx COPD without evidence of acute exacerbation -PRN BDs  Sinus bradycardia -Most likely secondary to beta blocker overdose; resolved  Hypotension  -Resolved -Stable overnight  HTN -Stable, albeit not ideally controlled -For now continue with amlodipine 5 mg daily -Hydralazine PRN to maintain SBP<170  Hyponatremia -Follow lytes  Hypochloremia -Improving -Continue to follow lytes  Protein calorie malnutrition -Diet regular with ensure between meals  Nicotine abuse -Continue nicotine patch  Depression -Per psychiatry patient is to be admitted [facility] [place], when medically cleared -Pt seen for the first time by myself today -See yesterday's note. Discussed case with Psychiatry who still believes pt will benefit greatly from inpatient psychiatry. Pending placement -would appreciate re-eval by psychiatry as pt is now out of ICU and more interactive on floor   Code Status: Full Family Communication: Pt in room, husband at bedside Disposition Plan: Pending tx to [facility]   Consultants:  Psychiatry  Critical Care  Procedures:    Antibiotics:    HPI/Subjective: Pt wants to go home. No acute events overnight  Objective: Filed Vitals:   09/14/14 0139 09/14/14 0500 09/14/14 0611 09/14/14 0952  BP: 139/74  158/92 140/66  Pulse: 83  76 88  Temp: 97.5 F (36.4 C)  98.3 F (36.8 C) 98.1 F (36.7 C)  TempSrc: Oral  Oral Oral  Resp: 16  16 16   Height:      Weight:  42.502 kg (93 lb  11.2 oz)    SpO2: 99%  98% 98%    Intake/Output Summary (Last 24 hours) at 09/14/14 1343 Last data filed at 09/13/14 2116  Gross per 24 hour  Intake      0 ml  Output     50 ml  Net    -50 ml   Filed Weights   09/12/14 0400 09/13/14 0806 09/14/14 0500  Weight: 41.9 kg (92 lb 6 oz) 43.001 kg (94 lb 12.8 oz) 42.502 kg (93 lb 11.2 oz)    Exam:   General:  Awake, in nad  Cardiovascular: regular, s1, s2  Respiratory: normal resp effort, no wheezing  Abdomen: soft,nondistended  Musculoskeletal: perfused, no clubbing   Data Reviewed: Basic Metabolic Panel:  Recent Labs Lab 09/10/14 1010 09/11/14 0447 09/11/14 1345 09/12/14 0318 09/13/14 0545 09/14/14 0636  NA 129* 129*  --  135* 135* 132*  K 4.9 5.2 4.0 4.2 4.4 4.6  CL 88* 90*  --  91* 92* 88*  CO2 28 32  --  34* 37* 39*  GLUCOSE 146* 94  --  97 94 93  BUN 6 12  --  14 10 <5*  CREATININE 0.33* 0.37*  --  0.37* 0.35* 0.46*  CALCIUM 9.6 9.0  --  9.1 9.4 8.9  MG 1.9 2.2  --  2.2 2.1 2.0   Liver Function Tests:  Recent Labs Lab 09/10/14 1010 09/11/14 0447 09/12/14 0318 09/13/14 0545 09/14/14 0636  AST 21 15 18 18 24   ALT 11 9 12 12 14   ALKPHOS 98 96 89 89 83  BILITOT 0.7 0.4 0.2* 0.3 0.4  PROT 6.6 5.7* 6.0 5.8* 5.8*  ALBUMIN 3.2* 2.7* 2.9* 2.9* 2.9*   No results for input(s): LIPASE, AMYLASE in the last 168 hours. No results for input(s): AMMONIA in the last 168 hours. CBC:  Recent Labs Lab 09/10/14 1010 09/11/14 0447 09/12/14 0318 09/13/14 0545 09/14/14 0636  WBC 10.4 10.9* 8.9 8.1 8.6  NEUTROABS 8.2* 7.7 6.2 5.8 5.8  HGB 15.6* 14.5 14.6 13.7 13.9  HCT 48.9* 44.1 46.9* 45.1 43.9  MCV 89.4 88.9 94.4 92.8 91.6  PLT 160 154 161 169 202   Cardiac Enzymes: No results for input(s): CKTOTAL, CKMB, CKMBINDEX, TROPONINI in the last 168 hours. BNP (last 3 results)  Recent Labs  11/21/13 1550  PROBNP 16993.0*   CBG: No results for input(s): GLUCAP in the last 168 hours.  Recent Results (from  the past 240 hour(s))  MRSA PCR Screening     Status: None   Collection Time: 09/07/14  5:55 AM  Result Value Ref Range Status   MRSA by PCR NEGATIVE NEGATIVE Final    Comment:        The GeneXpert MRSA Assay (FDA approved for NASAL specimens only), is one component of a comprehensive MRSA colonization surveillance program. It is not intended to diagnose MRSA infection nor to guide or monitor treatment for MRSA infections.   Culture, blood (routine x 2)     Status: None   Collection Time: 09/07/14  7:40 AM  Result Value Ref Range Status   Specimen Description BLOOD RIGHT WRIST  Final   Special Requests BOTTLES DRAWN AEROBIC ONLY 3CC  Final  Culture  Setup Time   Final    09/07/2014 13:12 Performed at [facility]    Culture   Final    NO GROWTH 5 DAYS Performed at [facility]    Report Status 09/13/2014 FINAL  Final  Culture, blood (routine x 2)     Status: None   Collection Time: 09/07/14  7:54 AM  Result Value Ref Range Status   Specimen Description BLOOD RIGHT HAND  Final   Special Requests BOTTLES DRAWN AEROBIC ONLY 2.5CC  Final   Culture  Setup Time   Final    09/07/2014 13:12 Performed at [facility]    Culture   Final    NO GROWTH 5 DAYS Note: Culture results may be compromised due to an inadequate volume of blood received in culture bottles. Performed at [facility]    Report Status 09/13/2014 FINAL  Final  Culture, Urine     Status: None   Collection Time: 09/10/14 11:45 AM  Result Value Ref Range Status   Specimen Description URINE, RANDOM  Final   Special Requests NONE  Final   Culture  Setup Time   Final    09/10/2014 21:24 Performed at [facility]   Final    75,000 COLONIES/ML Performed at [facility]    Culture   Final    Multiple bacterial morphotypes present, none predominant. Suggest appropriate recollection if clinically indicated. Performed at [facility]     Report Status 09/11/2014 FINAL  Final  Clostridium Difficile by PCR     Status: None   Collection Time: 09/11/14  2:00 PM  Result Value Ref Range Status   C difficile by pcr NEGATIVE NEGATIVE Final     Studies: No results found.  Scheduled Meds: . amLODipine  5 mg Oral Daily  . antiseptic oral rinse  7 mL Mouth Rinse q12n4p  . aspirin  81 mg Oral Daily  . brinzolamide  1 drop Both Eyes Daily  . cefTRIAXone (ROCEPHIN)  IV  1 g Intravenous Q24H  . chlordiazePOXIDE  5 mg Oral Daily  . chlorhexidine  15 mL Mouth Rinse BID  . feeding supplement (ENSURE COMPLETE)  237 mL Oral BID BM  . feeding supplement (PRO-STAT SUGAR FREE 64)  30 mL Oral Q1500  . latanoprost  1 drop Both Eyes QHS  . multivitamin with minerals  1 tablet Oral Daily  . nicotine  14 mg Transdermal Q24H  . sodium chloride  3 mL Intravenous Q12H   Continuous Infusions:   Active Problems:   ICH (intracerebral hemorrhage)   Anxiety   Depression   Acute on chronic respiratory failure with hypercapnia   Adverse reaction to beta-blocker   Altered awareness, transient   Sinus bradycardia   Other specified hypotension   Essential hypertension   Hyponatremia   Protein-calorie malnutrition   Nicotine abuse   Intracranial bleeding   Suicide attempt   Traumatic intracranial hemorrhage   Metabolic encephalopathy   Hyperkalemia   Anxiety state   Skin lesion of right lower extremity   Bradycardia, sinus   Diarrhea  Time spent:  CHIU, STEPHEN K  [facility] Pager [phone]. If 7PM-7AM, please contact night-coverage at [email or web address], password [email or web address] [email or web address] 09/14/2014, 1:43 PM  LOS: 8 days

## 2014-09-14 NOTE — Clinical Social Work Psych Note (Signed)
Covering Psych CSW met with patient and patient's husband Laurie Horne) at bedside to discuss discharge disposition and complete psych assessment. Covering Psych CSW introduced self to patient. Patient presented with confusion and repetitively asking same questions. Covering Psych CSW questioned patient regarding patient's recollection as to why patient was admitted to [facility]. Patient stated patient "fell at home" and was brought to [facility] ED. Patient denied ingesting HTN medication and denied stating patient was actively suicidal. Patient informed covering Psych CSW patient was not sure as to why patient would state patient was suicidal, stating "but everyone keeps telling me I did." Patient granted covering Psych CSW permission to speak with patient's husband.  Covering Psych CSW spoke with patient's husband regarding patient's recent medical history. Per patient's husband, patient has a 2-3 year history of falls and increasing confusion. Patient reports on the night of patient's admission to [facility], patient requested assistance from patient's husband in dispensing patient's medications. Patient's husband states that patient was found to have taken patient's medication and acting "drowsy" upon patient's husband entering room with patient to assist with medication. Patient's husband informed covering Psych CSW patient's husband called Poison Control and 9-1-1 upon entering room with patient to assist with medication. Patient's husband reports patient was active in the community [employer], volunteering at [facility], and playing tennis) until a few years ago when patient began falling at home. Patient's husband informed covering Psych CSW patient is followed by NP in the community, after a year of switching PCP's. Patient's husband reports patient has no history of suicidal attempts or ideations.  During assessment, patient presented at bedside with confusion. Patient repeatedly asking for NT sitter name and requesting  RN to being eye drops. RN reports patient was given eye drops at appropriate time and patient no able to recall patient received eye drops earlier in the day.   Covering Psych CSW updated RN, MD, [facility] Medical Director, and CSW Director with above findings.  Covering Psych CSW recommendation: Patient to be discharged to SNF with outpatient follow-up with PCP.  Lubertha Sayres, [place] [phone]) Licensed Clinical Social Worker Orthopedics [phone]) and Surgical [phone])

## 2014-09-14 NOTE — Clinical Social Work Psych Note (Signed)
Covering Psych CSW received notification from 4N/3S CSW regarding pt's discharge disposition. Per chart review, pt to be discharged [facility] at time of discharge.   Covering Psych CSW followed-up with referral (made on 09/12/2014) by Weekend CSW. No beds available at [facility].  Covering Psych CSW spoke with MD and requested Psych consult be placed for updated psychiatric clinicals. Covering Psych CSW to continue to follow and assist with discharge planning needs.  Marcelline Deistmily Meshilem Machuca, LCSWA [phone]) Licensed Clinical Social Worker Orthopedics [phone]([id number]) and Surgical [phone]([id number])

## 2014-09-14 NOTE — Clinical Social Work Note (Signed)
CSW spoke with the covering CSW for the psychiatric social worker regarding the disposition to [facility]. The covering CSW reported that she will check into the disposition. The pt has 3 SNF bed offers. Per handoff from 54M CSW the pt received rehab from [facility] in the past and the family is open to the pt transitioning to [facility]. CSW spoke with Victorino DikeJennifer at [facility] to inquire about a bed offer. Victorino DikeJennifer reported she will have a final decision today.   Treyvone Chelf, MSW, LCSWA [phone]

## 2014-09-15 ENCOUNTER — Inpatient Hospital Stay (HOSPITAL_COMMUNITY): Payer: Medicare Other

## 2014-09-15 DIAGNOSIS — R413 Other amnesia: Secondary | ICD-10-CM

## 2014-09-15 DIAGNOSIS — R1310 Dysphagia, unspecified: Secondary | ICD-10-CM

## 2014-09-15 LAB — CBC WITH DIFFERENTIAL/PLATELET
BASOS ABS: 0.1 10*3/uL (ref 0.0–0.1)
Basophils Relative: 1 % (ref 0–1)
EOS PCT: 2 % (ref 0–5)
Eosinophils Absolute: 0.2 10*3/uL (ref 0.0–0.7)
HEMATOCRIT: 43.6 % (ref 36.0–46.0)
Hemoglobin: 13.5 g/dL (ref 12.0–15.0)
LYMPHS PCT: 20 % (ref 12–46)
Lymphs Abs: 1.5 10*3/uL (ref 0.7–4.0)
MCH: 28.4 pg (ref 26.0–34.0)
MCHC: 31 g/dL (ref 30.0–36.0)
MCV: 91.8 fL (ref 78.0–100.0)
Monocytes Absolute: 0.9 10*3/uL (ref 0.1–1.0)
Monocytes Relative: 11 % (ref 3–12)
Neutro Abs: 5 10*3/uL (ref 1.7–7.7)
Neutrophils Relative %: 66 % (ref 43–77)
PLATELETS: 204 10*3/uL (ref 150–400)
RBC: 4.75 MIL/uL (ref 3.87–5.11)
RDW: 13 % (ref 11.5–15.5)
WBC: 7.6 10*3/uL (ref 4.0–10.5)

## 2014-09-15 LAB — MAGNESIUM: Magnesium: 2 mg/dL (ref 1.5–2.5)

## 2014-09-15 MED ORDER — ENSURE COMPLETE PO LIQD
237.0000 mL | Freq: Three times a day (TID) | ORAL | Status: DC
  Administered 2014-09-15 – 2014-09-21 (×14): 237 mL via ORAL

## 2014-09-15 MED ORDER — THIAMINE HCL 100 MG/ML IJ SOLN
500.0000 mg | Freq: Once | INTRAVENOUS | Status: AC
  Administered 2014-09-15: 500 mg via INTRAVENOUS
  Filled 2014-09-15: qty 5

## 2014-09-15 MED ORDER — VITAMIN B-1 100 MG PO TABS
100.0000 mg | ORAL_TABLET | Freq: Every day | ORAL | Status: DC
  Administered 2014-09-16 – 2014-09-23 (×6): 100 mg via ORAL
  Filled 2014-09-15 (×6): qty 1

## 2014-09-15 NOTE — Progress Notes (Signed)
[facility] PROGRESS NOTE  Wonda ChengJoan C Bethune EAV:[id number] DOB: 08/17/1934 DOA: 09/06/2014  Brief HPI: 80 year old WF PMHx HTN, everyday smoker, Frequent Falls, Medication misuse, and presumed COPD.  [facility] after falls with acute respiratory failure. She was intubated in ICU and eventually discharged to SNF. Family reports that she has been anorexic for about the past 5 years and barely eats. She chews her food and spits it out. Watching her weight has been very important to her. Weight in ED is 87lbs. Husband states that she has not been herself for some time. Over the past several months her abilities have been reduced. She can barely walk anymore according to her husband and has had several falls over the last 7 days. Her husband states that she has struck her head hard several times while falling. He is also concerned that she has been overdosing on her prescribed medications, he is not sure if this is intentional or not. For that reason he has been assisting her with administration. 12/15 he thinks she took her meds again after he gave them to her because he found her unresponsive on the cough in the PM. [facility] and was noted to be in hypercarbic respiratory failure so she was placed on BiPAP. CT of the head was performed, which showed acute intracranial hemorrhage. The patient was ultimately stabilized and transferred out of the ICU. The patient was evaluated by Psychiatry who has recommended transfer to inpatient psychiatry, with a specific goal for transfer to [facility]. Disposition is currently underway.  Assessment/Plan:  Traumatic ICH in setting of frequent falls.  -Per neurosurgery no further diagnostic/therapeutic modalities required. -PT/OT recommends SNF  Dysphagia Patient mentioned that she doesn't eat appropriately because she has difficulty swallowing. However, this is a very inconsistent history. Her husband states  that he has seen her swallowing without difficulty. There is concern that she primarily has anorexia. We will get a barium swallow to further investigate.  Depression -Per psychiatry patient is to be admitted to [facility] [place], when medically cleared -Psyche to re-evaluate.  -Since they could be an element of cognitive impairment as well. We will go ahead and get neurology input. -Unclear if her drug overdose was intentional or not. Cognitive impairment and may have contributed.  Acute metabolic encephalopathy  -Most likely multifactorial to include acute ICH vs 2nd to ICH, UTI -Now appears back to baseline  Acute on chronic hypercarbic respiratory failure 2/2 presumed overdose  -Resolved; patient states does not use home O2  -Hx COPD without evidence of acute exacerbation -PRN BDs  Sinus bradycardia -Most likely secondary to beta blocker overdose; resolved  Hypotension  -Resolved  HTN -Stable,  -For now continue with amlodipine 5 mg daily -Hydralazine PRN to maintain SBP<170  Hyponatremia Stable. Follow lytes  Protein calorie malnutrition -Diet regular with ensure between meals  Nicotine abuse -Continue nicotine patch  DVT prophylaxis: SCDs Code Status: Full code Family Communication: Pt in room, husband at bedside Disposition Plan: Pending tx to [facility]   Consultants:  Psychiatry  Critical Care  Neurology   Subjective: Denies any complaints. She wants to go home.   Objective: Filed Vitals:   09/14/14 2342 09/15/14 0318 09/15/14 0609 09/15/14 0926  BP: 146/82 148/90 148/81 126/85  Pulse:  84 77 89  Temp:  98.1 F (36.7 C) 98.4 F (36.9 C) 98.5 F (36.9 C)  TempSrc:  Oral Oral Oral  Resp:  16 18 20   Height:      Weight:   40.098 kg (88 lb 6.4 oz)   SpO2:  100% 100% 95%    Intake/Output Summary (Last 24 hours) at 09/15/14 1118 Last data filed at 09/15/14 1030  Gross per 24 hour  Intake    483 ml   Output   1301 ml  Net   -818 ml   Filed Weights   09/13/14 0806 09/14/14 0500 09/15/14 0609  Weight: 43.001 kg (94 lb 12.8 oz) 42.502 kg (93 lb 11.2 oz) 40.098 kg (88 lb 6.4 oz)    Exam:   General:  Awake, in nad  Cardiovascular: regular, s1, s2  Respiratory: Clear to auscultation bilaterally.  Abdomen: Soft, nontender, nondistended. Bowel sounds present.   Data Reviewed: Basic Metabolic Panel:  Recent Labs Lab 09/10/14 1010 09/11/14 0447 09/11/14 1345 09/12/14 0318 09/13/14 0545 09/14/14 0636 09/15/14 0447  NA 129* 129*  --  135* 135* 132*  --   K 4.9 5.2 4.0 4.2 4.4 4.6  --   CL 88* 90*  --  91* 92* 88*  --   CO2 28 32  --  34* 37* 39*  --   GLUCOSE 146* 94  --  97 94 93  --   BUN 6 12  --  14 10 <5*  --   CREATININE 0.33* 0.37*  --  0.37* 0.35* 0.46*  --   CALCIUM 9.6 9.0  --  9.1 9.4 8.9  --   MG 1.9 2.2  --  2.2 2.1 2.0 2.0   Liver Function Tests:  Recent Labs Lab 09/10/14 1010 09/11/14 0447 09/12/14 0318 09/13/14 0545 09/14/14 0636  AST 21 15 18 18 24   ALT 11 9 12 12 14   ALKPHOS 98 96 89 89 83  BILITOT 0.7 0.4 0.2* 0.3 0.4  PROT 6.6 5.7* 6.0 5.8* 5.8*  ALBUMIN 3.2* 2.7* 2.9* 2.9* 2.9*   CBC:  Recent Labs Lab 09/11/14 0447 09/12/14 0318 09/13/14 0545 09/14/14 0636 09/15/14 0447  WBC 10.9* 8.9 8.1 8.6 7.6  NEUTROABS 7.7 6.2 5.8 5.8 5.0  HGB 14.5 14.6 13.7 13.9 13.5  HCT 44.1 46.9* 45.1 43.9 43.6  MCV 88.9 94.4 92.8 91.6 91.8  PLT 154 161 169 202 204     Recent Results (from the past 240 hour(s))  MRSA PCR Screening     Status: None   Collection Time: 09/07/14  5:55 AM  Result Value Ref Range Status   MRSA by PCR NEGATIVE NEGATIVE Final    Comment:        The GeneXpert MRSA Assay (FDA approved for NASAL specimens only), is one component of a comprehensive MRSA colonization surveillance program. It is not intended to diagnose MRSA infection nor to guide or monitor treatment for MRSA infections.   Culture, blood (routine x  2)     Status: None   Collection Time: 09/07/14  7:40 AM  Result Value Ref Range Status   Specimen Description BLOOD RIGHT WRIST  Final   Special Requests BOTTLES DRAWN AEROBIC ONLY 3CC  Final   Culture  Setup Time   Final    09/07/2014 13:12 Performed at [facility]    Culture   Final    NO GROWTH 5 DAYS Performed at [facility]    Report Status 09/13/2014 FINAL  Final  Culture, blood (routine x 2)     Status: None   Collection Time: 09/07/14  7:54 AM  Result Value Ref  Range Status   Specimen Description BLOOD RIGHT HAND  Final   Special Requests BOTTLES DRAWN AEROBIC ONLY 2.5CC  Final   Culture  Setup Time   Final    09/07/2014 13:12 Performed at [facility]    Culture   Final    NO GROWTH 5 DAYS Note: Culture results may be compromised due to an inadequate volume of blood received in culture bottles. Performed at [facility]    Report Status 09/13/2014 FINAL  Final  Culture, Urine     Status: None   Collection Time: 09/10/14 11:45 AM  Result Value Ref Range Status   Specimen Description URINE, RANDOM  Final   Special Requests NONE  Final   Culture  Setup Time   Final    09/10/2014 21:24 Performed at [facility]   Final    75,000 COLONIES/ML Performed at [facility]    Culture   Final    Multiple bacterial morphotypes present, none predominant. Suggest appropriate recollection if clinically indicated. Performed at [facility]    Report Status 09/11/2014 FINAL  Final  Clostridium Difficile by PCR     Status: None   Collection Time: 09/11/14  2:00 PM  Result Value Ref Range Status   C difficile by pcr NEGATIVE NEGATIVE Final     Studies: No results found.  Scheduled Meds: . amLODipine  5 mg Oral Daily  . antiseptic oral rinse  7 mL Mouth Rinse q12n4p  . aspirin  81 mg Oral Daily  . brinzolamide  1 drop Both Eyes Daily  . cefTRIAXone (ROCEPHIN)  IV  1 g Intravenous Q24H  .  chlordiazePOXIDE  5 mg Oral Daily  . chlorhexidine  15 mL Mouth Rinse BID  . feeding supplement (ENSURE COMPLETE)  237 mL Oral BID BM  . feeding supplement (PRO-STAT SUGAR FREE 64)  30 mL Oral Q1500  . latanoprost  1 drop Both Eyes QHS  . multivitamin with minerals  1 tablet Oral Daily  . nicotine  14 mg Transdermal Q24H  . sodium chloride  3 mL Intravenous Q12H   Continuous Infusions:   Active Problems:   ICH (intracerebral hemorrhage)   Anxiety   Depression   Acute on chronic respiratory failure with hypercapnia   Adverse reaction to beta-blocker   Altered awareness, transient   Sinus bradycardia   Other specified hypotension   Essential hypertension   Hyponatremia   Protein-calorie malnutrition   Nicotine abuse   Intracranial bleeding   Suicide attempt   Traumatic intracranial hemorrhage   Metabolic encephalopathy   Hyperkalemia   Anxiety state   Skin lesion of right lower extremity   Bradycardia, sinus   Diarrhea  Time spent:  [facility]  [facility] Pager [phone].   If 7PM-7AM, please contact night-coverage at [email or web address], password [facility] 09/15/2014, 11:18 AM  LOS: 9 days

## 2014-09-15 NOTE — Consult Note (Addendum)
Neurology Consultation Reason for Consult: Cognitive difficulty Referring Physician: Barnie DelKrishnan, G  CC: Confusion  History is obtained from: Patient, husband  HPI: Laurie Horne is a 80 y.o. female who presented with altered mental status that was felt to be secondary to taking too much medication. She was hypercarbic in the ER and started on BiPAP. Head CT at that time showed hemorrhage in the lateral ventricles without hydrocephalus. She was admitted and has improved. Her husband states that for the past month she has had increasing difficult with her memory. She fell approximately one month ago and did hit her head and skinned her nose. She is not certain if she became dazed at that time or not. Prior to this, her husband states that he had no concerns about her memory. She has been making comments in the past which led her husband to wonder about suicidality. She has been evaluated and is in preparation to move to the inpatient psychiatric care.      ROS: A 14 point ROS was performed and is negative except as noted in the HPI.  Past Medical History  Diagnosis Date  . Hyperlipidemia   . Hypertension   . History of tobacco abuse     Family History: Alcoholism  Social History: Tob: Current smoker  Exam: Current vital signs: BP 140/70 mmHg  Pulse 83  Temp(Src) 97.7 F (36.5 C) (Oral)  Resp 20  Ht 5\' 5"  (1.651 m)  Wt 40.098 kg (88 lb 6.4 oz)  BMI 14.71 kg/m2  SpO2 99% Vital signs in last 24 hours: Temp:  [97.7 F (36.5 C)-98.8 F (37.1 C)] 97.7 F (36.5 C) (12/23 1807) Pulse Rate:  [77-89] 83 (12/23 1807) Resp:  [16-20] 20 (12/23 1807) BP: (126-157)/(70-90) 140/70 mmHg (12/23 1807) SpO2:  [95 %-100 %] 99 % (12/23 1807) Weight:  [40.098 kg (88 lb 6.4 oz)] 40.098 kg (88 lb 6.4 oz) (12/23 16100609)   Physical Exam  Constitutional: Appears malnourished Psych: Affect appropriate to situation Eyes: No scleral injection ENT: No OP obstrucion Head: Normocephalic. Healing cut  on her fore head Cardiovascular: Normal rate and regular rhythm.  Respiratory: Effort normal and breath sounds normal to anterior ascultation GI: Soft.  No distension. There is no tenderness.  Skin: WDI  Neuro: Mental Status: Patient is awake, alert, oriented to person, place, month, year, and situation. She is able to spell world backwards, but unable to subtract 7 from 100. # of quarters in $2.75 is reported as 7.  Recall at 5 minutes is 1/3 No signs of aphasia or neglect Cranial Nerves: II: Visual Fields are full. Pupils are equal, round, and reactive to light.   III,IV, VI: EOMI without ptosis or diploplia.  V: Facial sensation is symmetric to temperature VII: Facial movement is symmetric.  VIII: hearing is intact to voice X: Uvula elevates symmetrically XI: Shoulder shrug is symmetric. XII: tongue is midline without atrophy or fasciculations.  Motor: Tone is normal. Bulk is normal. 5/5 strength was present in all four extremities.  Sensory: Sensation is diminished in her legs to vibration.  Deep Tendon Reflexes: 2+ and symmetric in the biceps and patellae. Decreased at ankles.  Cerebellar: FNF and HKS are intact bilaterally   I have reviewed labs in epic and the results pertinent to this consultation are: TSH wnl  I have reviewed the images obtained:CT head - mild IVH  Impression: 80 yo F with memory problems. Given the temporal correlation ot her falls, I do wonder about post-concussive syndrome as  an etiology for this. Also with malnourishment, thiamine deficiciency is possible. I ordered a thiamine level and repletion, unfortunately. B12 would be another treatable cause of cognitive dysfunction that should be investigated.  She received a small amount of IV thiamine prior to the level being drawn and therefore this is unlikely to be helpful. I suepct that her gait disorder is multifactorial including neuropathy.   One final thought, though unlikley would be that she has  developed hcps, and a repeat CT scan would be reasonable. I feel this is low yield.   Recommendations: 1) Thiamine 100mg  qday 2) f/u B12, if normal but < 400, check MMA, if low, replete.  3) check a1c for neuropathy.  4) If the above are normal, then psychometric testing to evaluate for pseudodementia could be helpful, but I suspect that this would represent post concussive syndrome for which treatment is supportive.  5) CT head, will follow up.  6) If CT head is not worse, then no further recommendations, please call with any further questions or concerns.  7) f/u as outpatient with neurology.   Ritta SlotMcNeill Ethanjames Fontenot, MD [facility] [phone]  If 7pm- 7am, please page neurology on call as listed in AMION.

## 2014-09-15 NOTE — Progress Notes (Signed)
Occupational Therapy Treatment Patient Details Name: Laurie Horne MRN: [id number] DOB: 02/02/1934 Today's Date: 09/15/2014    History of present illness 80 yo female s/p fall with IVH and respiratory failure. pt reports wishes to committ suicide. UTI (+) WUJ:[id number]:anxiety, HTN,depression, anorexia, home oxygen     OT comments  Pt progressing toward goals and complete bathroom transfers. Pt currently with sitter and spouse.   Follow Up Recommendations  SNF;Supervision/Assistance - 24 hour    Equipment Recommendations       Recommendations for Other Services      Precautions / Restrictions Precautions Precautions: Fall Precaution Comments: oxygen tank 2 L Restrictions Weight Bearing Restrictions: No       Mobility Bed Mobility Overal bed mobility: Modified Independent                Transfers Overall transfer level: Needs assistance Equipment used: Rolling walker (2 wheeled) Transfers: Sit to/from Stand Sit to Stand: Min guard              Balance Overall balance assessment: Needs assistance         Standing balance support: Bilateral upper extremity supported;During functional activity Standing balance-Leahy Scale: Poor                     ADL Overall ADL's : Needs assistance/impaired Eating/Feeding: Supervision/ safety Eating/Feeding Details (indicate cue type and reason): pt placing tomatoes in mouth and spitting them back out Grooming: Wash/dry hands;Oral care;Moderate assistance Grooming Details (indicate cue type and reason): unsteady static standing. pt reports dizziness                 Toilet Transfer: Moderate assistance;BSC;RW   Toileting- Clothing Manipulation and Hygiene: Min guard;Sitting/lateral lean       Functional mobility during ADLs: Minimal assistance;Rolling walker General ADL Comments: Pt agreeable to OOB and sink level task. pt voiding bladder on toilet. Pt with balance deficits. Pt with STM deficits.       Vision                     Perception     Praxis      Cognition   Behavior During Therapy: [facility] for tasks assessed/performed Overall Cognitive Status: Impaired/Different from baseline Area of Impairment: Awareness;Safety/judgement;Orientation;Problem solving Orientation Level: Disoriented to;Situation Current Attention Level: Sustained    Following Commands: Follows one step commands consistently Safety/Judgement: Decreased awareness of safety;Decreased awareness of deficits Awareness: Intellectual Problem Solving: Slow processing General Comments: Pt with STM deficits. Pt asking repetitive questions    Extremity/Trunk Assessment               Exercises     Shoulder Instructions       General Comments      Pertinent Vitals/ Pain       Pain Assessment: No/denies pain  Home Living                                          Prior Functioning/Environment              Frequency Min 2X/week     Progress Toward Goals  OT Goals(current goals can now be found in the care plan section)  Progress towards OT goals: Progressing toward goals  Acute Rehab OT Goals Patient Stated Goal: "i love tomatoe"  OT Goal Formulation: With patient/family Time For Goal Achievement: 09/27/14  Potential to Achieve Goals: Good ADL Goals Pt Will Perform Grooming: with supervision;sitting Pt Will Perform Upper Body Bathing: with supervision;sitting Pt Will Perform Lower Body Bathing: with min assist;sit to/from stand Pt Will Transfer to Toilet: with supervision;regular height toilet;ambulating;grab bars  Plan Discharge plan remains appropriate    Co-evaluation                 End of Session Equipment Utilized During Treatment: Gait belt;Rolling walker   Activity Tolerance Patient tolerated treatment well   Patient Left in bed;with call bell/phone within reach;with bed alarm set   Nurse Communication Mobility status;Precautions         Time: 1313 (1313)-1330 OT Time Calculation (min): 17 min  Charges: OT General Charges $OT Visit: 1 Procedure OT Treatments $Self Care/Home Management : 8-22 mins  Boone MasterJones, Albaro Deviney B 09/15/2014, 2:26 PM Pager: [phone]

## 2014-09-15 NOTE — Progress Notes (Signed)
PT Cancellation Note  Patient Details Name: Laurie ChengJoan C Gunnels MRN: [id number] DOB: 03/29/1934   Cancelled Treatment:    Reason Eval/Treat Not Completed: Patient at procedure or test/unavailable. Will follow up tomorrow as able   Fredrich BirksRobinette, Decorian Schuenemann Elizabeth 09/15/2014, 2:29 PM 09/15/2014 Fredrich Birksobinette, Emmanuelle Hibbitts Elizabeth PTA [phone] pager [phone] office

## 2014-09-15 NOTE — Progress Notes (Signed)
NUTRITION FOLLOW-UP  DOCUMENTATION CODES Per approved criteria  -Severe malnutrition in the context of chronic illness -Underweight   INTERVENTION: Provide Ensure Complete po to TID, each supplement provides 350 kcal and 13 grams of protein  Discontinue Prostat.  Recommend swallow evaluation as pt reports swallowing difficulties.  Encourage adequate PO intake.  NUTRITION DIAGNOSIS: Malnurition related to chronic illness as evidenced by severe fat and muscle depletion; ongoing.   Goal: Pt to meet >/= 90% of their estimated nutrition needs; not met.   Monitor:  PO intake, supplement acceptance, weight trends, labs  ASSESSMENT: Pt admitted from home where she lives with her husband after being found unresponsive. Husband concerned that pt took a double dose of her medications. Per family she has been depressed lately.    Pt reports her appetite is fine, however says she has been having trouble swallowing her food and is unable to identify the reasoning. Meal completion has been 55-75%. Pt reports she has been drinking her Ensure, however reports not liking the Prostat and request to discontinue it. RD to modify and increase Ensure to help in caloric and protein needs. Pt was encouraged to eat her food at meals.   Height: Ht Readings from Last 1 Encounters:  09/07/14 5' 5" (1.651 m)    Weight: Wt Readings from Last 1 Encounters:  09/15/14 88 lb 6.4 oz (40.098 kg)    BMI:  Body mass index is 14.71 kg/(m^2). Underweight  Re-Estimated Nutritional Needs: Kcal: 1300-1500 Protein: 60-75 grams Fluid: > 1.5 L/day  Skin: multiple scabbed areas to all extremities   Diet Order: Diet regular   Intake/Output Summary (Last 24 hours) at 09/15/14 1426 Last data filed at 09/15/14 1030  Gross per 24 hour  Intake    483 ml  Output   1301 ml  Net   -818 ml    Last BM: 12/21  Labs:   Recent Labs Lab 09/12/14 0318 09/13/14 0545 09/14/14 0636 09/15/14 0447  NA 135* 135*  132*  --   K 4.2 4.4 4.6  --   CL 91* 92* 88*  --   CO2 34* 37* 39*  --   BUN 14 10 <5*  --   CREATININE 0.37* 0.35* 0.46*  --   CALCIUM 9.1 9.4 8.9  --   MG 2.2 2.1 2.0 2.0  GLUCOSE 97 94 93  --     CBG (last 3)  No results for input(s): GLUCAP in the last 72 hours.  Scheduled Meds: . amLODipine  5 mg Oral Daily  . antiseptic oral rinse  7 mL Mouth Rinse q12n4p  . aspirin  81 mg Oral Daily  . brinzolamide  1 drop Both Eyes Daily  . chlordiazePOXIDE  5 mg Oral Daily  . chlorhexidine  15 mL Mouth Rinse BID  . feeding supplement (ENSURE COMPLETE)  237 mL Oral BID BM  . feeding supplement (PRO-STAT SUGAR FREE 64)  30 mL Oral Q1500  . latanoprost  1 drop Both Eyes QHS  . multivitamin with minerals  1 tablet Oral Daily  . nicotine  14 mg Transdermal Q24H  . sodium chloride  3 mL Intravenous Q12H    Continuous Infusions:    Stephanie La, MS, RD, LDN Pager # [phone] After hours/ weekend pager # [phone]   

## 2014-09-16 DIAGNOSIS — Z008 Encounter for other general examination: Secondary | ICD-10-CM

## 2014-09-16 LAB — BASIC METABOLIC PANEL
ANION GAP: 7 (ref 5–15)
BUN: 6 mg/dL (ref 6–23)
CALCIUM: 9.4 mg/dL (ref 8.4–10.5)
CHLORIDE: 86 meq/L — AB (ref 96–112)
CO2: 39 mmol/L — ABNORMAL HIGH (ref 19–32)
CREATININE: 0.5 mg/dL (ref 0.50–1.10)
GFR calc non Af Amer: >90 mL/min (ref >90)
Glucose, Bld: 92 mg/dL (ref 70–99)
Potassium: 4.8 mmol/L (ref 3.5–5.1)
Sodium: 132 mmol/L — ABNORMAL LOW (ref 135–145)

## 2014-09-16 LAB — CBC
HEMATOCRIT: 44.2 % (ref 36.0–46.0)
Hemoglobin: 14 g/dL (ref 12.0–15.0)
MCH: 28.9 pg (ref 26.0–34.0)
MCHC: 31.7 g/dL (ref 30.0–36.0)
MCV: 91.1 fL (ref 78.0–100.0)
Platelets: 223 10*3/uL (ref 150–400)
RBC: 4.85 MIL/uL (ref 3.87–5.11)
RDW: 12.9 % (ref 11.5–15.5)
WBC: 8.2 10*3/uL (ref 4.0–10.5)

## 2014-09-16 LAB — HEMOGLOBIN A1C
Hgb A1c MFr Bld: 5.9 % — ABNORMAL HIGH (ref <5.7)
Mean Plasma Glucose: 123 mg/dL — ABNORMAL HIGH (ref <117)

## 2014-09-16 LAB — VITAMIN B12: Vitamin B-12: 858 pg/mL (ref 211–911)

## 2014-09-16 MED ORDER — PANTOPRAZOLE SODIUM 40 MG IV SOLR
40.0000 mg | INTRAVENOUS | Status: DC
Start: 2014-09-16 — End: 2014-09-19
  Administered 2014-09-16 – 2014-09-18 (×3): 40 mg via INTRAVENOUS
  Filled 2014-09-16 (×3): qty 40

## 2014-09-16 NOTE — Clinical Social Work Note (Signed)
CSW received a call from [facility]arlene at [facility] [place]. Darlene confirmed receiving the pt's referral on 09/12/2014. Darlene reported that she will not have any bed availability for the remainder of the week. CSW will collaborate with the psychiatrist to discuss discharge plan. CSW will continue to follow and assist with discharge.   Bama Hanselman, MSW, LCSWA [phone]

## 2014-09-16 NOTE — Progress Notes (Signed)
Physical Therapy Treatment Patient Details Name: Laurie ChengJoan C Vivona MRN: [id number] DOB: 11/28/1933 Today's Date: 09/16/2014    History of Present Illness 80 yo female s/p fall with IVH and respiratory failure. pt reports wishes to committ suicide. UTI (+) BJY:[id number]:anxiety, HTN,depression, anorexia, home oxygen      PT Comments    Patient progressing with ambulation this session with increased safety awareness and use of RW. Patient required encouragement to sit up in the chair to attempt to eat breakfast. Patient and sitter to take another walk later today. Sitter very encouraging to patient and agreeable to walk with patient later today.   Follow Up Recommendations  SNF     Equipment Recommendations  None recommended by PT    Recommendations for Other Services       Precautions / Restrictions Precautions Precautions: Fall Precaution Comments: oxygen tank 2 L    Mobility  Bed Mobility Overal bed mobility: Modified Independent                Transfers Overall transfer level: Needs assistance Equipment used: Rolling walker (2 wheeled)   Sit to Stand: Min guard         General transfer comment: Minguard for safety as patient initially unsteady. Cues for safe hand placement and not to pull up from RW  Ambulation/Gait Ambulation/Gait assistance: Min guard Ambulation Distance (Feet): 300 Feet Assistive device: Rolling walker (2 wheeled) Gait Pattern/deviations: Step-through pattern;Decreased stride length;Trunk flexed   Gait velocity interpretation: Below normal speed for age/gender General Gait Details: Patient with better control with gait this session and safer use of RW. Cues to stay closer into RW and stand upright   Stairs            Wheelchair Mobility    Modified Rankin (Stroke Patients Only) Modified Rankin (Stroke Patients Only) Pre-Morbid Rankin Score: Slight disability Modified Rankin: Moderately severe disability     Balance                                    Cognition Arousal/Alertness: Awake/alert Behavior During Therapy: WFL for tasks assessed/performed Overall Cognitive Status: Impaired/Different from baseline Area of Impairment: Awareness;Safety/judgement;Orientation;Problem solving Orientation Level: Situation Current Attention Level: Sustained     Safety/Judgement: Decreased awareness of safety;Decreased awareness of deficits     General Comments: Patient stating that she wasn't going to eat and that she didn't understand why she is still here    Exercises      General Comments        Pertinent Vitals/Pain Pain Assessment: No/denies pain    Home Living                      Prior Function            PT Goals (current goals can now be found in the care plan section) Progress towards PT goals: Progressing toward goals    Frequency  Min 2X/week    PT Plan Current plan remains appropriate    Co-evaluation             End of Session   Activity Tolerance: Patient tolerated treatment well Patient left: in chair;with call bell/phone within reach;with nursing/sitter in room     Time: 0714-0731 PT Time Calculation (min) (ACUTE ONLY): 17 min  Charges:  $Gait Training: 8-22 mins  G Codes:      Fredrich BirksRobinette, Manie Bealer Elizabeth 09/16/2014, 7:50 AM 09/16/2014 Fredrich Birksobinette, Olesya Wike Elizabeth PTA [phone] pager [phone] office

## 2014-09-16 NOTE — Consult Note (Addendum)
Referring Provider: Dr. Krishnan Primary Care Physician:  No primary care provider on file. Primary Gastroenterologist:  Unassigned  Reason for Consultation:  Dysphagia  HPI: Laurie Horne is a 80 y.o. female who was admitted for recurrent falls and intracranial hemorrhage being seen for a consult due to dysphagia. Reports 1-2 year history of intermittent solid food dysphagia stating that at times with swallowing food she will feel that it hangs up in her throat. Sensation occurs at various times of the meal. She also admits to frequently chewing meats especially and other solid foods in her mouth and then will spit them out and not try and swallow them. Denies coughing or choking with swallowing. Her husband states she has done the chewing routine where she spits her food out for a while. Denies heartburn or belching. Denies melena. Denies abdominal pain. Denies trouble swallowing liquids but has trouble at times swallowing large pills. Husband and son at bedside and her husband helps with the history. Barium swallow showed marked narrowing of the cervical esophagus and smooth narrowing of the distal esophagus with lodging of the barium tablet at both areas. +weight loss but amount not known. Reported concern for anorexia.   Past Medical History  Diagnosis Date  . Hyperlipidemia   . Hypertension   . History of tobacco abuse     Past Surgical History  Procedure Laterality Date  . Tonsillectomy and adenoidectomy    . Appendectomy    . Childbirth      x 3    Prior to Admission medications   Medication Sig Start Date End Date Taking? Authorizing Provider  albuterol (PROVENTIL) (2.5 MG/3ML) 0.083% nebulizer solution Take 3 mLs (2.5 mg total) by nebulization every 3 (three) hours as needed for wheezing or shortness of breath. 11/27/13  Yes Brandi L Ollis, NP  ALPRAZolam (XANAX) 0.25 MG tablet Take 0.25 mg by mouth daily.   Yes Historical Provider, MD  aspirin 81 MG tablet Take 81 mg by mouth  daily.   Yes Historical Provider, MD  bisoprolol-hydrochlorothiazide (ZIAC) 2.5-6.25 MG per tablet 1 tablet po daily 01/26/11  Yes Thomas Brackbill, MD  brinzolamide (AZOPT) 1 % ophthalmic suspension Place 1 drop into both eyes every morning. As directed   Yes Historical Provider, MD  feeding supplement, ENSURE COMPLETE, (ENSURE COMPLETE) LIQD Take 237 mLs by mouth 2 (two) times daily between meals. 11/27/13  Yes Brandi L Ollis, NP  meclizine (ANTIVERT) 12.5 MG tablet Take 12.5 mg by mouth 3 (three) times daily as needed for dizziness.   Yes Historical Provider, MD  ramipril (ALTACE) 10 MG capsule Take 10 mg by mouth daily.   Yes Historical Provider, MD  Travoprost, BAK Free, (TRAVATAN) 0.004 % SOLN ophthalmic solution Place 1 drop into both eyes at bedtime. As directed   Yes Historical Provider, MD  ipratropium-albuterol (DUONEB) 0.5-2.5 (3) MG/3ML SOLN Take 3 mLs by nebulization every 6 (six) hours. Patient not taking: Reported on 09/07/2014 11/27/13   Brandi L Ollis, NP  Multiple Vitamin (MULTIVITAMIN WITH MINERALS) TABS tablet Take 1 tablet by mouth daily. Patient not taking: Reported on 09/07/2014 11/27/13   Brandi L Ollis, NP  predniSONE (DELTASONE) 10 MG tablet 2 tabs daily for 4 days, then 1 tab daily for 4 days, then stop Patient not taking: Reported on 09/07/2014 11/27/13   Brandi L Ollis, NP    Scheduled Meds: . amLODipine  5 mg Oral Daily  . antiseptic oral rinse  7 mL Mouth Rinse q12n4p  . aspirin    81 mg Oral Daily  . brinzolamide  1 drop Both Eyes Daily  . chlordiazePOXIDE  5 mg Oral Daily  . chlorhexidine  15 mL Mouth Rinse BID  . feeding supplement (ENSURE COMPLETE)  237 mL Oral TID BM  . latanoprost  1 drop Both Eyes QHS  . multivitamin with minerals  1 tablet Oral Daily  . nicotine  14 mg Transdermal Q24H  . sodium chloride  3 mL Intravenous Q12H  . thiamine  100 mg Oral Daily   Continuous Infusions:  PRN Meds:.sodium chloride, albuterol, hydrALAZINE, loperamide, sodium  chloride  Allergies as of 09/06/2014 - Review Complete 11/21/2013  Allergen Reaction Noted  . Codeine  02/14/2012    History reviewed. No pertinent family history.  History   Social History  . Marital Status: Married    Spouse Name: N/A    Number of Children: N/A  . Years of Education: N/A   Occupational History  . Not on file.   Social History Main Topics  . Smoking status: Current Every Day Smoker -- 0.25 packs/day    Types: Cigarettes  . Smokeless tobacco: Not on file  . Alcohol Use: No  . Drug Use: Not on file  . Sexual Activity: Not on file   Other Topics Concern  . Not on file   Social History Narrative    Review of Systems: All negative from GI standpoint except as stated above in HPI.  Physical Exam: Vital signs: Filed Vitals:   09/16/14 1458  BP: 123/70  Pulse: 106  Temp: 98.8  Resp: 18   Last BM Date: 09/13/14 General:   Cachetic, elderly, Alert, no acute distress HEENT: anicteric Lungs:  Clear throughout to auscultation.   No wheezes, crackles, or rhonchi. No acute distress. Heart:  Regular rate and rhythm; no murmurs, clicks, rubs,  or gallops. Abdomen: soft, nontender, nondistended, +BS  Rectal:  Deferred Ext: nodular lesion seen on right anterior thigh  GI:  Lab Results:  Recent Labs  09/14/14 0636 09/15/14 0447 09/16/14 0508  WBC 8.6 7.6 8.2  HGB 13.9 13.5 14.0  HCT 43.9 43.6 44.2  PLT 202 204 223   BMET  Recent Labs  09/14/14 0636 09/16/14 0508  NA 132* 132*  K 4.6 4.8  CL 88* 86*  CO2 39* 39*  GLUCOSE 93 92  BUN <5* 6  CREATININE 0.46* 0.50  CALCIUM 8.9 9.4   LFT  Recent Labs  09/14/14 0636  PROT 5.8*  ALBUMIN 2.9*  AST 24  ALT 14  ALKPHOS 83  BILITOT 0.4   PT/INR No results for input(s): LABPROT, INR in the last 72 hours.   Studies/Results: Ct Head Wo Contrast  09/15/2014   CLINICAL DATA:  Intracranial hemorrhage.  Follow-up examination.  EXAM: CT HEAD WITHOUT CONTRAST  TECHNIQUE: Contiguous axial  images were obtained from the base of the skull through the vertex without intravenous contrast.  COMPARISON:  Prior CT from 09/07/2014.  FINDINGS: Previously seen intraventricular hemorrhage involving the lateral ventricles has resolved as compared to prior exam. No definite hemorrhage now identified. Overall ventricular size is slightly increased in size as compared to prior study without frank hydrocephalus. No midline shift. Basilar cisterns remain patent.  No acute large vessel territory infarct. No new intracranial hemorrhage. No extra-axial fluid collection. Chronic small vessel ischemic changes again noted low involving the periventricular and deep white matter.  Scalp soft tissues within normal limits. Forehead contusion has resolved. No acute abnormality seen about the orbits.  Calvarium intact.    Paranasal sinuses and mastoid air cells well pneumatized and free of fluid.  IMPRESSION: 1. Interval resolution of previously seen intraventricular hemorrhage. No new intracranial hemorrhage. 2. Slight interval increase in ventricular size as compared to prior study without frank hydrocephalus. 3. Moderate chronic small vessel ischemic disease, stable.   Electronically Signed   By: Benjamin  McClintock M.D.   On: 09/15/2014 22:55   Dg Esophagus  09/15/2014   CLINICAL DATA:  80-year-old female with difficulty swallowing pills. Remote history of esophageal dilation. Initial encounter.  EXAM: ESOPHOGRAM/BARIUM SWALLOW  TECHNIQUE: Single contrast examination was performed using thin barium and barium pill.  FLUOROSCOPY TIME:  54 seconds.  COMPARISON:  None.  FINDINGS: No aspiration or laryngeal penetration.  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 When the patient is ingested a 13 mm barium tablet, this becomes lodged at this level and only clears with subsequent swelling.  Impression upon the upper thoracic esophagus by calcified tortuous aorta.  Smooth narrowing of the  distal esophagus spanning over 6 cm. 13 mm barium tablet becomes lodged at this level and only clears after it was partially dissolved.  Mild presbyesophagus.  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 When the patient is ingested a 13 mm barium tablet, this becomes lodged at this level and only clears with subsequent swelling.  Smooth narrowing of the distal esophagus spanning over 6 cm. 13 mm barium tablet becomes lodged at this level and only clears after it was partially dissolved.  Mild presbyesophagus.   Electronically Signed   By: Steve  Olson M.D.   On: 09/15/2014 16:02    Impression/Plan:  80 yo with recurrent intermittent solid food dysphagia with an abnormal barium swallow as above showing a cervical esophageal narrowing as well as a distal esophageal stricture. Despite the concern for possible psychiatric source for her chewing and spitting routine her stricture could be causing dysphagia leading her to not want to swallow and an EGD with possible dilation is needed. Risks/benefits of an EGD with dilation discussed with her and her son and husband and they are agreeable for her to proceed. Will change to clear liquids tomorrow, NPO p MN on Friday night for the procedure morning of 09/18/14. Start Protonix 40 mg IV Q 24 hours.   LOS: 10 days   Abdimalik Mayorquin C.  09/16/2014, 3:02 PM   

## 2014-09-16 NOTE — Progress Notes (Signed)
[facility] PROGRESS NOTE  Laurie Horne ZOX:[id number] DOB: 10-19-1933 DOA: 09/06/2014  Brief HPI: 80 year old WF PMHx HTN, everyday smoker, Frequent Falls, Medication misuse, and presumed COPD.  She was admitted to [facility] in 10/2013 after falls with acute respiratory failure. She was intubated in ICU and eventually discharged to SNF. Family reports that she has been anorexic for about the past 5 years and barely eats. She chews her food and spits it out. Watching her weight has been very important to her. Weight in ED is 87lbs. Husband states that she has not been herself for some time. Over the past several months her abilities have been reduced. She can barely walk anymore according to her husband and has had several falls over the last 7 days. Her husband states that she has struck her head hard several times while falling. He is also concerned that she has been overdosing on her prescribed medications, he is not sure if this is intentional or not. For that reason he has been assisting her with administration. 12/15 he thinks she took her meds again after he gave them to her because he found her unresponsive on the cough in the PM. She was brought to [facility] ED and was noted to be in hypercarbic respiratory failure so she was placed on BiPAP. CT of the head was performed, which showed acute intracranial hemorrhage. The patient was ultimately stabilized and transferred out of the ICU. The patient was evaluated by Psychiatry who has recommended transfer to inpatient psychiatry, with a specific goal for transfer to Geropsychiatry at [facility]. Disposition is currently underway. In meantime her history also suggested dysphagia and so she underwent a barium swallow. Gi was then consulted.  Assessment/Plan:  Dysphagia Patient mentioned that she doesn't eat appropriately because she has difficulty swallowing. However, this is a very inconsistent history. Her husband states that he has seen her swallowing  without difficulty. There is concern that she primarily has anorexia. Barium swallow was done to further evaluate and it is noted to be abnormal. Gi consulted and plan is for EGD.   Depression -Per psychiatry patient is to be admitted to Geropsychiatry at [facility] in [place], when medically cleared. -Unclear if her drug overdose was intentional or not. Cognitive impairment and may have contributed. -Psyche to re-evaluate to see if she really needs to go to psyche. -Appreciate neurology input. Repeat CT head report was reviewed. Thiamine initiated.   Traumatic ICH in setting of frequent falls.  -Per neurosurgery no further diagnostic/therapeutic modalities required. -PT/OT recommends SNF  Acute metabolic encephalopathy  -Most likely multifactorial to include acute ICH vs 2nd to ICH, UTI -Now appears back to baseline  Acute on chronic hypercarbic respiratory failure 2/2 presumed overdose  -Resolved; patient states does not use home O2  -Hx COPD without evidence of acute exacerbation -PRN BDs  Sinus bradycardia -Most likely secondary to beta blocker overdose; resolved  Hypotension  -Resolved  HTN -Stable,  -For now continue with amlodipine 5 mg daily -Hydralazine PRN to maintain SBP<170  Hyponatremia Stable. Follow lytes  Protein calorie malnutrition -Diet regular with ensure between meals  Nicotine abuse -Continue nicotine patch  Nodular Lesions on Right Leg Unclear pathology but could be sebaceous cysts. Will need dermatology evaluation as OP.  DVT prophylaxis: SCDs Code Status: Full code Family Communication: Pt in room, husband at bedside Disposition Plan: Await EGD.   Consultants:  Psychiatry  Critical Care  Neurology  [facility]   Subjective: Inconsistent with her history. Denies any  pain.Denies any complaints. She wants to go home.   Objective: Filed Vitals:   09/15/14 2207 09/16/14 0200 09/16/14 0700 09/16/14 0928  BP: 147/71 146/78  140/80 130/79  Pulse: 80 80 86 83  Temp: 98.1 F (36.7 C) 98 F (36.7 C) 98.6 F (37 C) 98.1 F (36.7 C)  TempSrc: Oral Oral Oral Oral  Resp: 20 20 20 18   Height:      Weight:   41.867 kg (92 lb 4.8 oz)   SpO2: 100% 98% 98% 99%    Intake/Output Summary (Last 24 hours) at 09/16/14 1015 Last data filed at 09/16/14 0915  Gross per 24 hour  Intake    183 ml  Output   1203 ml  Net  -1020 ml   Filed Weights   09/14/14 0500 09/15/14 0609 09/16/14 0700  Weight: 42.502 kg (93 lb 11.2 oz) 40.098 kg (88 lb 6.4 oz) 41.867 kg (92 lb 4.8 oz)    Exam:   General:  Awake, in nad  Cardiovascular: regular, s1, s2  Respiratory: Clear to auscultation bilaterally.  Abdomen: Soft, nontender, nondistended. Bowel sounds present.  Right LE: 2 nodular lesions in right leg. One over lower thigh anteriorly and the other is over lower leg. Pitting in center. No drainage.   Data Reviewed: Basic Metabolic Panel:  Recent Labs Lab 09/11/14 0447 09/11/14 1345 09/12/14 0318 09/13/14 0545 09/14/14 0636 09/15/14 0447 09/16/14 0508  NA 129*  --  135* 135* 132*  --  132*  K 5.2 4.0 4.2 4.4 4.6  --  4.8  CL 90*  --  91* 92* 88*  --  86*  CO2 32  --  34* 37* 39*  --  39*  GLUCOSE 94  --  97 94 93  --  92  BUN 12  --  14 10 <5*  --  6  CREATININE 0.37*  --  0.37* 0.35* 0.46*  --  0.50  CALCIUM 9.0  --  9.1 9.4 8.9  --  9.4  MG 2.2  --  2.2 2.1 2.0 2.0  --    Liver Function Tests:  Recent Labs Lab 09/10/14 1010 09/11/14 0447 09/12/14 0318 09/13/14 0545 09/14/14 0636  AST 21 15 18 18 24   ALT 11 9 12 12 14   ALKPHOS 98 96 89 89 83  BILITOT 0.7 0.4 0.2* 0.3 0.4  PROT 6.6 5.7* 6.0 5.8* 5.8*  ALBUMIN 3.2* 2.7* 2.9* 2.9* 2.9*   CBC:  Recent Labs Lab 09/11/14 0447 09/12/14 0318 09/13/14 0545 09/14/14 0636 09/15/14 0447 09/16/14 0508  WBC 10.9* 8.9 8.1 8.6 7.6 8.2  NEUTROABS 7.7 6.2 5.8 5.8 5.0  --   HGB 14.5 14.6 13.7 13.9 13.5 14.0  HCT 44.1 46.9* 45.1 43.9 43.6 44.2  MCV  88.9 94.4 92.8 91.6 91.8 91.1  PLT 154 161 169 202 204 223     Recent Results (from the past 240 hour(s))  MRSA PCR Screening     Status: None   Collection Time: 09/07/14  5:55 AM  Result Value Ref Range Status   MRSA by PCR NEGATIVE NEGATIVE Final    Comment:        The GeneXpert MRSA Assay (FDA approved for NASAL specimens only), is one component of a comprehensive MRSA colonization surveillance program. It is not intended to diagnose MRSA infection nor to guide or monitor treatment for MRSA infections.   Culture, blood (routine x 2)     Status: None   Collection Time: 09/07/14  7:40 AM  Result Value Ref Range Status   Specimen Description BLOOD RIGHT WRIST  Final   Special Requests BOTTLES DRAWN AEROBIC ONLY 3CC  Final   Culture  Setup Time   Final    09/07/2014 13:12 Performed at [facility]    Culture   Final    NO GROWTH 5 DAYS Performed at [facility]    Report Status 09/13/2014 FINAL  Final  Culture, blood (routine x 2)     Status: None   Collection Time: 09/07/14  7:54 AM  Result Value Ref Range Status   Specimen Description BLOOD RIGHT HAND  Final   Special Requests BOTTLES DRAWN AEROBIC ONLY 2.5CC  Final   Culture  Setup Time   Final    09/07/2014 13:12 Performed at [facility]    Culture   Final    NO GROWTH 5 DAYS Note: Culture results may be compromised due to an inadequate volume of blood received in culture bottles. Performed at [facility]    Report Status 09/13/2014 FINAL  Final  Culture, Urine     Status: None   Collection Time: 09/10/14 11:45 AM  Result Value Ref Range Status   Specimen Description URINE, RANDOM  Final   Special Requests NONE  Final   Culture  Setup Time   Final    09/10/2014 21:24 Performed at [facility]    Colony Count   Final    75,000 COLONIES/ML Performed at [facility]    Culture   Final    Multiple bacterial morphotypes present, none predominant. Suggest  appropriate recollection if clinically indicated. Performed at [facility]    Report Status 09/11/2014 FINAL  Final  Clostridium Difficile by PCR     Status: None   Collection Time: 09/11/14  2:00 PM  Result Value Ref Range Status   C difficile by pcr NEGATIVE NEGATIVE Final     Studies: Ct Head Wo Contrast  09/15/2014   CLINICAL DATA:  Intracranial hemorrhage.  Follow-up examination.  EXAM: CT HEAD WITHOUT CONTRAST  TECHNIQUE: Contiguous axial images were obtained from the base of the skull through the vertex without intravenous contrast.  COMPARISON:  Prior CT from 09/07/2014.  FINDINGS: Previously seen intraventricular hemorrhage involving the lateral ventricles has resolved as compared to prior exam. No definite hemorrhage now identified. Overall ventricular size is slightly increased in size as compared to prior study without frank hydrocephalus. No midline shift. Basilar cisterns remain patent.  No acute large vessel territory infarct. No new intracranial hemorrhage. No extra-axial fluid collection. Chronic small vessel ischemic changes again noted low involving the periventricular and deep white matter.  Scalp soft tissues within normal limits. Forehead contusion has resolved. No acute abnormality seen about the orbits.  Calvarium intact.  Paranasal sinuses and mastoid air cells well pneumatized and free of fluid.  IMPRESSION: 1. Interval resolution of previously seen intraventricular hemorrhage. No new intracranial hemorrhage. 2. Slight interval increase in ventricular size as compared to prior study without frank hydrocephalus. 3. Moderate chronic small vessel ischemic disease, stable.   Electronically Signed   By: Rise MuBenjamin  McClintock M.D.   On: 09/15/2014 22:55   Dg Esophagus  09/15/2014   CLINICAL DATA:  80 year old female with difficulty swallowing pills. Remote history of esophageal dilation. Initial encounter.  EXAM: ESOPHOGRAM/BARIUM SWALLOW  TECHNIQUE: Single contrast  examination was performed using thin barium and barium pill.  FLUOROSCOPY TIME:  54 seconds.  COMPARISON:  None.  FINDINGS: No aspiration or laryngeal  penetration.  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 When the patient is ingested a 13 mm barium tablet, this becomes lodged at this level and only clears with subsequent swelling.  Impression upon the upper thoracic esophagus by calcified tortuous aorta.  Smooth narrowing of the distal esophagus spanning over 6 cm. 13 mm barium tablet becomes lodged at this level and only clears after it was partially dissolved.  Mild presbyesophagus.  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 When the patient is ingested a 13 mm barium tablet, this becomes lodged at this level and only clears with subsequent swelling.  Smooth narrowing of the distal esophagus spanning over 6 cm. 13 mm barium tablet becomes lodged at this level and only clears after it was partially dissolved.  Mild presbyesophagus.   Electronically Signed   By: Bridgett Larsson M.D.   On: 09/15/2014 16:02    Scheduled Meds: . amLODipine  5 mg Oral Daily  . antiseptic oral rinse  7 mL Mouth Rinse q12n4p  . aspirin  81 mg Oral Daily  . brinzolamide  1 drop Both Eyes Daily  . chlordiazePOXIDE  5 mg Oral Daily  . chlorhexidine  15 mL Mouth Rinse BID  . feeding supplement (ENSURE COMPLETE)  237 mL Oral TID BM  . latanoprost  1 drop Both Eyes QHS  . multivitamin with minerals  1 tablet Oral Daily  . nicotine  14 mg Transdermal Q24H  . sodium chloride  3 mL Intravenous Q12H  . thiamine  100 mg Oral Daily   Continuous Infusions:   Active Problems:   ICH (intracerebral hemorrhage)   Anxiety   Depression   Acute on chronic respiratory failure with hypercapnia   Adverse reaction to beta-blocker   Altered awareness, transient   Sinus bradycardia   Other specified hypotension   Essential hypertension   Hyponatremia    Protein-calorie malnutrition   Nicotine abuse   Intracranial bleeding   Suicide attempt   Traumatic intracranial hemorrhage   Metabolic encephalopathy   Hyperkalemia   Anxiety state   Skin lesion of right lower extremity   Bradycardia, sinus   Diarrhea  Time spent:  [facility]  [facility] Pager [phone].   If 7PM-7AM, please contact night-coverage at [email or web address], password [facility] 09/16/2014, 10:15 AM  LOS: 10 days

## 2014-09-16 NOTE — Consult Note (Addendum)
[facility] Face-to-Face Psychiatry Consult   Reason for Consult: Re-evaluation  Referring Physician:  Dr. Sinda Du Marriott is an 80 y.o. female. Total Time spent with patient: 30 minutes   Plan:  No evidence of imminent risk to self or others at present.    Subjective:   Patient reports feeling "OK". She  Denies depression. She denies sadness. She is not endorsing medication side effects. She states her main issue is " my bad memory". Objective :  Please refer to prior psychiatric consultation notes . At this time patient presents alert, attentive, with no evidence of delirium/confusion.  She denies depression, denies anhedonia- in fact, states she was enjoying a word finding puzzle before this session-, and presented with a full range of affect during the session, smiling and even laughing appropriately at times. She denies any suicidal ideations, and is future oriented, stating that she is hoping she can go to [employer] where she had been for a period of time earlier this year. Of note, poor appetite and significant weight loss have been a concern. As per chart- Barium Swallow demonstrated esophageal stricture that may be playing a role in the above.  Patient does not think that her dysphagia/weight loss is related to sadness or depression. Patient has regained several pounds since admission and is currently at 92 lbs . I have also, with patient's consent, spoken with her husband on the phone. He corroborates that she is " getting better every day". He states patient has no known psychiatric history / no history of mood disorder/depression. * I have discussed recommendations as below with Dr. [place] Pink   Past Psychiatric History:- patient denies. No history of suicidal attempts or severe depressive episodes. Worsening Memory - particularly short term memory has been noted by patient and husband over the last several months to year. Past Medical History  Diagnosis  Date  . Hyperlipidemia   . Hypertension   . History of tobacco abuse    Allergies:   Allergies  Allergen Reactions  . Codeine Other (See Comments)    unknown    Objective: Blood pressure 123/70, pulse 106, temperature 98.8 F (37.1 C), temperature source Oral, resp. rate 18, height $RemoveBe'5\' 5"'EAzGkbfDu$  (1.651 m), weight 41.867 kg (92 lb 4.8 oz), SpO2 91 %.Body mass index is 15.36 kg/(m^2). Results for orders placed or performed during the hospital encounter of 09/06/14 (from the past 72 hour(s))  Comprehensive metabolic panel     Status: Abnormal   Collection Time: 09/14/14  6:36 AM  Result Value Ref Range   Sodium 132 (L) 135 - 145 mmol/L    Comment: Please note change in reference range.   Potassium 4.6 3.5 - 5.1 mmol/L    Comment: Please note change in reference range.   Chloride 88 (L) 96 - 112 mEq/L   CO2 39 (H) 19 - 32 mmol/L   Glucose, Bld 93 70 - 99 mg/dL   BUN <5 (L) 6 - 23 mg/dL    Comment: REPEATED TO VERIFY   Creatinine, Ser 0.46 (L) 0.50 - 1.10 mg/dL   Calcium 8.9 8.4 - 10.5 mg/dL   Total Protein 5.8 (L) 6.0 - 8.3 g/dL   Albumin 2.9 (L) 3.5 - 5.2 g/dL   AST 24 0 - 37 U/L   ALT 14 0 - 35 U/L   Alkaline Phosphatase 83 39 - 117 U/L   Total Bilirubin 0.4 0.3 - 1.2 mg/dL   GFR calc non Af Amer >90 >90 mL/min  GFR calc Af Amer >90 >90 mL/min    Comment: (NOTE) The eGFR has been calculated using the CKD EPI equation. This calculation has not been validated in all clinical situations. eGFR's persistently <90 mL/min signify possible Chronic Kidney Disease.    Anion gap 5 5 - 15  CBC with Differential     Status: None   Collection Time: 09/14/14  6:36 AM  Result Value Ref Range   WBC 8.6 4.0 - 10.5 K/uL   RBC 4.79 3.87 - 5.11 MIL/uL   Hemoglobin 13.9 12.0 - 15.0 g/dL   HCT 43.9 36.0 - 46.0 %   MCV 91.6 78.0 - 100.0 fL   MCH 29.0 26.0 - 34.0 pg   MCHC 31.7 30.0 - 36.0 g/dL   RDW 13.0 11.5 - 15.5 %   Platelets 202 150 - 400 K/uL   Neutrophils Relative % 68 43 - 77 %    Neutro Abs 5.8 1.7 - 7.7 K/uL   Lymphocytes Relative 21 12 - 46 %   Lymphs Abs 1.8 0.7 - 4.0 K/uL   Monocytes Relative 10 3 - 12 %   Monocytes Absolute 0.9 0.1 - 1.0 K/uL   Eosinophils Relative 1 0 - 5 %   Eosinophils Absolute 0.1 0.0 - 0.7 K/uL   Basophils Relative 0 0 - 1 %   Basophils Absolute 0.0 0.0 - 0.1 K/uL  Magnesium     Status: None   Collection Time: 09/14/14  6:36 AM  Result Value Ref Range   Magnesium 2.0 1.5 - 2.5 mg/dL  CBC with Differential     Status: None   Collection Time: 09/15/14  4:47 AM  Result Value Ref Range   WBC 7.6 4.0 - 10.5 K/uL   RBC 4.75 3.87 - 5.11 MIL/uL   Hemoglobin 13.5 12.0 - 15.0 g/dL   HCT 43.6 36.0 - 46.0 %   MCV 91.8 78.0 - 100.0 fL   MCH 28.4 26.0 - 34.0 pg   MCHC 31.0 30.0 - 36.0 g/dL   RDW 13.0 11.5 - 15.5 %   Platelets 204 150 - 400 K/uL   Neutrophils Relative % 66 43 - 77 %   Neutro Abs 5.0 1.7 - 7.7 K/uL   Lymphocytes Relative 20 12 - 46 %   Lymphs Abs 1.5 0.7 - 4.0 K/uL   Monocytes Relative 11 3 - 12 %   Monocytes Absolute 0.9 0.1 - 1.0 K/uL   Eosinophils Relative 2 0 - 5 %   Eosinophils Absolute 0.2 0.0 - 0.7 K/uL   Basophils Relative 1 0 - 1 %   Basophils Absolute 0.1 0.0 - 0.1 K/uL  Magnesium     Status: None   Collection Time: 09/15/14  4:47 AM  Result Value Ref Range   Magnesium 2.0 1.5 - 2.5 mg/dL  Vitamin B12     Status: None   Collection Time: 09/15/14  3:41 PM  Result Value Ref Range   Vitamin B-12 858 211 - 911 pg/mL    Comment: Performed at [facility]  Hemoglobin A1c     Status: Abnormal   Collection Time: 09/15/14  6:55 PM  Result Value Ref Range   Hgb A1c MFr Bld 5.9 (H) <5.7 %    Comment: (NOTE)  According to the ADA Clinical Practice Recommendations for 2011, when HbA1c is used as a screening test:  >=6.5%   Diagnostic of Diabetes Mellitus           (if abnormal result is confirmed) 5.7-6.4%   Increased risk of developing  Diabetes Mellitus References:Diagnosis and Classification of Diabetes Mellitus,Diabetes [id number],3009,23(RAQTM 1):S62-S69 and Standards of Medical Care in         Diabetes - 2011,Diabetes AUQJ,3354,56 (Suppl 1):S11-S61.    Mean Plasma Glucose 123 (H) <117 mg/dL    Comment: Performed at [facility]  CBC     Status: None   Collection Time: 09/16/14  5:08 AM  Result Value Ref Range   WBC 8.2 4.0 - 10.5 K/uL   RBC 4.85 3.87 - 5.11 MIL/uL   Hemoglobin 14.0 12.0 - 15.0 g/dL   HCT 44.2 36.0 - 46.0 %   MCV 91.1 78.0 - 100.0 fL   MCH 28.9 26.0 - 34.0 pg   MCHC 31.7 30.0 - 36.0 g/dL   RDW 12.9 11.5 - 15.5 %   Platelets 223 150 - 400 K/uL  Basic metabolic panel     Status: Abnormal   Collection Time: 09/16/14  5:08 AM  Result Value Ref Range   Sodium 132 (L) 135 - 145 mmol/L    Comment: Please note change in reference range.   Potassium 4.8 3.5 - 5.1 mmol/L    Comment: Please note change in reference range.   Chloride 86 (L) 96 - 112 mEq/L   CO2 39 (H) 19 - 32 mmol/L   Glucose, Bld 92 70 - 99 mg/dL   BUN 6 6 - 23 mg/dL   Creatinine, Ser 0.50 0.50 - 1.10 mg/dL   Calcium 9.4 8.4 - 10.5 mg/dL   GFR calc non Af Amer >90 >90 mL/min   GFR calc Af Amer >90 >90 mL/min    Comment: (NOTE) The eGFR has been calculated using the CKD EPI equation. This calculation has not been validated in all clinical situations. eGFR's persistently <90 mL/min signify possible Chronic Kidney Disease.    Anion gap 7 5 - 15   Labs are reviewed and are pertinent for hypochloremia and hyponatremia  Current Facility-Administered Medications  Medication Dose Route Frequency Provider Last Rate Last Dose  . 0.9 %  sodium chloride infusion  250 mL Intravenous PRN Corey Harold, NP   Stopped at 09/07/14 1404  . albuterol (PROVENTIL) (2.5 MG/3ML) 0.083% nebulizer solution 2.5 mg  2.5 mg Nebulization Q2H PRN Corey Harold, NP      . amLODipine (NORVASC) tablet 5 mg  5 mg Oral Daily Wilhelmina Mcardle, MD   5 mg at  09/16/14 0840  . antiseptic oral rinse (CPC / CETYLPYRIDINIUM CHLORIDE 0.05%) solution 7 mL  7 mL Mouth Rinse q12n4p Corey Harold, NP   7 mL at 09/16/14 1216  . aspirin chewable tablet 81 mg  81 mg Oral Daily Juanito Doom, MD   81 mg at 09/16/14 2563  . brinzolamide (AZOPT) 1 % ophthalmic suspension 1 drop  1 drop Both Eyes Daily Wilhelmina Mcardle, MD   1 drop at 09/16/14 [phone]  . chlordiazePOXIDE (LIBRIUM) capsule 5 mg  5 mg Oral Daily Allie Bossier, MD   5 mg at 09/16/14 0841  . chlorhexidine (PERIDEX) 0.12 % solution 15 mL  15 mL Mouth Rinse BID Corey Harold, NP   15 mL at 09/16/14 0840  . feeding supplement (ENSURE COMPLETE) (ENSURE COMPLETE) liquid  237 mL  237 mL Oral TID BM Kallie Locks, RD   237 mL at 09/16/14 1416  . hydrALAZINE (APRESOLINE) injection 10 mg  10 mg Intravenous Q4H PRN Allie Bossier, MD      . latanoprost (XALATAN) 0.005 % ophthalmic solution 1 drop  1 drop Both Eyes QHS Wilhelmina Mcardle, MD   1 drop at 09/15/14 2158  . loperamide (IMODIUM) capsule 2 mg  2 mg Oral PRN Allie Bossier, MD   2 mg at 09/12/14 1410  . multivitamin with minerals tablet 1 tablet  1 tablet Oral Daily Wilhelmina Mcardle, MD   1 tablet at 09/16/14 0840  . nicotine (NICODERM CQ - dosed in mg/24 hours) patch 14 mg  14 mg Transdermal Q24H Juanito Doom, MD   14 mg at 09/15/14 1733  . pantoprazole (PROTONIX) injection 40 mg  40 mg Intravenous Q24H Lear Ng, MD      . sodium chloride 0.9 % injection 3 mL  3 mL Intravenous Q12H Juanito Doom, MD   3 mL at 09/16/14 0844  . sodium chloride 0.9 % injection 3 mL  3 mL Intravenous PRN Juanito Doom, MD      . thiamine (VITAMIN B-1) tablet 100 mg  100 mg Oral Daily Roland Rack, MD   100 mg at 09/16/14 0840    Psychiatric Specialty Exam:     Blood pressure 123/70, pulse 106, temperature 98.8 F (37.1 C), temperature source Oral, resp. rate 18, height $RemoveBe'5\' 5"'EcfbQslAq$  (1.651 m), weight 41.867 kg (92 lb 4.8 oz), SpO2 91 %.Body mass index  is 15.36 kg/(m^2).  General Appearance: Fairly Groomed- in hospital garb  Eye Contact::  Good  Speech:  Normal Rate  Volume:  Normal  Mood:  Euthymic  Affect:  Appropriate  Thought Process:  Linear  Orientation:  Other:  Alert, attentive, oriented to " [facility] [address] , [address], [place]" and to " December 24th, day before Christmas"   Thought Content:  denies hallucinations, no delusions expressed, does not appear internally preoccupied  Suicidal Thoughts:  No- at this time denies any thoughts of hurting self or anyone else   Homicidal Thoughts:  No  Memory:  immediate 3/3 , 0/3 at 5 minutes, able to spell [id number] backwards and perform serial substractions without difficulty  Judgement:  Fair  Insight:  Fair  Psychomotor Activity:  Decreased  Concentration:  Good  Recall:  Poor  Fund of Knowledge:Good  Language: Good  Akathisia:  Negative  Handed:  Right  AIMS (if indicated):     Assets:  Desire for Improvement Resilience Social Support  Sleep:      Musculoskeletal: Strength & Muscle Tone: within normal limits Gait & Station: gait not examined Patient leans: N/A  Treatment Plan Summary: -At this time there are no longer any  grounds for involuntary commitment and no grounds for inpatient psychiatric admission. -No current indication for standing psychiatric medications, would consider,if appropriate, a trial with a cholinesterase inhibitor. -Patient states  is interested in going to an [facility] after discharge.    Neita Garnet 09/16/2014 3:58 PM

## 2014-09-17 MED ORDER — SODIUM CHLORIDE 0.9 % IV SOLN
INTRAVENOUS | Status: DC
  Administered 2014-09-17: 18:00:00 via INTRAVENOUS

## 2014-09-17 NOTE — Progress Notes (Signed)
[facility] PROGRESS NOTE  Laurie Horne:[id number] DOB: 09-10-1934 DOA: 09/06/2014  Brief HPI: 80 year old WF PMHx HTN, everyday smoker, Frequent Falls, Medication misuse, and presumed COPD.  She was admitted to [facility] in 10/2013 after falls with acute respiratory failure. She was intubated in ICU and eventually discharged to SNF. Family reports that she has been anorexic for about the past 5 years and barely eats. She chews her food and spits it out. Watching her weight has been very important to her. Weight in ED is 87lbs. Husband states that she has not been herself for some time. Over the past several months her abilities have been reduced. She can barely walk anymore according to her husband and has had several falls over the last 7 days. Her husband states that she has struck her head hard several times while falling. He is also concerned that she has been overdosing on her prescribed medications, he is not sure if this is intentional or not. For that reason he has been assisting her with administration. 12/15 he thinks she took her meds again after he gave them to her because he found her unresponsive on the cough in the PM. She was brought to [facility] ED and was noted to be in hypercarbic respiratory failure so she was placed on BiPAP. CT of the head was performed, which showed acute intracranial hemorrhage. The patient was ultimately stabilized and transferred out of the ICU. The patient was evaluated by Psychiatry who initially recommended transfer to inpatient psychiatry, with a specific goal for transfer to Geropsychiatry at [facility]. In meantime her history also suggested dysphagia and so she underwent a barium swallow. Gi was then consulted. She has been cleared by psychiatry now.  Assessment/Plan:  Dysphagia Patient mentioned that she doesn't eat appropriately because she has difficulty swallowing. However, this is a very inconsistent history. Her husband states that he has seen her  swallowing without difficulty. There is concern that she primarily has anorexia. Barium swallow was done to further evaluate and it is noted to be abnormal. Gi consulted and plan is for EGD 12/26.   Depression/cognitive impairment -She was reevaluated by psychiatry yesterday. She doesn't need to go to any psychiatric facilities at this time. Discussed with Dr. Jama Flavors yesterday. We'll discontinue sitter.  -Drug overdose was likely not intentional. Cognitive impairment may have contributed. -Appreciate neurology input. Repeat CT head report was reviewed. Thiamine initiated.   Traumatic ICH in setting of frequent falls.  -Per neurosurgery no further diagnostic/therapeutic modalities required. -PT/OT recommends SNF  Acute metabolic encephalopathy  -Most likely multifactorial to include acute ICH vs 2nd to ICH, UTI -Now appears back to baseline  Acute on chronic hypercarbic respiratory failure 2/2 presumed overdose  -Resolved; patient states does not use home O2  -Hx COPD without evidence of acute exacerbation -PRN BDs  Sinus bradycardia -Most likely secondary to beta blocker overdose; resolved  Hypotension  -Resolved  HTN -Stable,  -For now continue with amlodipine 5 mg daily -Hydralazine PRN to maintain SBP<170  Hyponatremia Stable. Follow lytes  Protein calorie malnutrition -Diet regular with ensure between meals  Nicotine abuse -Continue nicotine patch  Nodular Lesions on Right Leg Unclear pathology but could be sebaceous cysts. Will need dermatology evaluation as OP.  DVT prophylaxis: SCDs Code Status: Full code Family Communication: Pt in room, husband at bedside Disposition Plan: Await EGD. Will go to skilled nursing facility once medically cleared.   Consultants:  Psychiatry  Critical Care  Neurology  [facility]  Subjective: Difficult to obtain history from her due to her cognitive impairment. Denies any pain.   Objective: Filed Vitals:   09/16/14  2138 09/17/14 0219 09/17/14 0500 09/17/14 0601  BP: 148/83 121/67  147/74  Pulse: 88 81  86  Temp: 98.3 F (36.8 C) 97.6 F (36.4 C)  97.5 F (36.4 C)  TempSrc: Oral Oral  Oral  Resp: 16 16  18   Height:      Weight:   41.64 kg (91 lb 12.8 oz)   SpO2: 100% 100%  100%    Intake/Output Summary (Last 24 hours) at 09/17/14 0902 Last data filed at 09/17/14 0803  Gross per 24 hour  Intake    880 ml  Output      9 ml  Net    871 ml   Filed Weights   09/15/14 0609 09/16/14 0700 09/17/14 0500  Weight: 40.098 kg (88 lb 6.4 oz) 41.867 kg (92 lb 4.8 oz) 41.64 kg (91 lb 12.8 oz)    Exam:   General:  Awake, in nad  Cardiovascular: regular, s1, s2  Respiratory: Clear to auscultation bilaterally.  Abdomen: Soft, nontender, nondistended. Bowel sounds present.  Right LE: 2 nodular lesions in right leg. One over lower thigh anteriorly and the other is over lower leg. Pitting in center. No drainage.   Data Reviewed: Basic Metabolic Panel:  Recent Labs Lab 09/11/14 0447 09/11/14 1345 09/12/14 0318 09/13/14 0545 09/14/14 0636 09/15/14 0447 09/16/14 0508  NA 129*  --  135* 135* 132*  --  132*  K 5.2 4.0 4.2 4.4 4.6  --  4.8  CL 90*  --  91* 92* 88*  --  86*  CO2 32  --  34* 37* 39*  --  39*  GLUCOSE 94  --  97 94 93  --  92  BUN 12  --  14 10 <5*  --  6  CREATININE 0.37*  --  0.37* 0.35* 0.46*  --  0.50  CALCIUM 9.0  --  9.1 9.4 8.9  --  9.4  MG 2.2  --  2.2 2.1 2.0 2.0  --    Liver Function Tests:  Recent Labs Lab 09/10/14 1010 09/11/14 0447 09/12/14 0318 09/13/14 0545 09/14/14 0636  AST 21 15 18 18 24   ALT 11 9 12 12 14   ALKPHOS 98 96 89 89 83  BILITOT 0.7 0.4 0.2* 0.3 0.4  PROT 6.6 5.7* 6.0 5.8* 5.8*  ALBUMIN 3.2* 2.7* 2.9* 2.9* 2.9*   CBC:  Recent Labs Lab 09/11/14 0447 09/12/14 0318 09/13/14 0545 09/14/14 0636 09/15/14 0447 09/16/14 0508  WBC 10.9* 8.9 8.1 8.6 7.6 8.2  NEUTROABS 7.7 6.2 5.8 5.8 5.0  --   HGB 14.5 14.6 13.7 13.9 13.5 14.0  HCT  44.1 46.9* 45.1 43.9 43.6 44.2  MCV 88.9 94.4 92.8 91.6 91.8 91.1  PLT 154 161 169 202 204 223     Recent Results (from the past 240 hour(s))  Culture, Urine     Status: None   Collection Time: 09/10/14 11:45 AM  Result Value Ref Range Status   Specimen Description URINE, RANDOM  Final   Special Requests NONE  Final   Culture  Setup Time   Final    09/10/2014 21:24 Performed at [facility]    Colony Count   Final    75,000 COLONIES/ML Performed at [facility]    Culture   Final    Multiple bacterial morphotypes present, none predominant. Suggest appropriate  recollection if clinically indicated. Performed at [facility]    Report Status 09/11/2014 FINAL  Final  Clostridium Difficile by PCR     Status: None   Collection Time: 09/11/14  2:00 PM  Result Value Ref Range Status   C difficile by pcr NEGATIVE NEGATIVE Final     Studies: Ct Head Wo Contrast  09/15/2014   CLINICAL DATA:  Intracranial hemorrhage.  Follow-up examination.  EXAM: CT HEAD WITHOUT CONTRAST  TECHNIQUE: Contiguous axial images were obtained from the base of the skull through the vertex without intravenous contrast.  COMPARISON:  Prior CT from 09/07/2014.  FINDINGS: Previously seen intraventricular hemorrhage involving the lateral ventricles has resolved as compared to prior exam. No definite hemorrhage now identified. Overall ventricular size is slightly increased in size as compared to prior study without frank hydrocephalus. No midline shift. Basilar cisterns remain patent.  No acute large vessel territory infarct. No new intracranial hemorrhage. No extra-axial fluid collection. Chronic small vessel ischemic changes again noted low involving the periventricular and deep white matter.  Scalp soft tissues within normal limits. Forehead contusion has resolved. No acute abnormality seen about the orbits.  Calvarium intact.  Paranasal sinuses and mastoid air cells well pneumatized and free of  fluid.  IMPRESSION: 1. Interval resolution of previously seen intraventricular hemorrhage. No new intracranial hemorrhage. 2. Slight interval increase in ventricular size as compared to prior study without frank hydrocephalus. 3. Moderate chronic small vessel ischemic disease, stable.   Electronically Signed   By: Rise MuBenjamin  McClintock M.D.   On: 09/15/2014 22:55   Dg Esophagus  09/15/2014   CLINICAL DATA:  80 year old female with difficulty swallowing pills. Remote history of esophageal dilation. Initial encounter.  EXAM: ESOPHOGRAM/BARIUM SWALLOW  TECHNIQUE: Single contrast examination was performed using thin barium and barium pill.  FLUOROSCOPY TIME:  54 seconds.  COMPARISON:  None.  FINDINGS: No aspiration or laryngeal penetration.  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 When the patient is ingested a 13 mm barium tablet, this becomes lodged at this level and only clears with subsequent swelling.  Impression upon the upper thoracic esophagus by calcified tortuous aorta.  Smooth narrowing of the distal esophagus spanning over 6 cm. 13 mm barium tablet becomes lodged at this level and only clears after it was partially dissolved.  Mild presbyesophagus.  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 When the patient is ingested a 13 mm barium tablet, this becomes lodged at this level and only clears with subsequent swelling.  Smooth narrowing of the distal esophagus spanning over 6 cm. 13 mm barium tablet becomes lodged at this level and only clears after it was partially dissolved.  Mild presbyesophagus.   Electronically Signed   By: Bridgett LarssonSteve  Olson M.D.   On: 09/15/2014 16:02    Scheduled Meds: . amLODipine  5 mg Oral Daily  . antiseptic oral rinse  7 mL Mouth Rinse q12n4p  . aspirin  81 mg Oral Daily  . brinzolamide  1 drop Both Eyes Daily  . chlordiazePOXIDE  5 mg Oral Daily  . chlorhexidine  15 mL Mouth Rinse BID  .  feeding supplement (ENSURE COMPLETE)  237 mL Oral TID BM  . latanoprost  1 drop Both Eyes QHS  . multivitamin with minerals  1 tablet Oral Daily  . nicotine  14 mg Transdermal Q24H  . pantoprazole (PROTONIX) IV  40 mg Intravenous Q24H  . sodium chloride  3 mL Intravenous Q12H  .  thiamine  100 mg Oral Daily   Continuous Infusions:   Active Problems:   ICH (intracerebral hemorrhage)   Anxiety   Depression   Acute on chronic respiratory failure with hypercapnia   Adverse reaction to beta-blocker   Altered awareness, transient   Sinus bradycardia   Other specified hypotension   Essential hypertension   Hyponatremia   Protein-calorie malnutrition   Nicotine abuse   Intracranial bleeding   Suicide attempt   Traumatic intracranial hemorrhage   Metabolic encephalopathy   Hyperkalemia   Anxiety state   Skin lesion of right lower extremity   Bradycardia, sinus   Diarrhea  Time spent:  [facility]  [facility] Pager [phone].   If 7PM-7AM, please contact night-coverage at [email or web address], password [facility] 09/17/2014, 9:02 AM  LOS: 11 days

## 2014-09-17 NOTE — Progress Notes (Signed)
Patient's husband signed consent for EGD with esophageal dilation for his wife. Placed in drawer where shadow chart should be.  IVF 0.9% NSS at 20 initiated

## 2014-09-18 ENCOUNTER — Encounter (HOSPITAL_COMMUNITY)
Admission: EM | Disposition: A | Discharge status: Skilled Nursing Facility | Payer: Medicare Other | Source: Home / Self Care | Attending: Internal Medicine

## 2014-09-18 ENCOUNTER — Encounter (HOSPITAL_COMMUNITY): Payer: Self-pay

## 2014-09-18 HISTORY — PX: BALLOON DILATION: SHX5330

## 2014-09-18 HISTORY — PX: ESOPHAGOGASTRODUODENOSCOPY: SHX5428

## 2014-09-18 HISTORY — PX: SAVORY DILATION: SHX5439

## 2014-09-18 LAB — GLUCOSE, CAPILLARY: GLUCOSE-CAPILLARY: 83 mg/dL (ref 70–99)

## 2014-09-18 SURGERY — EGD (ESOPHAGOGASTRODUODENOSCOPY)
Anesthesia: Moderate Sedation

## 2014-09-18 MED ORDER — BUTAMBEN-TETRACAINE-BENZOCAINE 2-2-14 % EX AERO
INHALATION_SPRAY | CUTANEOUS | Status: DC | PRN
  Administered 2014-09-18: 2 via TOPICAL

## 2014-09-18 MED ORDER — MIDAZOLAM HCL 10 MG/2ML IJ SOLN
INTRAMUSCULAR | Status: DC | PRN
Start: 2014-09-18 — End: 2014-09-18
  Administered 2014-09-18: 2 mg via INTRAVENOUS
  Administered 2014-09-18: 1 mg via INTRAVENOUS

## 2014-09-18 MED ORDER — FENTANYL CITRATE 0.05 MG/ML IJ SOLN
INTRAMUSCULAR | Status: AC
  Filled 2014-09-18: qty 2

## 2014-09-18 MED ORDER — MIDAZOLAM HCL 5 MG/ML IJ SOLN
INTRAMUSCULAR | Status: AC
  Filled 2014-09-18: qty 1

## 2014-09-18 MED ORDER — FENTANYL CITRATE 0.05 MG/ML IJ SOLN
INTRAMUSCULAR | Status: DC | PRN
  Administered 2014-09-18: 25 ug via INTRAVENOUS

## 2014-09-18 NOTE — Progress Notes (Signed)
Patients and husband refusing to participate in bladder training. Patient c/o needing to go urinate every 30 minutes to an hour, not interested in increasing bladder capacity at this time.

## 2014-09-18 NOTE — Brief Op Note (Addendum)
Mild to moderate narrowing in the proximal esophagus (no mass seen). No stricture seen in the distal esophagus. May need modified barium swallow on 09/20/14 and if unrevealing, then would advance diet beyond full liquids and if unable to tolerate then may need repeat EGD with Savary dilation and fluoroscopy but suspect psychologic component as well to her failure to thrive. Will give clear liquids today and advance to full liquids if tolerates clear liquids. No history of choking or coughing with POs per her husband. Aspiration precautions.

## 2014-09-18 NOTE — Op Note (Signed)
[facility] [address] [place], [id number]   ENDOSCOPY PROCEDURE REPORT  PATIENT: Laurie Horne, Laurie Horne  MR#: [id number] BIRTHDATE: Apr 16, 1934 , 80  yrs. old GENDER: female ENDOSCOPIST: Charlott RakesVincent Libero Puthoff, MD REFERRED BY:  hospital team PROCEDURE DATE:  09/18/2014 PROCEDURE:  EGD, diagnostic ASA CLASS:     Class III INDICATIONS:  dysphagia. MEDICATIONS: Fentanyl 25 mcg IV and Versed 3 mg IV TOPICAL ANESTHETIC: Cetacaine Spray  DESCRIPTION OF PROCEDURE: After the risks benefits and alternatives of the procedure were thoroughly explained, informed consent was obtained.  The Pentax Gastroscope [id number] endoscope was introduced through the mouth and advanced to the second portion of the duodenum , Without limitations.  The instrument was slowly withdrawn as the mucosa was fully examined.    Unable to intubate the esophagus with a standard endoscope (9.8 mm diameter) due to resistance. A pediatric endoscope (8 mm) was then inserted without resistance and the esophagus was intubated. A mild to moderate narrowing was noted in the proximal esophagus (benign intrinsic). No stricture was seen in the mid or distal esophagus. GEJ was 42 cm from the incisors and was normal in appearance. Stomach normal in appearance. Duodenal bulb and 2nd portion of the duodenum were normal in appearance. could        Retroflexed views revealed a small hiatal hernia.     The scope was then withdrawn from the patient and the procedure completed.  COMPLICATIONS: There were no immediate complications.  ENDOSCOPIC IMPRESSION:     Mild to moderate proximal esophagus (cervical esophagus) narrowing that is benign in appearance (see above for details) Small hiatal hernia  RECOMMENDATIONS:     Modified barium swallow and if unrevealing retry POs; If fails to tolerate then may need repeat EGD with dilation   eSigned:  Charlott RakesVincent Maren Wiesen, MD 09/18/2014 2:57 PM    CC:  CPT CODES: ICD  CODES:  The ICD and CPT codes recommended by this software are interpretations from the data that the clinical staff has captured with the software.  The verification of the translation of this report to the ICD and CPT codes and modifiers is the sole responsibility of the health care institution and practicing physician where this report was generated.  [employer] will not be held responsible for the validity of the ICD and CPT codes included on this report.  AMA assumes no liability for data contained or not contained herein. CPT is a Publishing rights managerregistered trademark of the [employer].  PATIENT NAME:  Laurie Horne, Laurie Horne MR#: [id number]

## 2014-09-18 NOTE — Interval H&P Note (Signed)
History and Physical Interval Note:  09/18/2014 2:04 PM  Laurie Horne  has presented today for surgery, with the diagnosis of dysphagia  The various methods of treatment have been discussed with the patient and family. After consideration of risks, benefits and other options for treatment, the patient has consented to  Procedure(s) with comments: ESOPHAGOGASTRODUODENOSCOPY (EGD) (N/A) BALLOON DILATION (N/A) SAVORY DILATION (N/A) - no fluro needed as a surgical intervention .  The patient's history has been reviewed, patient examined, no change in status, stable for surgery.  I have reviewed the patient's chart and labs.  Questions were answered to the patient's satisfaction.     Hiilei Gerst C.

## 2014-09-18 NOTE — Progress Notes (Signed)
[facility] PROGRESS NOTE  Laurie Horne ZOX:[id number] DOB: 07-12-1934 DOA: 09/06/2014  Brief HPI: 80 year old WF PMHx HTN, everyday smoker, Frequent Falls, Medication misuse, and presumed COPD.  She was admitted to [facility] in 10/2013 after falls with acute respiratory failure. She was intubated in ICU and eventually discharged to SNF. Family reports that she has been anorexic for about the past 5 years and barely eats. She chews her food and spits it out. Watching her weight has been very important to her. Weight in ED is 87lbs. Husband states that she has not been herself for some time. Over the past several months her abilities have been reduced. She can barely walk anymore according to her husband and has had several falls over the last 7 days. Her husband states that she has struck her head hard several times while falling. He is also concerned that she has been overdosing on her prescribed medications, he is not sure if this is intentional or not. For that reason he has been assisting her with administration. 12/15 he thinks she took her meds again after he gave them to her because he found her unresponsive on the cough in the PM. She was brought to [facility] ED and was noted to be in hypercarbic respiratory failure so she was placed on BiPAP. CT of the head was performed, which showed acute intracranial hemorrhage. The patient was ultimately stabilized and transferred out of the ICU. The patient was evaluated by Psychiatry who initially recommended transfer to inpatient psychiatry, with a specific goal for transfer to Geropsychiatry at [facility]. In meantime her history also suggested dysphagia and so she underwent a barium swallow. Gi was then consulted. She has been cleared by psychiatry now.  Assessment/Plan:  Dysphagia Patient mentioned that she doesn't eat appropriately because she has difficulty swallowing. History was inconsistent. There is concern that she primarily has anorexia. Barium swallow  was done to further evaluate and it is noted to be abnormal. Gi consulted and plan is for EGD 12/26. Await EGD today.  Depression/cognitive impairment -She was reevaluated by psychiatry. She doesn't need to go to any psychiatric facilities at this time. Discussed with Dr. Jama Flavors.  -Drug overdose was likely not intentional. Cognitive impairment may have contributed. -Appreciate neurology input. Repeat CT head report was reviewed. Thiamine initiated.   Traumatic ICH in setting of frequent falls.  -Per neurosurgery no further diagnostic/therapeutic modalities required. -PT/OT recommends SNF  Acute metabolic encephalopathy  -Most likely multifactorial to include acute ICH vs 2nd to ICH, UTI -Now appears back to baseline  Acute on chronic hypercarbic respiratory failure 2/2 presumed overdose  -Resolved; patient states does not use home O2  -Hx COPD without evidence of acute exacerbation -PRN BDs  Sinus bradycardia -Most likely secondary to beta blocker overdose; resolved  Hypotension  -Resolved  HTN -Stable,  -For now continue with amlodipine 5 mg daily -Hydralazine PRN to maintain SBP<170  Hyponatremia Stable. Follow lytes  Protein calorie malnutrition -Diet regular with ensure between meals  Nicotine abuse -Continue nicotine patch  Nodular Lesions on Right Leg Per husband these have been present for at least 3 weeks but he is unsure. Unclear pathology but could be sebaceous cysts. Will need dermatology evaluation as OP.  DVT prophylaxis: SCDs Code Status: Full code Family Communication: Discussed with husband at bedside Disposition Plan: Await EGD. Will go to skilled nursing facility once medically cleared.    Consultants:  Psychiatry  Critical Care  Neurology  [facility]   Subjective: Difficult  to obtain history from her due to her cognitive impairment. Denies any pain.   Objective: Filed Vitals:   09/17/14 1739 09/17/14 2124 09/18/14 0112 09/18/14 0527   BP: 135/68 127/70 141/79 128/75  Pulse: 86 76 81 83  Temp: 98.6 F (37 C) 98.2 F (36.8 C) 98.1 F (36.7 C) 97.9 F (36.6 C)  TempSrc: Oral Oral Axillary Axillary  Resp: 20 20 20 20   Height:      Weight:    39.917 kg (88 lb)  SpO2: 98% 100% 100% 99%    Intake/Output Summary (Last 24 hours) at 09/18/14 0912 Last data filed at 09/17/14 1936  Gross per 24 hour  Intake    120 ml  Output      0 ml  Net    120 ml   Filed Weights   09/16/14 0700 09/17/14 0500 09/18/14 0527  Weight: 41.867 kg (92 lb 4.8 oz) 41.64 kg (91 lb 12.8 oz) 39.917 kg (88 lb)    Exam:   General:  Awake, in nad  Cardiovascular: regular, s1, s2  Respiratory: Clear to auscultation bilaterally.  Abdomen: Soft, nontender, nondistended. Bowel sounds present.  Right LE: 2 nodular lesions in right leg. One over lower thigh anteriorly and the other is over lower leg. Pitting in center. No drainage.   Data Reviewed: Basic Metabolic Panel:  Recent Labs Lab 09/11/14 1345 09/12/14 0318 09/13/14 0545 09/14/14 0636 09/15/14 0447 09/16/14 0508  NA  --  135* 135* 132*  --  132*  K 4.0 4.2 4.4 4.6  --  4.8  CL  --  91* 92* 88*  --  86*  CO2  --  34* 37* 39*  --  39*  GLUCOSE  --  97 94 93  --  92  BUN  --  14 10 <5*  --  6  CREATININE  --  0.37* 0.35* 0.46*  --  0.50  CALCIUM  --  9.1 9.4 8.9  --  9.4  MG  --  2.2 2.1 2.0 2.0  --    Liver Function Tests:  Recent Labs Lab 09/12/14 0318 09/13/14 0545 09/14/14 0636  AST 18 18 24   ALT 12 12 14   ALKPHOS 89 89 83  BILITOT 0.2* 0.3 0.4  PROT 6.0 5.8* 5.8*  ALBUMIN 2.9* 2.9* 2.9*   CBC:  Recent Labs Lab 09/12/14 0318 09/13/14 0545 09/14/14 0636 09/15/14 0447 09/16/14 0508  WBC 8.9 8.1 8.6 7.6 8.2  NEUTROABS 6.2 5.8 5.8 5.0  --   HGB 14.6 13.7 13.9 13.5 14.0  HCT 46.9* 45.1 43.9 43.6 44.2  MCV 94.4 92.8 91.6 91.8 91.1  PLT 161 169 202 204 223     Recent Results (from the past 240 hour(s))  Culture, Urine     Status: None    Collection Time: 09/10/14 11:45 AM  Result Value Ref Range Status   Specimen Description URINE, RANDOM  Final   Special Requests NONE  Final   Culture  Setup Time   Final    09/10/2014 21:24 Performed at [facility]    Colony Count   Final    75,000 COLONIES/ML Performed at [facility]    Culture   Final    Multiple bacterial morphotypes present, none predominant. Suggest appropriate recollection if clinically indicated. Performed at [facility]    Report Status 09/11/2014 FINAL  Final  Clostridium Difficile by PCR     Status: None   Collection Time: 09/11/14  2:00 PM  Result Value Ref Range Status   C difficile by pcr NEGATIVE NEGATIVE Final     Studies: No results found.  Scheduled Meds: . amLODipine  5 mg Oral Daily  . antiseptic oral rinse  7 mL Mouth Rinse q12n4p  . aspirin  81 mg Oral Daily  . brinzolamide  1 drop Both Eyes Daily  . chlordiazePOXIDE  5 mg Oral Daily  . chlorhexidine  15 mL Mouth Rinse BID  . feeding supplement (ENSURE COMPLETE)  237 mL Oral TID BM  . latanoprost  1 drop Both Eyes QHS  . multivitamin with minerals  1 tablet Oral Daily  . nicotine  14 mg Transdermal Q24H  . pantoprazole (PROTONIX) IV  40 mg Intravenous Q24H  . sodium chloride  3 mL Intravenous Q12H  . thiamine  100 mg Oral Daily   Continuous Infusions: . sodium chloride 20 mL/hr at 09/17/14 1810    Active Problems:   ICH (intracerebral hemorrhage)   Anxiety   Depression   Acute on chronic respiratory failure with hypercapnia   Adverse reaction to beta-blocker   Altered awareness, transient   Sinus bradycardia   Other specified hypotension   Essential hypertension   Hyponatremia   Protein-calorie malnutrition   Nicotine abuse   Intracranial bleeding   Suicide attempt   Traumatic intracranial hemorrhage   Metabolic encephalopathy   Hyperkalemia   Anxiety state   Skin lesion of right lower extremity   Bradycardia, sinus   Diarrhea  Time  spent: 30min  [facility]  [facility] Pager [phone].   If 7PM-7AM, please contact night-coverage at [email or web address], password [email or web address] 09/18/2014, 9:12 AM  LOS: 12 days

## 2014-09-18 NOTE — H&P (View-Only) (Signed)
Referring Provider: Dr. Rito EhrlichKrishnan Primary Care Physician:  No primary care provider on file. Primary Gastroenterologist:  Gentry FitzUnassigned  Reason for Consultation:  Dysphagia  HPI: Laurie Horne is a 80 y.o. female who was admitted for recurrent falls and intracranial hemorrhage being seen for a consult due to dysphagia. Reports 1-2 year history of intermittent solid food dysphagia stating that at times with swallowing food she will feel that it hangs up in her throat. Sensation occurs at various times of the meal. She also admits to frequently chewing meats especially and other solid foods in her mouth and then will spit them out and not try and swallow them. Denies coughing or choking with swallowing. Her husband states she has done the chewing routine where she spits her food out for a while. Denies heartburn or belching. Denies melena. Denies abdominal pain. Denies trouble swallowing liquids but has trouble at times swallowing large pills. Husband and son at bedside and her husband helps with the history. Barium swallow showed marked narrowing of the cervical esophagus and smooth narrowing of the distal esophagus with lodging of the barium tablet at both areas. +weight loss but amount not known. Reported concern for anorexia.   Past Medical History  Diagnosis Date  . Hyperlipidemia   . Hypertension   . History of tobacco abuse     Past Surgical History  Procedure Laterality Date  . Tonsillectomy and adenoidectomy    . Appendectomy    . Childbirth      x 3    Prior to Admission medications   Medication Sig Start Date End Date Taking? Authorizing Provider  albuterol (PROVENTIL) (2.5 MG/3ML) 0.083% nebulizer solution Take 3 mLs (2.5 mg total) by nebulization every 3 (three) hours as needed for wheezing or shortness of breath. 11/27/13  Yes Jeanella CrazeBrandi L Ollis, NP  ALPRAZolam (XANAX) 0.25 MG tablet Take 0.25 mg by mouth daily.   Yes Historical Provider, MD  aspirin 81 MG tablet Take 81 mg by mouth  daily.   Yes Historical Provider, MD  bisoprolol-hydrochlorothiazide [facility](ZIAC) 2.5-6.25 MG per tablet 1 tablet po daily 01/26/11  Yes Cassell Clementhomas Brackbill, MD  brinzolamide (AZOPT) 1 % ophthalmic suspension Place 1 drop into both eyes every morning. As directed   Yes Historical Provider, MD  feeding supplement, ENSURE COMPLETE, (ENSURE COMPLETE) LIQD Take 237 mLs by mouth 2 (two) times daily between meals. 11/27/13  Yes Jeanella CrazeBrandi L Ollis, NP  meclizine (ANTIVERT) 12.5 MG tablet Take 12.5 mg by mouth 3 (three) times daily as needed for dizziness.   Yes Historical Provider, MD  ramipril (ALTACE) 10 MG capsule Take 10 mg by mouth daily.   Yes Historical Provider, MD  Travoprost, BAK Free, (TRAVATAN) 0.004 % SOLN ophthalmic solution Place 1 drop into both eyes at bedtime. As directed   Yes Historical Provider, MD  ipratropium-albuterol (DUONEB) 0.5-2.5 (3) MG/3ML SOLN Take 3 mLs by nebulization every 6 (six) hours. Patient not taking: Reported on 09/07/2014 11/27/13   Jeanella CrazeBrandi L Ollis, NP  Multiple Vitamin (MULTIVITAMIN WITH MINERALS) TABS tablet Take 1 tablet by mouth daily. Patient not taking: Reported on 09/07/2014 11/27/13   Jeanella CrazeBrandi L Ollis, NP  predniSONE (DELTASONE) 10 MG tablet 2 tabs daily for 4 days, then 1 tab daily for 4 days, then stop Patient not taking: Reported on 09/07/2014 11/27/13   Jeanella CrazeBrandi L Ollis, NP    Scheduled Meds: . amLODipine  5 mg Oral Daily  . antiseptic oral rinse  7 mL Mouth Rinse q12n4p  . aspirin  81 mg Oral Daily  . brinzolamide  1 drop Both Eyes Daily  . chlordiazePOXIDE  5 mg Oral Daily  . chlorhexidine  15 mL Mouth Rinse BID  . feeding supplement (ENSURE COMPLETE)  237 mL Oral TID BM  . latanoprost  1 drop Both Eyes QHS  . multivitamin with minerals  1 tablet Oral Daily  . nicotine  14 mg Transdermal Q24H  . sodium chloride  3 mL Intravenous Q12H  . thiamine  100 mg Oral Daily   Continuous Infusions:  PRN Meds:.sodium chloride, albuterol, hydrALAZINE, loperamide, sodium  chloride  Allergies as of 09/06/2014 - Review Complete 11/21/2013  Allergen Reaction Noted  . Codeine  02/14/2012    History reviewed. No pertinent family history.  History   Social History  . Marital Status: Married    Spouse Name: N/A    Number of Children: N/A  . Years of Education: N/A   Occupational History  . Not on file.   Social History Main Topics  . Smoking status: Current Every Day Smoker -- 0.25 packs/day    Types: Cigarettes  . Smokeless tobacco: Not on file  . Alcohol Use: No  . Drug Use: Not on file  . Sexual Activity: Not on file   Other Topics Concern  . Not on file   Social History Narrative    Review of Systems: All negative from GI standpoint except as stated above in HPI.  Physical Exam: Vital signs: Filed Vitals:   09/16/14 1458  BP: 123/70  Pulse: 106  Temp: 98.8  Resp: 18   Last BM Date: 09/13/14 General:   Cachetic, elderly, Alert, no acute distress HEENT: anicteric Lungs:  Clear throughout to auscultation.   No wheezes, crackles, or rhonchi. No acute distress. Heart:  Regular rate and rhythm; no murmurs, clicks, rubs,  or gallops. Abdomen: soft, nontender, nondistended, +BS  Rectal:  Deferred Ext: nodular lesion seen on right anterior thigh  GI:  Lab Results:  Recent Labs  09/14/14 0636 09/15/14 0447 09/16/14 0508  WBC 8.6 7.6 8.2  HGB 13.9 13.5 14.0  HCT 43.9 43.6 44.2  PLT 202 204 223   BMET  Recent Labs  09/14/14 0636 09/16/14 0508  NA 132* 132*  K 4.6 4.8  CL 88* 86*  CO2 39* 39*  GLUCOSE 93 92  BUN <5* 6  CREATININE 0.46* 0.50  CALCIUM 8.9 9.4   LFT  Recent Labs  09/14/14 0636  PROT 5.8*  ALBUMIN 2.9*  AST 24  ALT 14  ALKPHOS 83  BILITOT 0.4   PT/INR No results for input(s): LABPROT, INR in the last 72 hours.   Studies/Results: Ct Head Wo Contrast  09/15/2014   CLINICAL DATA:  Intracranial hemorrhage.  Follow-up examination.  EXAM: CT HEAD WITHOUT CONTRAST  TECHNIQUE: Contiguous axial  images were obtained from the base of the skull through the vertex without intravenous contrast.  COMPARISON:  Prior CT from 09/07/2014.  FINDINGS: Previously seen intraventricular hemorrhage involving the lateral ventricles has resolved as compared to prior exam. No definite hemorrhage now identified. Overall ventricular size is slightly increased in size as compared to prior study without frank hydrocephalus. No midline shift. Basilar cisterns remain patent.  No acute large vessel territory infarct. No new intracranial hemorrhage. No extra-axial fluid collection. Chronic small vessel ischemic changes again noted low involving the periventricular and deep white matter.  Scalp soft tissues within normal limits. Forehead contusion has resolved. No acute abnormality seen about the orbits.  Calvarium intact.  Paranasal sinuses and mastoid air cells well pneumatized and free of fluid.  IMPRESSION: 1. Interval resolution of previously seen intraventricular hemorrhage. No new intracranial hemorrhage. 2. Slight interval increase in ventricular size as compared to prior study without frank hydrocephalus. 3. Moderate chronic small vessel ischemic disease, stable.   Electronically Signed   By: Rise Mu M.D.   On: 09/15/2014 22:55   Dg Esophagus  09/15/2014   CLINICAL DATA:  80 year old female with difficulty swallowing pills. Remote history of esophageal dilation. Initial encounter.  EXAM: ESOPHOGRAM/BARIUM SWALLOW  TECHNIQUE: Single contrast examination was performed using thin barium and barium pill.  FLUOROSCOPY TIME:  54 seconds.  COMPARISON:  None.  FINDINGS: No aspiration or laryngeal penetration.  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 When the patient is ingested a 13 mm barium tablet, this becomes lodged at this level and only clears with subsequent swelling.  Impression upon the upper thoracic esophagus by calcified tortuous aorta.  Smooth narrowing of the  distal esophagus spanning over 6 cm. 13 mm barium tablet becomes lodged at this level and only clears after it was partially dissolved.  Mild presbyesophagus.  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 When the patient is ingested a 13 mm barium tablet, this becomes lodged at this level and only clears with subsequent swelling.  Smooth narrowing of the distal esophagus spanning over 6 cm. 13 mm barium tablet becomes lodged at this level and only clears after it was partially dissolved.  Mild presbyesophagus.   Electronically Signed   By: Bridgett Larsson M.D.   On: 09/15/2014 16:02    Impression/Plan:  80 yo with recurrent intermittent solid food dysphagia with an abnormal barium swallow as above showing a cervical esophageal narrowing as well as a distal esophageal stricture. Despite the concern for possible psychiatric source for her chewing and spitting routine her stricture could be causing dysphagia leading her to not want to swallow and an EGD with possible dilation is needed. Risks/benefits of an EGD with dilation discussed with her and her son and husband and they are agreeable for her to proceed. Will change to clear liquids tomorrow, NPO p MN on Friday night for the procedure morning of 09/18/14. Start Protonix 40 mg IV Q 24 hours.   LOS: 10 days   Laurie Horne C.  09/16/2014, 3:02 PM

## 2014-09-19 LAB — BASIC METABOLIC PANEL
Anion gap: 7 (ref 5–15)
BUN: <5 mg/dL — ABNORMAL LOW (ref 6–23)
CALCIUM: 9.1 mg/dL (ref 8.4–10.5)
CO2: 33 mmol/L — ABNORMAL HIGH (ref 19–32)
Chloride: 93 mEq/L — ABNORMAL LOW (ref 96–112)
Creatinine, Ser: 0.37 mg/dL — ABNORMAL LOW (ref 0.50–1.10)
GFR calc Af Amer: >90 mL/min (ref >90)
Glucose, Bld: 94 mg/dL (ref 70–99)
Potassium: 4.7 mmol/L (ref 3.5–5.1)
SODIUM: 133 mmol/L — AB (ref 135–145)

## 2014-09-19 LAB — CBC
HCT: 42.3 % (ref 36.0–46.0)
Hemoglobin: 13.2 g/dL (ref 12.0–15.0)
MCH: 29 pg (ref 26.0–34.0)
MCHC: 31.2 g/dL (ref 30.0–36.0)
MCV: 93 fL (ref 78.0–100.0)
PLATELETS: 250 10*3/uL (ref 150–400)
RBC: 4.55 MIL/uL (ref 3.87–5.11)
RDW: 13.2 % (ref 11.5–15.5)
WBC: 14.5 10*3/uL — AB (ref 4.0–10.5)

## 2014-09-19 MED ORDER — PANTOPRAZOLE SODIUM 40 MG PO TBEC
40.0000 mg | DELAYED_RELEASE_TABLET | Freq: Every day | ORAL | Status: DC
  Administered 2014-09-20 – 2014-09-22 (×3): 40 mg via ORAL
  Filled 2014-09-19 (×3): qty 1

## 2014-09-19 NOTE — Progress Notes (Signed)
[facility] PROGRESS NOTE  Laurie ChengJoan C Horne XLK:[id number] DOB: 03/20/1934 DOA: 09/06/2014 PCP: No primary care provider on file.  Assessment/Plan: Dysphagia Patient mentioned that she doesn't eat appropriately because she has difficulty swallowing. History was inconsistent. There is concern that she primarily has anorexia. Barium swallow was done to further evaluate and it is noted to be abnormal. Gi consulted and pt is now s/p EGD 12/26 with findings of mild-moderate prox esophagus narrowing that is benign in appearance. Recs to advance to full liquid diet today, barium study tomorrow, and if normal study to advance diet further  Depression/cognitive impairment -She was re-evaluated by psychiatry. She doesn't need to go to any psychiatric facilities at this time. -Drug overdose was likely not intentional. Cognitive impairment may have contributed. -Appreciate neurology input. Repeat CT head report was reviewed. Thiamine initiated.   Traumatic ICH in setting of frequent falls.  -Per neurosurgery no further diagnostic/therapeutic modalities required. -PT/OT recommends SNF  Acute metabolic encephalopathy  -Most likely multifactorial to include acute ICH vs 2nd to ICH, UTI -Now appears back to baseline  Acute on chronic hypercarbic respiratory failure 2/2 presumed overdose  -Resolved; patient states does not use home O2  -Hx COPD without evidence of acute exacerbation -PRN BDs  Sinus bradycardia -Most likely secondary to beta blocker overdose; resolved  Hypotension  -Resolved  HTN -Stable,  -For now continue with amlodipine 5 mg daily -Hydralazine PRN to maintain SBP<170  Hyponatremia Stable. Follow lytes  Protein calorie malnutrition -Diet regular with ensure between meals  Nicotine abuse -Continue nicotine patch  Nodular Lesions on Right Leg Per husband these have been present for at least 3 weeks but he is unsure. Unclear pathology but could be sebaceous  cysts. Will need dermatology evaluation as OP.  Code Status: DNR Family Communication: Pt in room, family at bedside (indicate person spoken with, relationship, and if by phone, the number) Disposition Plan: pending   Consultants:  GI  Psychiatry  Neurology  Critical care  Procedures:    Antibiotics:    HPI/Subjective: Eager to go home. No acute events noted overnight  Objective: Filed Vitals:   09/19/14 0309 09/19/14 0600 09/19/14 0929 09/19/14 1415  BP: 129/80 134/75 137/69 122/57  Pulse: 89 83 83 87  Temp: 98.5 F (36.9 C) 98.1 F (36.7 C) 98.4 F (36.9 C) 98.7 F (37.1 C)  TempSrc: Oral Oral Oral Oral  Resp: 18 18 20 20   Height:      Weight:  39.055 kg (86 lb 1.6 oz)    SpO2: 95% 98% 98% 99%   No intake or output data in the 24 hours ending 09/19/14 1508 Filed Weights   09/17/14 0500 09/18/14 0527 09/19/14 0600  Weight: 41.64 kg (91 lb 12.8 oz) 39.917 kg (88 lb) 39.055 kg (86 lb 1.6 oz)    Exam:   General:  Awake, in nad  Cardiovascular: regular, s1, s2  Respiratory: normal resp effort, no wheezing  Abdomen: soft,nondistended  Musculoskeletal: perfused, no clubbing   Data Reviewed: Basic Metabolic Panel:  Recent Labs Lab 09/13/14 0545 09/14/14 0636 09/15/14 0447 09/16/14 0508 09/19/14 0509  NA 135* 132*  --  132* 133*  K 4.4 4.6  --  4.8 4.7  CL 92* 88*  --  86* 93*  CO2 37* 39*  --  39* 33*  GLUCOSE 94 93  --  92 94  BUN 10 <5*  --  6 <5*  CREATININE 0.35* 0.46*  --  0.50 0.37*  CALCIUM 9.4 8.9  --  9.4 9.1  MG 2.1 2.0 2.0  --   --    Liver Function Tests:  Recent Labs Lab 09/13/14 0545 09/14/14 0636  AST 18 24  ALT 12 14  ALKPHOS 89 83  BILITOT 0.3 0.4  PROT 5.8* 5.8*  ALBUMIN 2.9* 2.9*   No results for input(s): LIPASE, AMYLASE in the last 168 hours. No results for input(s): AMMONIA in the last 168 hours. CBC:  Recent Labs Lab 09/13/14 0545 09/14/14 0636 09/15/14 0447 09/16/14 0508 09/19/14 0509  WBC 8.1  8.6 7.6 8.2 14.5*  NEUTROABS 5.8 5.8 5.0  --   --   HGB 13.7 13.9 13.5 14.0 13.2  HCT 45.1 43.9 43.6 44.2 42.3  MCV 92.8 91.6 91.8 91.1 93.0  PLT 169 202 204 223 250   Cardiac Enzymes: No results for input(s): CKTOTAL, CKMB, CKMBINDEX, TROPONINI in the last 168 hours. BNP (last 3 results)  Recent Labs  11/21/13 1550  PROBNP 16993.0*   CBG:  Recent Labs Lab 09/18/14 0640  GLUCAP 83    Recent Results (from the past 240 hour(s))  Culture, Urine     Status: None   Collection Time: 09/10/14 11:45 AM  Result Value Ref Range Status   Specimen Description URINE, RANDOM  Final   Special Requests NONE  Final   Culture  Setup Time   Final    09/10/2014 21:24 Performed at [facility]    Colony Count   Final    75,000 COLONIES/ML Performed at [facility]    Culture   Final    Multiple bacterial morphotypes present, none predominant. Suggest appropriate recollection if clinically indicated. Performed at [facility]    Report Status 09/11/2014 FINAL  Final  Clostridium Difficile by PCR     Status: None   Collection Time: 09/11/14  2:00 PM  Result Value Ref Range Status   C difficile by pcr NEGATIVE NEGATIVE Final     Studies: No results found.  Scheduled Meds: . amLODipine  5 mg Oral Daily  . antiseptic oral rinse  7 mL Mouth Rinse q12n4p  . aspirin  81 mg Oral Daily  . brinzolamide  1 drop Both Eyes Daily  . chlordiazePOXIDE  5 mg Oral Daily  . chlorhexidine  15 mL Mouth Rinse BID  . feeding supplement (ENSURE COMPLETE)  237 mL Oral TID BM  . latanoprost  1 drop Both Eyes QHS  . multivitamin with minerals  1 tablet Oral Daily  . nicotine  14 mg Transdermal Q24H  . pantoprazole  40 mg Oral QHS  . sodium chloride  3 mL Intravenous Q12H  . thiamine  100 mg Oral Daily   Continuous Infusions:   Principal Problem:   Dysphagia Active Problems:   ICH (intracerebral hemorrhage)   Anxiety   Depression   Acute on chronic respiratory failure  with hypercapnia   Adverse reaction to beta-blocker   Altered awareness, transient   Sinus bradycardia   Other specified hypotension   Essential hypertension   Hyponatremia   Protein-calorie malnutrition   Nicotine abuse   Intracranial bleeding   Suicide attempt   Traumatic intracranial hemorrhage   Metabolic encephalopathy   Hyperkalemia   Anxiety state   Skin lesion of right lower extremity   Bradycardia, sinus   Diarrhea  Time spent: 25min  CHIU, STEPHEN K  [facility] Pager [phone]. If 7PM-7AM, please contact night-coverage at [email or web address], password [email or web address] 09/19/2014, 3:08 PM  LOS: 13 days

## 2014-09-19 NOTE — Progress Notes (Signed)
Patient ID: Laurie Horne, female   DOB: 08/22/1934, 80 y.o.   MRN: [id number] [facility]  Laurie Horne 80 y.o. 09/02/1934   Subjective: Resting comfortably. Easily arousable. Tolerating liquids.  Objective: Vital signs: Filed Vitals:   09/19/14 1415  BP: 122/57  Pulse: 87  Temp: 98.7 F (37.1 C)  Resp: 20    Physical Exam: Gen: no acute distress  Lab Results:  Recent Labs  09/19/14 0509  NA 133*  K 4.7  CL 93*  CO2 33*  GLUCOSE 94  BUN <5*  CREATININE 0.37*  CALCIUM 9.1   No results for input(s): AST, ALT, ALKPHOS, BILITOT, PROT, ALBUMIN in the last 72 hours.  Recent Labs  09/19/14 0509  WBC 14.5*  HGB 13.2  HCT 42.3  MCV 93.0  PLT 250      Assessment/Plan: Failure to thrive - multifactorial. Proximal esophageal narrowing contributing but also suspect a psychological component (see chart). NPO p MN. Modified barium swallow tomorrow and if ok then advance diet further. If does not tolerate advancing diet, then may need repeat EGD with dilation using fluoroscopy.   Klarissa Mcilvain C. 09/19/2014, 2:38 PM

## 2014-09-20 ENCOUNTER — Encounter (HOSPITAL_COMMUNITY): Payer: Self-pay | Admitting: Gastroenterology

## 2014-09-20 ENCOUNTER — Inpatient Hospital Stay (HOSPITAL_COMMUNITY): Payer: Medicare Other

## 2014-09-20 LAB — VITAMIN B1: Vitamin B1 (Thiamine): 31 nmol/L — ABNORMAL HIGH (ref 8–30)

## 2014-09-20 NOTE — Progress Notes (Signed)
[facility] PROGRESS NOTE Subjective patient today claims that she is not having any dysphagia. She is being evaluated by speech pathology for neuromuscular problem potentially contributing. She apparently has been doing reasonably well with liquids.  Objective: Vital signs in last 24 hours: Temp:  [97.7 F (36.5 C)-98.7 F (37.1 C)] 97.7 F (36.5 C) (12/28 0907) Pulse Rate:  [75-88] 75 (12/28 0907) Resp:  [19-20] 19 (12/28 0907) BP: (122-149)/(57-84) 149/84 mmHg (12/28 0907) SpO2:  [95 %-100 %] 96 % (12/28 0907) Weight:  [36.56 kg (80 lb 9.6 oz)] 36.56 kg (80 lb 9.6 oz) (12/28 0512) Last BM Date: 09/18/14  Intake/Output from previous day: 12/27 0701 - 12/28 0700 In: 240 [P.O.:240] Out: -  Intake/Output this shift:      Lab Results:  Recent Labs  09/19/14 0509  WBC 14.5*  HGB 13.2  HCT 42.3  PLT 250   BMET  Recent Labs  09/19/14 0509  NA 133*  K 4.7  CL 93*  CO2 33*  CREATININE 0.37*   LFT No results for input(s): PROT, AST, ALT, ALKPHOS, BILITOT, BILIDIR, IBILI in the last 72 hours. PT/INR No results for input(s): LABPROT, INR in the last 72 hours. PANCREAS No results for input(s): LIPASE in the last 72 hours.       Studies/Results: No results found.  Medications: I have reviewed the patient's current medications.  Assessment/Plan: 1. Dysphagia. It is not clear how much of this is due to the proximal esophageal stricture and how much of this may be psychological or possibly due to a neuromuscular problems. We will go ahead with the formal speech pathology evaluation and allow her to remain on clear liquids now. If everything is okay will consider esophageal dilatation using fluoroscopy. Have discussed with the patient and son that there are potential risk to esophageal dilatation.   Evony Rezek JR,Sanyia Dini L 09/20/2014, 10:34 AM

## 2014-09-20 NOTE — Clinical Social Work Note (Addendum)
CSW met the pt at the bedside. CSW presented the pt with 4 bed offers (Both of the [facility], and [facility]), pt declined the 4 bed offers. Pt reported if she can not go to [facility] she will go home. CSW explained to the pt that [facility] can not offer a bed. Pt reported being upset since [facility] could not offer her a bed. Pt requested CSW to call and provided her husband with an update. CSW left a voice message for the pt's husband Jenny Reichmann regarding the bed offers. CSW will continue to follow.    Addendum: CSW met the pt and John at the bedside. The pt reported that she would like to go home, since [facility] could not offer a bed. John reported he prefers the pt to transition to SNF, but did not accept the bed offers given. CSW explained the SNF process to the pt and John several times. John continue to be reluctant to providing the CSW with alternate SNFs choices. Both the pt and Jenny Reichmann continue to place emphasis on going to [facility].  CSW explained the importance of selecting a SNF and the discharge process. John insist that he will be speaking with [facility] to better understand why the pt could not transition there.   [place], MSW, [place]

## 2014-09-20 NOTE — Progress Notes (Signed)
Physical Therapy Treatment Patient Details Name: Laurie Horne MRN: [id number] DOB: 09/30/1933 Today's Date: 09/20/2014    History of Present Illness 80 yo female s/p fall with IVH and respiratory failure. pt reports wishes to committ suicide. UTI (+) NWG:[id number]:anxiety, HTN,depression, anorexia, home oxygen      PT Comments    Patient making gains with mobility and gait.  Continues to be impulsive with decreased safety awareness.  Agree with need for SNF at discharge for continued therapy.  Follow Up Recommendations  SNF     Equipment Recommendations  None recommended by PT    Recommendations for Other Services       Precautions / Restrictions Precautions Precautions: Fall Precaution Comments: Impulsive Restrictions Weight Bearing Restrictions: No    Mobility  Bed Mobility Overal bed mobility: Modified Independent Bed Mobility: Supine to Sit;Sit to Supine     Supine to sit: Modified independent (Device/Increase time) Sit to supine: Modified independent (Device/Increase time)   General bed mobility comments: Use of bed rail.  Transfers Overall transfer level: Needs assistance Equipment used: Rolling walker (2 wheeled) Transfers: Sit to/from Stand Sit to Stand: Min guard         General transfer comment: Minguard for safety as patient initially unsteady. Cues for safe hand placement and not to pull up from RW  Ambulation/Gait Ambulation/Gait assistance: Min guard Ambulation Distance (Feet): 180 Feet Assistive device: Rolling walker (2 wheeled) Gait Pattern/deviations: Step-through pattern;Decreased stride length;Trunk flexed Gait velocity: Fluctuates from fast to slow.   General Gait Details: Verbal cues to stay closer to RW, and to stand upright during gait.  Patient easily distracted by obstacles or others in hallway.  At times, ambulating too quickly - cues to slow down for safety.   Stairs            Wheelchair Mobility    Modified Rankin (Stroke  Patients Only) Modified Rankin (Stroke Patients Only) Pre-Morbid Rankin Score: Slight disability Modified Rankin: Moderately severe disability     Balance                                    Cognition Arousal/Alertness: Awake/alert Behavior During Therapy: Impulsive Overall Cognitive Status: Impaired/Different from baseline Area of Impairment: Orientation;Safety/judgement;Awareness;Problem solving Orientation Level: Disoriented to;Situation Current Attention Level: Sustained Memory: Decreased recall of precautions;Decreased short-term memory   Safety/Judgement: Decreased awareness of safety;Decreased awareness of deficits   Problem Solving: Slow processing      Exercises      General Comments        Pertinent Vitals/Pain Pain Assessment: No/denies pain    Home Living                      Prior Function            PT Goals (current goals can now be found in the care plan section) Progress towards PT goals: Progressing toward goals    Frequency  Min 2X/week    PT Plan Current plan remains appropriate    Co-evaluation             End of Session Equipment Utilized During Treatment: Gait belt Activity Tolerance: Patient tolerated treatment well Patient left: in bed;with call bell/phone within reach;with bed alarm set;with family/visitor present     Time: [id number] PT Time Calculation (min) (ACUTE ONLY): 16 min  Charges:  $Gait Training: 8-22 mins  G CodesVena Austria:      Brach Birdsall H 09/20/2014, 6:37 PM Durenda HurtSusan H. Renaldo Fiddleravis, PT, [facility] Acute Rehab Services Pager [phone]

## 2014-09-20 NOTE — Procedures (Signed)
Objective Swallowing Evaluation: Modified Barium Swallowing Study  Patient Details  Name: Laurie Horne MRN: [id number] Date of Birth: 03/15/1934  Today's Date: 09/20/2014 Time: 0115-0130 SLP Time Calculation (min) (ACUTE ONLY): 15 min  Past Medical History:  Past Medical History  Diagnosis Date  . Hyperlipidemia   . Hypertension   . History of tobacco abuse    Past Surgical History:  Past Surgical History  Procedure Laterality Date  . Tonsillectomy and adenoidectomy    . Appendectomy    . Childbirth      x 3  . Esophagogastroduodenoscopy N/A 09/18/2014    Procedure: ESOPHAGOGASTRODUODENOSCOPY (EGD);  Surgeon: Shirley FriarVincent C. Schooler, MD;  [facility];  Service: Endoscopy;  Laterality: N/A;  . Balloon dilation N/A 09/18/2014    Procedure: BALLOON DILATION;  Surgeon: Shirley FriarVincent C. Schooler, MD;  [facility];  Service: Endoscopy;  Laterality: N/A;  . Savory dilation N/A 09/18/2014    Procedure: SAVORY DILATION;  Surgeon: Shirley FriarVincent C. Schooler, MD;  [facility];  Service: Endoscopy;  Laterality: N/A;  no fluro needed   HPI:  [id number] year old female with history of HTN, everyday smoker, and presumed COPD. [facility] after falls with acute respiratory failure. She was intubated in ICU and eventually discharged to SNF. Family reports that she has been anorexic for about the past 5 years and barely eats. After being discharged from SNF her USOH was significantly diminished when compared to that of before her hospitalization. Husband states that she has not been herself for some time. Over the past several months her abilities have been reduced. She can barely walk anymore according to her husband and has had several falls over the last 7 days. Her husband states that she has struck her head hard several times while falling. He is also concerned that she has been overdosing on her prescribed medications, he is not sure if this is intentional or not. For  that reason he has been assisting her with administration. 09/06/14 he thinks she took her meds again after he gave them to her because he found her unresponsive on the couch in the PM. [facility] and was noted to be in hypercarbic respiratory failure so she was placed on BiPAP. CT of the head was performed on 09/07/14, which showed acute intracranial hemorrhage. Her mental status remains diminished. EGD completed on 09/18/14 and Esophagram on 09/15/14 indicated prominent cricopharyngeal bar with marked narrowing of the cervical esophagus .  MBS ordered to assess swallowing capacity and to eliminate oropharyngeal issues related to dysphagia.     Assessment / Plan / Recommendation Clinical Impression  Dysphagia Diagnosis: Suspected primary esophageal dysphagia; mild pharyngeal phase dysphagia; pt exhibited mild vallecular/pyriform residue which cleared with subsequent swallow with puree-solid consistencies; premature loss into vallecular space noted with larger amounts of thin liquids probable d/t presbyphagia; no aspiration or penetration noted throughout study; recommend Regular diet (as tolerated) with esophageal precautions and double swallow prn to clear residue in pharynx during meals.    Treatment Recommendation  Therapy as outlined in treatment plan below    Diet Recommendation Regular;Thin liquid (as tolerated with esophageal precautions; double swallow prn)   Liquid Administration via: Cup;Straw Medication Administration: Whole meds with puree (prn) Supervision: Patient able to self feed Compensations: Slow rate;Small sips/bites;Follow solids with liquid (double swallow prn) Postural Changes and/or Swallow Maneuvers: Seated upright 90 degrees    Other  Recommendations Recommended Consults: Consider esophageal assessment Oral  Care Recommendations: Oral care BID   Follow Up Recommendations  Other (comment) (SLE)    Frequency and Duration min 1 x/week  1  week   Pertinent Vitals/Pain WDL    SLP Swallow Goals  See POC   General Date of Onset: 09/06/14 HPI: 80 year old female with history of HTN, everyday smoker, and presumed COPD. [facility] after falls with acute respiratory failure. She was intubated in ICU and eventually discharged to SNF. Family reports that she has been anorexic for about the past 5 years and barely eats. After being discharged from SNF her USOH was significantly diminished when compared to that of before her hospitalization. Husband states that she has not been herself for some time. Over the past several months her abilities have been reduced. She can barely walk anymore according to her husband and has had several falls over the last 7 days. Her husband states that she has struck her head hard several times while falling. He is also concerned that she has been overdosing on her prescribed medications, he is not sure if this is intentional or not. For that reason he has been assisting her with administration. 09/06/14 he thinks she took her meds again after he gave them to her because he found her unresponsive on the couch in the PM. [facility] and was noted to be in hypercarbic respiratory failure so she was placed on BiPAP. CT of the head was performed on 09/07/14, which showed acute intracranial hemorrhage. Her mental status remains diminished. EGD scheduled for 09/18/14, but not completed.  MBS ordered to assess swallowing capacity and to eliminate oralpharyngeal issues relating to dysphagia. Type of Study: Modified Barium Swallowing Study Reason for Referral: Objectively evaluate swallowing function Diet Prior to this Study: Thin liquids Temperature Spikes Noted: No Respiratory Status: Nasal cannula History of Recent Intubation: No Behavior/Cognition: Alert;Cooperative;Distractible;Requires cueing Oral Cavity - Dentition: Adequate natural dentition Oral Motor / Sensory  Function: Within functional limits Self-Feeding Abilities: Able to feed self Patient Positioning: Upright in chair Baseline Vocal Quality: Clear Volitional Cough: Strong Volitional Swallow: Able to elicit Anatomy: Within functional limits Pharyngeal Secretions: Not observed secondary MBS    Reason for Referral Objectively evaluate swallowing function   Oral Phase Oral Preparation/Oral Phase Oral Phase: WFL   Pharyngeal Phase Pharyngeal Phase Pharyngeal Phase: Impaired Pharyngeal - Solids Pharyngeal - Puree: Pharyngeal residue - pyriform sinuses;Pharyngeal residue - valleculae Pharyngeal - Regular: Pharyngeal residue - valleculae;Pharyngeal residue - pyriform sinuses Pharyngeal Phase - Comment Pharyngeal Comment: material cleared with subsequent swallows  Cervical Esophageal Phase        Cervical Esophageal Phase Cervical Esophageal [facility]: [facility] Cervical Esophageal Phase - Comment Cervical Esophageal Comment: Residue in valleculae/pyriform sinuses noted after swallow; cleared with subsequent swallow    Functional Assessment Tool Used: NOMS Functional Limitations: Swallowing Swallow Current Status ([id number](G8996): At least 1 percent but less than 20 percent impaired, limited or restricted Swallow Goal Status [phone](G8997): At least 1 percent but less than 20 percent impaired, limited or restricted Swallow Discharge Status [phone](G8998): At least 1 percent but less than 20 percent impaired, limited or restricted    Decarla Siemen,PAT, M.S., CCC-SLP 09/20/2014, 2:11 PM

## 2014-09-20 NOTE — Progress Notes (Signed)
[facility] PROGRESS NOTE  Laurie Horne ZOX:[id number] DOB: 04/10/1934 DOA: 09/06/2014 PCP: No primary care provider on file.  Assessment/Plan: Dysphagia Patient mentioned that she doesn't eat appropriately because she has difficulty swallowing. History was inconsistent. There is concern that she primarily has anorexia. Barium swallow was done to further evaluate and it is noted to be abnormal. Gi consulted and pt is now s/p EGD 12/26 with findings of mild-moderate prox esophagus narrowing that is benign in appearance. Pt to undergo barium swallow today. Pending results, either follow up EGD vs advancing diet as tolerated  Depression/cognitive impairment -She was re-evaluated by psychiatry. She doesn't need to go to any psychiatric facilities at this time. -Drug overdose was likely not intentional. Cognitive impairment may have contributed. -Appreciate neurology input. Repeat CT head report was reviewed. Thiamine was initiated.   Traumatic ICH in setting of frequent falls.  -Per neurosurgery no further diagnostic/therapeutic modalities required. -PT/OT recommends SNF  Acute metabolic encephalopathy  -Most likely multifactorial to include acute ICH vs 2nd to ICH, UTI -Now appears back to baseline  Acute on chronic hypercarbic respiratory failure 2/2 presumed overdose  -Resolved; patient states does not use home O2  -Hx COPD without evidence of acute exacerbation -PRN BDs  Sinus bradycardia -Most likely secondary to beta blocker overdose; resolved  Hypotension  -Resolved  HTN -Stable,  -For now continue with amlodipine 5 mg daily -Hydralazine PRN to maintain SBP<170  Hyponatremia Stable. Follow lytes  Protein calorie malnutrition -Diet regular with ensure between meals  Nicotine abuse -Continue nicotine patch  Nodular Lesions on Right Leg Per husband these have been present for at least 3 weeks but he is unsure. Unclear pathology but could be sebaceous cysts.  Will need dermatology evaluation as OP.  Code Status: DNR Family Communication: Pt in room, husband at bedside Disposition Plan: pending   Consultants:  GI  Psychiatry  Neurology  Critical care  Procedures:    Antibiotics:    HPI/Subjective: No acute events noted. Pt wants to go home  Objective: Filed Vitals:   09/19/14 2100 09/20/14 0208 09/20/14 0512 09/20/14 0907  BP: 129/75 124/72 148/82 149/84  Pulse: 81 81 82 75  Temp: 98.7 F (37.1 C) 97.7 F (36.5 C) 97.8 F (36.6 C) 97.7 F (36.5 C)  TempSrc: Oral Oral Oral Oral  Resp: 20 20 20 19   Height:      Weight:   36.56 kg (80 lb 9.6 oz)   SpO2: 100% 98% 95% 96%    Intake/Output Summary (Last 24 hours) at 09/20/14 1407 Last data filed at 09/19/14 1743  Gross per 24 hour  Intake    240 ml  Output      0 ml  Net    240 ml   Filed Weights   09/18/14 0527 09/19/14 0600 09/20/14 0512  Weight: 39.917 kg (88 lb) 39.055 kg (86 lb 1.6 oz) 36.56 kg (80 lb 9.6 oz)    Exam:   General:  Awake, in nad  Cardiovascular: regular, s1, s2  Respiratory: normal resp effort, no wheezing  Abdomen: soft,nondistended  Musculoskeletal: perfused, no clubbing   Data Reviewed: Basic Metabolic Panel:  Recent Labs Lab 09/14/14 0636 09/15/14 0447 09/16/14 0508 09/19/14 0509  NA 132*  --  132* 133*  Horne 4.6  --  4.8 4.7  CL 88*  --  86* 93*  CO2 39*  --  39* 33*  GLUCOSE 93  --  92 94  BUN <5*  --  6 <5*  CREATININE  0.46*  --  0.50 0.37*  CALCIUM 8.9  --  9.4 9.1  MG 2.0 2.0  --   --    Liver Function Tests:  Recent Labs Lab 09/14/14 0636  AST 24  ALT 14  ALKPHOS 83  BILITOT 0.4  PROT 5.8*  ALBUMIN 2.9*   No results for input(s): LIPASE, AMYLASE in the last 168 hours. No results for input(s): AMMONIA in the last 168 hours. CBC:  Recent Labs Lab 09/14/14 0636 09/15/14 0447 09/16/14 0508 09/19/14 0509  WBC 8.6 7.6 8.2 14.5*  NEUTROABS 5.8 5.0  --   --   HGB 13.9 13.5 14.0 13.2  HCT 43.9 43.6  44.2 42.3  MCV 91.6 91.8 91.1 93.0  PLT 202 204 223 250   Cardiac Enzymes: No results for input(s): CKTOTAL, CKMB, CKMBINDEX, TROPONINI in the last 168 hours. BNP (last 3 results)  Recent Labs  11/21/13 1550  PROBNP 16993.0*   CBG:  Recent Labs Lab 09/18/14 0640  GLUCAP 83    Recent Results (from the past 240 hour(s))  Clostridium Difficile by PCR     Status: None   Collection Time: 09/11/14  2:00 PM  Result Value Ref Range Status   C difficile by pcr NEGATIVE NEGATIVE Final     Studies: No results found.  Scheduled Meds: . amLODipine  5 mg Oral Daily  . antiseptic oral rinse  7 mL Mouth Rinse q12n4p  . aspirin  81 mg Oral Daily  . brinzolamide  1 drop Both Eyes Daily  . chlordiazePOXIDE  5 mg Oral Daily  . chlorhexidine  15 mL Mouth Rinse BID  . feeding supplement (ENSURE COMPLETE)  237 mL Oral TID BM  . latanoprost  1 drop Both Eyes QHS  . multivitamin with minerals  1 tablet Oral Daily  . nicotine  14 mg Transdermal Q24H  . pantoprazole  40 mg Oral QHS  . sodium chloride  3 mL Intravenous Q12H  . thiamine  100 mg Oral Daily   Continuous Infusions:   Principal Problem:   Dysphagia Active Problems:   ICH (intracerebral hemorrhage)   Anxiety   Depression   Acute on chronic respiratory failure with hypercapnia   Adverse reaction to beta-blocker   Altered awareness, transient   Sinus bradycardia   Other specified hypotension   Essential hypertension   Hyponatremia   Protein-calorie malnutrition   Nicotine abuse   Intracranial bleeding   Suicide attempt   Traumatic intracranial hemorrhage   Metabolic encephalopathy   Hyperkalemia   Anxiety state   Skin lesion of right lower extremity   Bradycardia, sinus   Diarrhea  Time spent: 25min  Laurie Horne  [facility] Pager [phone]. If 7PM-7AM, please contact night-coverage at [email or web address], password [email or web address] 09/20/2014, 2:07 PM  LOS: 14 days

## 2014-09-21 MED ORDER — ENSURE COMPLETE PO LIQD
237.0000 mL | ORAL | Status: DC
  Administered 2014-09-22: 237 mL via ORAL

## 2014-09-21 MED ORDER — SODIUM CHLORIDE 0.9 % IV SOLN
INTRAVENOUS | Status: DC
Start: 2014-09-21 — End: 2014-09-22
  Administered 2014-09-21: 20:00:00 via INTRAVENOUS

## 2014-09-21 MED ORDER — BOOST / RESOURCE BREEZE PO LIQD
1.0000 | Freq: Once | ORAL | Status: AC
  Administered 2014-09-21: 1 via ORAL

## 2014-09-21 NOTE — Progress Notes (Signed)
The patient has now decided after discussion with her husband that she wants to go ahead with dilatation. Discussed the risks of bleeding and perforation with husband. Will plan savory dilatation with fluro tomorrow at 10 AM with MAC

## 2014-09-21 NOTE — Clinical Social Work Note (Signed)
CSW reviewed chart. Once pt is medically stable [facility] to [facility]. CSW will continue to follow and assist pt.   Lori Liew, MSW, LCSWA [phone]

## 2014-09-21 NOTE — Progress Notes (Signed)
[facility] PROGRESS NOTE Subjective Results of MBS noted. Mild oropharyngeal dysphagia. Pt states she is able to eat slowly.  Objective: Vital signs in last 24 hours: Temp:  [97.7 F (36.5 C)-98.7 F (37.1 C)] 98.7 F (37.1 C) (12/29 0500) Pulse Rate:  [75-86] 83 (12/29 0500) Resp:  [16-20] 18 (12/29 0500) BP: (134-149)/(71-84) 135/74 mmHg (12/29 0500) SpO2:  [96 %-100 %] 99 % (12/29 0500) Weight:  [40.96 kg (90 lb 4.8 oz)] 40.96 kg (90 lb 4.8 oz) (12/29 0500) Last BM Date: 09/18/14  Intake/Output from previous day:   Intake/Output this shift:      Lab Results:  Recent Labs  09/19/14 0509  WBC 14.5*  HGB 13.2  HCT 42.3  PLT 250   BMET  Recent Labs  09/19/14 0509  NA 133*  K 4.7  CL 93*  CO2 33*  CREATININE 0.37*   LFT No results for input(s): PROT, AST, ALT, ALKPHOS, BILITOT, BILIDIR, IBILI in the last 72 hours. PT/INR No results for input(s): LABPROT, INR in the last 72 hours. PANCREAS No results for input(s): LIPASE in the last 72 hours.       Studies/Results: Dg Swallowing Func-speech Pathology  09/20/2014   Rod Mae, CCC-SLP     09/20/2014  2:40 PM Objective Swallowing Evaluation: Modified Barium Swallowing Study   Patient Details  Name: Laurie Horne MRN: [id number] Date of Birth: 29-Nov-1933  Today's Date: 09/20/2014 Time: 0115-0130 SLP Time Calculation (min) (ACUTE ONLY): 15 min  Past Medical History:  Past Medical History  Diagnosis Date  . Hyperlipidemia   . Hypertension   . History of tobacco abuse    Past Surgical History:  Past Surgical History  Procedure Laterality Date  . Tonsillectomy and adenoidectomy    . Appendectomy    . Childbirth      x 3  . Esophagogastroduodenoscopy N/A 09/18/2014    Procedure: ESOPHAGOGASTRODUODENOSCOPY (EGD);  Surgeon: Shirley Friar, MD;  Location: [facility] ENDOSCOPY;  Service: Endoscopy;   Laterality: N/A;  . Balloon dilation N/A 09/18/2014    Procedure: BALLOON DILATION;  Surgeon: Shirley Friar, MD;   Location: [facility] ENDOSCOPY;  Service: Endoscopy;  Laterality: N/A;  . Savory dilation N/A 09/18/2014    Procedure: SAVORY DILATION;  Surgeon: Shirley Friar, MD;   Location: [facility] ENDOSCOPY;  Service: Endoscopy;  Laterality: N/A;   no fluro needed   HPI:  80 year old female with history of HTN, everyday smoker, and  presumed COPD. She was admitted to [facility] in 10/2013 after falls with  acute respiratory failure. She was intubated in ICU and  eventually discharged to SNF. Family reports that she has been  anorexic for about the past 5 years and barely eats. After being  discharged from SNF her USOH was significantly diminished when  compared to that of before her hospitalization. Husband states  that she has not been herself for some time. Over the past  several months her abilities have been reduced. She can barely  walk anymore according to her husband and has had several falls  over the last 7 days. Her husband states that she has struck her  head hard several times while falling. He is also concerned that  she has been overdosing on her prescribed medications, he is not  sure if this is intentional or not. For that reason he has been  assisting her with administration. 09/06/14 he thinks she took  her meds again after he gave them to her because  he found her  unresponsive on the couch in the PM. [facility] and was noted to be in hypercarbic respiratory failure  so she was placed on BiPAP. CT of the head was performed on  09/07/14, which showed acute intracranial hemorrhage. Her mental  status remains diminished. EGD completed on 09/18/14 and  Esophagram on 09/15/14 indicated prominent cricopharyngeal bar  with marked narrowing of the cervical esophagus .  MBS ordered to assess swallowing capacity and to  eliminate oropharyngeal issues related to dysphagia.     Assessment / Plan / Recommendation Clinical Impression  Dysphagia Diagnosis: Suspected primary esophageal dysphagia;   mild pharyngeal phase dysphagia; pt exhibited mild  vallecular/pyriform residue which cleared with subsequent swallow  with puree-solid consistencies; premature loss into vallecular  space noted with larger amounts of thin liquids probable d/t  presbyphagia; no aspiration or penetration noted throughout  study; recommend Regular diet (as tolerated) with esophageal  precautions and double swallow prn to clear residue in pharynx  during meals.    Treatment Recommendation  Therapy as outlined in treatment plan below    Diet Recommendation Regular;Thin liquid (as tolerated with  esophageal precautions; double swallow prn)   Liquid Administration via: Cup;Straw Medication Administration: Whole meds with puree (prn) Supervision: Patient able to self feed Compensations: Slow rate;Small sips/bites;Follow solids with  liquid (double swallow prn) Postural Changes and/or Swallow Maneuvers: Seated upright 90  degrees    Other  Recommendations Recommended Consults: Consider esophageal  assessment Oral Care Recommendations: Oral care BID   Follow Up Recommendations  Other (comment) (SLE)    Frequency and Duration min 1 x/week  1 week   Pertinent Vitals/Pain WDL    SLP Swallow Goals  See POC   General Date of Onset: 09/06/14 HPI: 80 year old female with history of HTN, everyday smoker, and  presumed COPD. [facility] falls with  acute respiratory failure. She was intubated in ICU and  eventually discharged to SNF. Family reports that she has been  anorexic for about the past 5 years and barely eats. After being  discharged from SNF her USOH was significantly diminished when  compared to that of before her hospitalization. Husband states  that she has not been herself for some time. Over the past  several months her abilities have been reduced. She can barely  walk anymore according to her husband and has had several falls  over the last 7 days. Her husband states that she has struck her  head hard  several times while falling. He is also concerned that  she has been overdosing on her prescribed medications, he is not  sure if this is intentional or not. For that reason he has been  assisting her with administration. 09/06/14 he thinks she took  her meds again after he gave them to her because he found her  unresponsive on the couch in the PM. [facility]  [facility] and was noted to be in hypercarbic respiratory failure  so she was placed on BiPAP. CT of the head was performed on  09/07/14, which showed acute intracranial hemorrhage. Her mental  status remains diminished. EGD scheduled for 09/18/14, but not  completed.  MBS ordered to assess swallowing capacity and to  eliminate oralpharyngeal issues relating to dysphagia. Type of Study: Modified Barium Swallowing Study Reason for Referral: Objectively evaluate swallowing function Diet Prior to this Study: Thin liquids Temperature Spikes  Noted: No Respiratory Status: Nasal cannula History of Recent Intubation: No Behavior/Cognition: Alert;Cooperative;Distractible;Requires  cueing Oral Cavity - Dentition: Adequate natural dentition Oral Motor / Sensory Function: Within functional limits Self-Feeding Abilities: Able to feed self Patient Positioning: Upright in chair Baseline Vocal Quality: Clear Volitional Cough: Strong Volitional Swallow: Able to elicit Anatomy: Within functional limits Pharyngeal Secretions: Not observed secondary MBS    Reason for Referral Objectively evaluate swallowing function   Oral Phase Oral Preparation/Oral Phase Oral Phase: WFL   Pharyngeal Phase Pharyngeal Phase Pharyngeal Phase: Impaired Pharyngeal - Solids Pharyngeal - Puree: Pharyngeal residue - pyriform  sinuses;Pharyngeal residue - valleculae Pharyngeal - Regular: Pharyngeal residue - valleculae;Pharyngeal  residue - pyriform sinuses Pharyngeal Phase - Comment Pharyngeal Comment: material cleared with subsequent swallows  Cervical Esophageal Phase        Cervical  Esophageal Phase Cervical Esophageal Phase: [facility] Cervical Esophageal Phase - Comment Cervical Esophageal Comment: Residue in valleculae/pyriform  sinuses noted after swallow; cleared with subsequent swallow    Functional Assessment Tool Used: NOMS Functional Limitations: Swallowing Swallow Current Status ([id number](G8996): At least 1 percent but less than  20 percent impaired, limited or restricted Swallow Goal Status [phone]([id number]): At least 1 percent but less than 20  percent impaired, limited or restricted Swallow Discharge Status [phone](G8998): At least 1 percent but less  than 20 percent impaired, limited or restricted    ADAMS,PAT, M.S., CCC-SLP 09/20/2014, 2:11 PM     Medications: I have reviewed the patient's current medications.  Assessment/Plan: 1. Dysphagia. 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 Discussed dilatation and the risks involved. Pt doesn't feel she needs it. Would send her home on mechanically soft diet and have her follow-up with Dr Bosie Closschooler in 3-4 weeks to reassess and rediscuss dilation if she is not doing well.   Gizel Riedlinger JR,Riya Huxford L 09/21/2014, 8:15 AM

## 2014-09-21 NOTE — Progress Notes (Signed)
NUTRITION FOLLOW-UP  DOCUMENTATION CODES Per approved criteria  -Severe malnutrition in the context of chronic illness -Underweight   INTERVENTION: Continue Ensure Complete PO once daily, provides 350 kcal and 13 grams of protein Provide Magic Cup ice cream once daily Encourage adequate PO intake  NUTRITION DIAGNOSIS: Malnurition related to chronic illness as evidenced by severe fat and muscle depletion; ongoing.   Goal: Pt to meet >/= 90% of their estimated nutrition needs; not met.    Monitor:  PO intake, supplement acceptance, weight trends, labs  ASSESSMENT: Pt admitted from home where she lives with her husband after being found unresponsive. Husband concerned that pt took a double dose of her medications. Per family she has been depressed lately.    Per chart, plan is for savory dilatation with fluro tomorrow at 10 AM.  Pt reports that she is swallowing a little better and has been eating a little more. Lunch tray at bedside with about 75% of food consumed. Pt reports she has been eating 50-75% of most meals and drinking Ensure Complete 1-2 times daily even though she doesn't like Ensure. RD offered alternative supplements- pt would like to try Magic Cup ice cream. Pt is now on a clear liquid diet with orders for NPO at midnight.  RD encouraged pt to continue eating well and to continue with nutritional supplements to improve nutritional status. Pt's weight has gone up 2 lbs in the past week.   Height: Ht Readings from Last 1 Encounters:  09/07/14 $RemoveB'5\' 5"'ADKqvsaK$  (1.651 m)    Weight: Wt Readings from Last 1 Encounters:  09/21/14 90 lb 4.8 oz (40.96 kg)  09/15/14 88 lb  BMI:  Body mass index is 15.03 kg/(m^2). Underweight  Re-Estimated Nutritional Needs: Kcal: 1300-1500 Protein: 60-75 grams Fluid: > 1.5 L/day  Skin: multiple scabbed areas to all extremities   Diet Order: Diet clear liquid Diet NPO time specified  No intake or output data in the 24 hours ending 09/21/14  1403  Last BM: 12/29  Labs:   Recent Labs Lab 09/15/14 0447 09/16/14 0508 09/19/14 0509  NA  --  132* 133*  K  --  4.8 4.7  CL  --  86* 93*  CO2  --  39* 33*  BUN  --  6 <5*  CREATININE  --  0.50 0.37*  CALCIUM  --  9.4 9.1  MG 2.0  --   --   GLUCOSE  --  92 94    CBG (last 3)  No results for input(s): GLUCAP in the last 72 hours.  Scheduled Meds: . amLODipine  5 mg Oral Daily  . antiseptic oral rinse  7 mL Mouth Rinse q12n4p  . aspirin  81 mg Oral Daily  . brinzolamide  1 drop Both Eyes Daily  . chlordiazePOXIDE  5 mg Oral Daily  . chlorhexidine  15 mL Mouth Rinse BID  . feeding supplement (ENSURE COMPLETE)  237 mL Oral TID BM  . latanoprost  1 drop Both Eyes QHS  . multivitamin with minerals  1 tablet Oral Daily  . nicotine  14 mg Transdermal Q24H  . pantoprazole  40 mg Oral QHS  . sodium chloride  3 mL Intravenous Q12H  . thiamine  100 mg Oral Daily    Continuous Infusions:    Pryor Ochoa RD, LDN Inpatient Clinical Dietitian Pager: [phone] After Hours Pager: [phone]

## 2014-09-21 NOTE — Progress Notes (Signed)
[facility] PROGRESS NOTE  [name]:[id number] DOB: 11-Apr-1934 DOA: 09/06/2014 PCP: No primary care provider on file.   This is a 80 year old female who was admitted on 12/15 by the ICU service with altered mental status after taking double doses of her medications. She was noted to be hypercarbic and started on a BiPAP in the ER. CT of the head revealed acute hemorrhage in the lateral ventricles without hydrocephalus.  Assessment/Plan:  Traumatic ICH in setting of frequent falls.  -Per neurosurgery no further diagnostic/therapeutic modalities required. -PT/OT recommends SNF  Dysphagia Patient mentioned that she doesn't eat appropriately because she has difficulty swallowing. History was inconsistent. There is concern that she primarily has anorexia. Barium swallow was done to further evaluate and it is noted to be abnormal. Gi consulted and pt is now s/p EGD 12/26 with findings of mild-moderate prox esophagus narrowing that is benign in appearance.  -Barium swallow reveals primary esophageal dysphagia-GI recommended EGD with dilatation and plan is to do this tomorrow at 10 AM   Depression/cognitive impairment/unintentional drug overdose -Significant short-term memory loss -She was re-evaluated by psychiatry. She doesn't need to go to any psychiatric facility at this time. -Drug overdose was likely not intentional. Cognitive impairment may have contributed. -Appreciate neurology input. Repeat CT head report was reviewed. Thiamine was initiated.   Acute metabolic encephalopathy  -Most likely multifactorial to include acute ICH vs 2nd to ICH, UTI -Now appears back to baseline  Acute on chronic hypercarbic respiratory failure 2/2 presumed overdose  -Resolved; patient states does not use home O2  -Hx COPD without evidence of acute exacerbation -PRN BDs  Sinus bradycardia -Most likely secondary to beta blocker overdose; resolved  Hypotension   -Resolved  HTN -Stable,  -For now continue with amlodipine 5 mg daily -Hydralazine PRN to maintain SBP<170  Hyponatremia Stable. Follow lytes  Protein calorie malnutrition -Diet regular with ensure between meals  Nicotine abuse -Continue nicotine patch  Nodular Lesions on Right Leg Per husband these have been present for at least 3 weeks but he is unsure. Unclear pathology but could be sebaceous cysts. Will need dermatology evaluation as OP.  Code Status: DNR Family Communication: Pt in room, husband at bedside Disposition Plan: pending   Consultants:  GI  Psychiatry  Neurology  Critical care   HPI/Subjective: Patient is alert and drinking Ensure. She states that she is having trouble swallowing. She cannot recall what Dr. Randa Evens spoke with her about just earlier today. I have discussed the fact that he recommended dilatation of her esophagus and she declined this. She tells me that she now wants it and her husband is at bedside who is agreeing that "something needs to be done".  Objective: Filed Vitals:   09/21/14 0500 09/21/14 1026 09/21/14 1027 09/21/14 1432  BP: 135/74 131/96 131/96 132/76  Pulse: 83  87 89  Temp: 98.7 F (37.1 C)  98.2 F (36.8 C) 98.3 F (36.8 C)  TempSrc: Oral  Oral Oral  Resp: 18  20 17   Height:      Weight: 40.96 kg (90 lb 4.8 oz)     SpO2: 99%  96% 100%   No intake or output data in the 24 hours ending 09/21/14 1550 Filed Weights   09/19/14 0600 09/20/14 0512 09/21/14 0500  Weight: 39.055 kg (86 lb 1.6 oz) 36.56 kg (80 lb 9.6 oz) 40.96 kg (90 lb 4.8 oz)    Exam:   General:  Awake, in nad  Cardiovascular: regular, s1, s2  Respiratory:  normal resp effort, no wheezing  Abdomen: soft,nondistended  Musculoskeletal: perfused, no clubbing   Data Reviewed: Basic Metabolic Panel:  Recent Labs Lab 09/15/14 0447 09/16/14 0508 09/19/14 0509  NA  --  132* 133*  K  --  4.8 4.7  CL  --  86* 93*  CO2  --  39* 33*   GLUCOSE  --  92 94  BUN  --  6 <5*  CREATININE  --  0.50 0.37*  CALCIUM  --  9.4 9.1  MG 2.0  --   --    Liver Function Tests: No results for input(s): AST, ALT, ALKPHOS, BILITOT, PROT, ALBUMIN in the last 168 hours. No results for input(s): LIPASE, AMYLASE in the last 168 hours. No results for input(s): AMMONIA in the last 168 hours. CBC:  Recent Labs Lab 09/15/14 0447 09/16/14 0508 09/19/14 0509  WBC 7.6 8.2 14.5*  NEUTROABS 5.0  --   --   HGB 13.5 14.0 13.2  HCT 43.6 44.2 42.3  MCV 91.8 91.1 93.0  PLT 204 223 250   Cardiac Enzymes: No results for input(s): CKTOTAL, CKMB, CKMBINDEX, TROPONINI in the last 168 hours. BNP (last 3 results)  Recent Labs  11/21/13 1550  PROBNP 16993.0*   CBG:  Recent Labs Lab 09/18/14 0640  GLUCAP 83    No results found for this or any previous visit (from the past 240 hour(s)).   Studies: Dg Swallowing Func-speech Pathology  09/20/2014   Rod Mae, CCC-SLP     09/20/2014  2:40 PM Objective Swallowing Evaluation: Modified Barium Swallowing Study   Patient Details  Name: Laurie Horne MRN: [id number] Date of Birth: [birth year]  Today's Date: 09/20/2014 Time: 0115-0130 SLP Time Calculation (min) (ACUTE ONLY): 15 min  Past Medical History:  Past Medical History  Diagnosis Date  . Hyperlipidemia   . Hypertension   . History of tobacco abuse    Past Surgical History:  Past Surgical History  Procedure Laterality Date  . Tonsillectomy and adenoidectomy    . Appendectomy    . Childbirth      x 3  . Esophagogastroduodenoscopy N/A 09/18/2014    Procedure: ESOPHAGOGASTRODUODENOSCOPY (EGD);  Surgeon: Shirley Friar, MD;  Location: [facility] ENDOSCOPY;  Service: Endoscopy;   Laterality: N/A;  . Balloon dilation N/A 09/18/2014    Procedure: BALLOON DILATION;  Surgeon: Shirley Friar, MD;   Location: [facility] ENDOSCOPY;  Service: Endoscopy;  Laterality: N/A;  . Savory dilation N/A 09/18/2014    Procedure: SAVORY DILATION;  Surgeon: Shirley Friar,  MD;   Location: [facility] ENDOSCOPY;  Service: Endoscopy;  Laterality: N/A;   no fluro needed   HPI:  80 year old female with history of HTN, everyday smoker, and  presumed COPD. She was admitted to [facility] in 10/2013 after falls with  acute respiratory failure. She was intubated in ICU and  eventually discharged to SNF. Family reports that she has been  anorexic for about the past 5 years and barely eats. After being  discharged from SNF her USOH was significantly diminished when  compared to that of before her hospitalization. Husband states  that she has not been herself for some time. Over the past  several months her abilities have been reduced. She can barely  walk anymore according to her husband and has had several falls  over the last 7 days. Her husband states that she has struck her  head hard several times while falling. He is also concerned that  she  has been overdosing on her prescribed medications, he is not  sure if this is intentional or not. For that reason he has been  assisting her with administration. 09/06/14 he thinks she took  her meds again after he gave them to her because he found her  unresponsive on the couch in the PM. [facility] and was noted to be in hypercarbic respiratory failure  so she was placed on BiPAP. CT of the head was performed on  09/07/14, which showed acute intracranial hemorrhage. Her mental  status remains diminished. EGD completed on 09/18/14 and  Esophagram on 09/15/14 indicated prominent cricopharyngeal bar  with marked narrowing of the cervical esophagus .  MBS ordered to assess swallowing capacity and to  eliminate oropharyngeal issues related to dysphagia.     Assessment / Plan / Recommendation Clinical Impression  Dysphagia Diagnosis: Suspected primary esophageal dysphagia;  mild pharyngeal phase dysphagia; pt exhibited mild  vallecular/pyriform residue which cleared with subsequent swallow  with puree-solid consistencies; premature loss into  vallecular  space noted with larger amounts of thin liquids probable d/t  presbyphagia; no aspiration or penetration noted throughout  study; recommend Regular diet (as tolerated) with esophageal  precautions and double swallow prn to clear residue in pharynx  during meals.    Treatment Recommendation  Therapy as outlined in treatment plan below    Diet Recommendation Regular;Thin liquid (as tolerated with  esophageal precautions; double swallow prn)   Liquid Administration via: Cup;Straw Medication Administration: Whole meds with puree (prn) Supervision: Patient able to self feed Compensations: Slow rate;Small sips/bites;Follow solids with  liquid (double swallow prn) Postural Changes and/or Swallow Maneuvers: Seated upright 90  degrees    Other  Recommendations Recommended Consults: Consider esophageal  assessment Oral Care Recommendations: Oral care BID   Follow Up Recommendations  Other (comment) (SLE)    Frequency and Duration min 1 x/week  1 week   Pertinent Vitals/Pain WDL    SLP Swallow Goals  See POC   General Date of Onset: 09/06/14 HPI: 80 year old female with history of HTN, everyday smoker, and  presumed COPD. [facility] after falls with  acute respiratory failure. She was intubated in ICU and  eventually discharged to SNF. Family reports that she has been  anorexic for about the past 5 years and barely eats. After being  discharged from SNF her USOH was significantly diminished when  compared to that of before her hospitalization. Husband states  that she has not been herself for some time. Over the past  several months her abilities have been reduced. She can barely  walk anymore according to her husband and has had several falls  over the last 7 days. Her husband states that she has struck her  head hard several times while falling. He is also concerned that  she has been overdosing on her prescribed medications, he is not  sure if this is intentional or not. For that reason he  has been  assisting her with administration. 09/06/14 he thinks she took  her meds again after he gave them to her because he found her  unresponsive on the couch in the PM. [facility]  [facility] and was noted to be in hypercarbic respiratory failure  so she was placed on BiPAP. CT of the head was performed on  09/07/14, which showed acute intracranial hemorrhage. Her mental  status remains diminished. EGD scheduled  for 09/18/14, but not  completed.  MBS ordered to assess swallowing capacity and to  eliminate oralpharyngeal issues relating to dysphagia. Type of Study: Modified Barium Swallowing Study Reason for Referral: Objectively evaluate swallowing function Diet Prior to this Study: Thin liquids Temperature Spikes Noted: No Respiratory Status: Nasal cannula History of Recent Intubation: No Behavior/Cognition: Alert;Cooperative;Distractible;Requires  cueing Oral Cavity - Dentition: Adequate natural dentition Oral Motor / Sensory Function: Within functional limits Self-Feeding Abilities: Able to feed self Patient Positioning: Upright in chair Baseline Vocal Quality: Clear Volitional Cough: Strong Volitional Swallow: Able to elicit Anatomy: Within functional limits Pharyngeal Secretions: Not observed secondary MBS    Reason for Referral Objectively evaluate swallowing function   Oral Phase Oral Preparation/Oral Phase Oral Phase: WFL   Pharyngeal Phase Pharyngeal Phase Pharyngeal Phase: Impaired Pharyngeal - Solids Pharyngeal - Puree: Pharyngeal residue - pyriform  sinuses;Pharyngeal residue - valleculae Pharyngeal - Regular: Pharyngeal residue - valleculae;Pharyngeal  residue - pyriform sinuses Pharyngeal Phase - Comment Pharyngeal Comment: material cleared with subsequent swallows  Cervical Esophageal Phase        Cervical Esophageal Phase Cervical Esophageal Phase: [facility] Cervical Esophageal Phase - Comment Cervical Esophageal Comment: Residue in valleculae/pyriform  sinuses noted after swallow;  cleared with subsequent swallow    Functional Assessment Tool Used: NOMS Functional Limitations: Swallowing Swallow Current Status ([id number](G8996): At least 1 percent but less than  20 percent impaired, limited or restricted Swallow Goal Status [phone](G8997): At least 1 percent but less than 20  percent impaired, limited or restricted Swallow Discharge Status [phone](G8998): At least 1 percent but less  than 20 percent impaired, limited or restricted    ADAMS,PAT, M.S., CCC-SLP 09/20/2014, 2:11 PM     Scheduled Meds: . amLODipine  5 mg Oral Daily  . antiseptic oral rinse  7 mL Mouth Rinse q12n4p  . aspirin  81 mg Oral Daily  . brinzolamide  1 drop Both Eyes Daily  . chlordiazePOXIDE  5 mg Oral Daily  . chlorhexidine  15 mL Mouth Rinse BID  . [START ON 09/22/2014] feeding supplement (ENSURE COMPLETE)  237 mL Oral Q24H  . feeding supplement (RESOURCE BREEZE)  1 Container Oral Once  . latanoprost  1 drop Both Eyes QHS  . multivitamin with minerals  1 tablet Oral Daily  . nicotine  14 mg Transdermal Q24H  . pantoprazole  40 mg Oral QHS  . sodium chloride  3 mL Intravenous Q12H  . thiamine  100 mg Oral Daily   Continuous Infusions:   Principal Problem:   Dysphagia Active Problems:   ICH (intracerebral hemorrhage)   Anxiety   Depression   Acute on chronic respiratory failure with hypercapnia   Adverse reaction to beta-blocker   Altered awareness, transient   Sinus bradycardia   Other specified hypotension   Essential hypertension   Hyponatremia   Protein-calorie malnutrition   Nicotine abuse   Intracranial bleeding   Suicide attempt   Traumatic intracranial hemorrhage   Metabolic encephalopathy   Hyperkalemia   Anxiety state   Skin lesion of right lower extremity   Bradycardia, sinus   Diarrhea  Time spent: 25min  [facility] Center LLCRIZWAN,Samanvi Cuccia  [facility] Pager [phone]. If 7PM-7AM, please contact night-coverage at [email or web address], password [email or web address] 09/21/2014, 3:50 PM  LOS: 15 days

## 2014-09-22 ENCOUNTER — Inpatient Hospital Stay (HOSPITAL_COMMUNITY): Payer: Medicare Other

## 2014-09-22 ENCOUNTER — Encounter (HOSPITAL_COMMUNITY): Payer: Self-pay | Admitting: *Deleted

## 2014-09-22 ENCOUNTER — Encounter (HOSPITAL_COMMUNITY)
Admission: EM | Disposition: A | Discharge status: Skilled Nursing Facility | Payer: Self-pay | Source: Home / Self Care | Attending: Internal Medicine

## 2014-09-22 ENCOUNTER — Inpatient Hospital Stay (HOSPITAL_COMMUNITY): Payer: Medicare Other | Admitting: Anesthesiology

## 2014-09-22 HISTORY — PX: SAVORY DILATION: SHX5439

## 2014-09-22 HISTORY — PX: ESOPHAGOGASTRODUODENOSCOPY (EGD) WITH PROPOFOL: SHX5813

## 2014-09-22 SURGERY — ESOPHAGOGASTRODUODENOSCOPY (EGD) WITH PROPOFOL
Anesthesia: Monitor Anesthesia Care

## 2014-09-22 SURGERY — EGD, WITH DILATION USING SAVARY-GILLIARD DILATOR OVER GUIDEWIRE
Anesthesia: Monitor Anesthesia Care

## 2014-09-22 MED ORDER — FENTANYL CITRATE 0.05 MG/ML IJ SOLN
INTRAMUSCULAR | Status: DC | PRN
  Administered 2014-09-22 (×2): 50 ug via INTRAVENOUS

## 2014-09-22 MED ORDER — BOOST / RESOURCE BREEZE PO LIQD
1.0000 | Freq: Once | ORAL | Status: AC
  Administered 2014-09-22: 1 via ORAL

## 2014-09-22 MED ORDER — PROPOFOL INFUSION 10 MG/ML OPTIME
INTRAVENOUS | Status: DC | PRN
  Administered 2014-09-22: 100 ug/kg/min via INTRAVENOUS

## 2014-09-22 MED ORDER — SODIUM CHLORIDE 0.9 % IV SOLN
INTRAVENOUS | Status: DC | PRN
  Administered 2014-09-22: 09:00:00 via INTRAVENOUS

## 2014-09-22 MED ORDER — FENTANYL CITRATE 0.05 MG/ML IJ SOLN: 25.0000 ug | INTRAMUSCULAR | Status: DC | PRN

## 2014-09-22 MED ORDER — ONDANSETRON HCL 4 MG/2ML IJ SOLN: 4.0000 mg | Freq: Once | INTRAMUSCULAR | Status: AC | PRN

## 2014-09-22 MED ORDER — BUTAMBEN-TETRACAINE-BENZOCAINE 2-2-14 % EX AERO
INHALATION_SPRAY | CUTANEOUS | Status: DC | PRN
  Administered 2014-09-22: 2 via TOPICAL

## 2014-09-22 NOTE — Anesthesia Preprocedure Evaluation (Signed)
Anesthesia Evaluation  Patient identified by MRN, date of birth, ID band Patient confused    Reviewed: Allergy & Precautions, H&P , NPO status , Patient's Chart, lab work & pertinent test results  Airway Mallampati: II       Dental  (+) Teeth Intact, Loose,    Pulmonary Current Smoker,    + decreased breath sounds      Cardiovascular hypertension, Rhythm:Regular Rate:Normal     Neuro/Psych    GI/Hepatic   Endo/Other    Renal/GU      Musculoskeletal   Abdominal   Peds  Hematology   Anesthesia Other Findings   Reproductive/Obstetrics                             Anesthesia Physical Anesthesia Plan  ASA: III  Anesthesia Plan: MAC   Post-op Pain Management:    Induction: Intravenous  Airway Management Planned: Natural Airway and Nasal Cannula  Additional Equipment:   Intra-op Plan:   Post-operative Plan: Extubation in OR  Informed Consent: I have reviewed the patients History and Physical, chart, labs and discussed the procedure including the risks, benefits and alternatives for the proposed anesthesia with the patient or authorized representative who has indicated his/her understanding and acceptance.   Dental advisory given  Plan Discussed with: CRNA and Anesthesiologist  Anesthesia Plan Comments:         Anesthesia Quick Evaluation

## 2014-09-22 NOTE — Anesthesia Postprocedure Evaluation (Signed)
  Anesthesia Post-op Note  Patient: Laurie Horne  Procedure(s) Performed: Procedure(s): SAVORY DILATION (N/A) ESOPHAGOGASTRODUODENOSCOPY (EGD) WITH PROPOFOL (N/A)  Patient Location: Endoscopy Unit  Anesthesia Type:MAC  Level of Consciousness: awake and oriented  Airway and Oxygen Therapy: Patient Spontanous Breathing and Patient connected to nasal cannula oxygen  Post-op Pain: none  Post-op Assessment: Post-op Vital signs reviewed, Patient's Cardiovascular Status Stable, Respiratory Function Stable, Patent Airway and Pain level controlled  Post-op Vital Signs: stable  Last Vitals:  Filed Vitals:   09/22/14 1010  BP: 152/83  Pulse: 80  Temp:   Resp: 15    Complications: No apparent anesthesia complications

## 2014-09-22 NOTE — Transfer of Care (Signed)
Immediate Anesthesia Transfer of Care Note  Patient: Dorcas McmurrayJoan C Finan  Procedure(s) Performed: Procedure(s): SAVORY DILATION (N/A) ESOPHAGOGASTRODUODENOSCOPY (EGD) WITH PROPOFOL (N/A)  Patient Location: Endoscopy Unit  Anesthesia Type:MAC  Level of Consciousness: awake, alert  and oriented  Airway & Oxygen Therapy: Patient Spontanous Breathing  Post-op Assessment: Report given to PACU RN  Post vital signs: Reviewed and stable  Complications: No apparent anesthesia complications

## 2014-09-22 NOTE — Interval H&P Note (Signed)
History and Physical Interval Note:  09/22/2014 9:04 AM  Laurie Horne  has presented today for surgery, with the diagnosis of stricture  The various methods of treatment have been discussed with the patient and family. After consideration of risks, benefits and other options for treatment, the patient has consented to  Procedure(s): SAVORY DILATION (N/A) ESOPHAGOGASTRODUODENOSCOPY (EGD) WITH PROPOFOL (N/A) as a surgical intervention .  The patient's history has been reviewed, patient examined, no change in status, stable for surgery.  I have reviewed the patient's chart and labs.  Questions were answered to the patient's satisfaction.     Britten Parady JR,Zelpha Messing L

## 2014-09-22 NOTE — Progress Notes (Signed)
Occupational Therapy Treatment Patient Details Name: Laurie Horne MRN: [id number] DOB: 05/29/1934 Today's Date: 09/22/2014    History of present illness 80 yo female s/p fall with IVH and respiratory failure. pt reports wishes to committ suicide. UTI (+) WUJ:[id number]:anxiety, HTN,depression, anorexia, home oxygen     OT comments  Spouse very anxious to speak with MD only in person. Pt upset on arrival stating "i dont know what is happening!" Pt anxious about pending d/c planning. Pt states "they say I am leaving and I am not leaving. I dont want to leave" pt unable to provide any details as to where she is leaving , how or why she is upset about leaving. Pt anxious about topic but from conversation this session doesn't fully understand [facility] and she is medically progressing. Pt appears to be anxious because spouse is anxious without her own understanding. Pt calmed by warm blanket in chair and pending lunch arrival. Pt completing word find and decr anxiety.   Follow Up Recommendations  SNF;Supervision/Assistance - 24 hour ([facility]    Equipment Recommendations  None recommended by OT    Recommendations for Other Services      Precautions / Restrictions Precautions Precautions: Fall Precaution Comments: Impulsive       Mobility Bed Mobility Overal bed mobility: Modified Independent                Transfers Overall transfer level: Needs assistance Equipment used: Rolling walker (2 wheeled) Transfers: Sit to/from Stand Sit to Stand: Min guard              Balance                                   ADL       Grooming: Wash/dry hands;Min guard Grooming Details (indicate cue type and reason): abandoned the [employer]W                  Toilet Transfer: Min guard;RW;Regular Toilet;Grab Agricultural consultantbars Toilet Transfer Details (indicate cue type and reason): pt standing from commode very impulsive without help and dragging RW toward  sink. Toileting- ArchitectClothing Manipulation and Hygiene: Min guard       Functional mobility during ADLs: Min guard;Rolling walker General ADL Comments: pt requires cues for safety and use of RW. pt very unsafe and will abandon RW. pt will release RW with bil UE and report dizziness. Pt positioned in chair with warm heated blanket in prep for lunch tray      Vision                     Perception     Praxis      Cognition   Behavior During Therapy: Impulsive Overall Cognitive Status: Impaired/Different from baseline Area of Impairment: Orientation;Safety/judgement;Awareness;Problem solving;Memory Orientation Level: Disoriented to;Situation Current Attention Level: Sustained Memory: Decreased short-term memory  Following Commands: Follows multi-step commands inconsistently Safety/Judgement: Decreased awareness of deficits;Decreased awareness of safety Awareness: Intellectual Problem Solving: Slow processing      Extremity/Trunk Assessment               Exercises     Shoulder Instructions       General Comments      Pertinent Vitals/ Pain       Pain Assessment: No/denies pain  Home Living  Prior Functioning/Environment              Frequency Min 2X/week     Progress Toward Goals  OT Goals(current goals can now be found in the care plan section)  Progress towards OT goals: Progressing toward goals  Acute Rehab OT Goals Patient Stated Goal: "i love tomatoe"  OT Goal Formulation: With patient/family Time For Goal Achievement: 09/27/14 Potential to Achieve Goals: Good ADL Goals Pt Will Perform Grooming: with supervision;sitting Pt Will Perform Upper Body Bathing: with supervision;sitting Pt Will Perform Lower Body Bathing: with min assist;sit to/from stand Pt Will Transfer to Toilet: with supervision;regular height toilet;ambulating;grab bars  Plan Discharge plan remains appropriate     Co-evaluation                 End of Session Equipment Utilized During Treatment: Gait belt;Rolling walker   Activity Tolerance Patient tolerated treatment well   Patient Left in chair;with call bell/phone within reach;with chair alarm set;with family/visitor present   Nurse Communication Mobility status;Precautions        Time: [id number] OT Time Calculation (min): 11 min  Charges: OT General Charges $OT Visit: 1 Procedure OT Treatments $Self Care/Home Management : 8-22 mins  Boone MasterJones, Josha Weekley B 09/22/2014, 12:01 PM  Pager: [phone]

## 2014-09-22 NOTE — Progress Notes (Addendum)
[facility]                                                                              Patient Demographics  Laurie Horne, is a 80 y.o. female, DOB - August 10, 1934, ZOX:[id number]  Admit date - 09/06/2014   Admitting Physician Lupita Leash, MD  Outpatient Primary MD for the patient is No primary care provider on file.  LOS - 16   Chief Complaint  Patient presents with  . Altered Mental Status      HPI on 09/07/2014 by Dr. Billy Fischer 80 year old female with history of HTN, everyday smoker, and presumed COPD. She was admitted to [facility] in 10/2013 after falls with acute respiratory failure. She was intubated in ICU and eventually discharged to SNF. Family reports that she has been anorexic for about the past 5 years and barely eats. She chews her food and spits it out. Watching her weight has been very important to her. Weight in ED is 87lbs. After being discharged from SNF her USOH was significantly diminished when compared to that of before her hospitalization. Husband states that she has not been herself for some time. Over the past several months her abilities have been reduced. She can barely walk anymore according to her husband and has had several falls over the last 7 days. Her husband states that she has struck her head hard several times while falling. He is also concerned that she has been overdosing on her prescribed medications, he is not sure if this is intentional or not. For that reason he has been assisting her with administration. 12/15 he thinks she took her meds again after he gave them to her because he found her unresponsive on the cough in the PM. She was brought to [facility] ED and was noted to be in hypercarbic respiratory failure so she was placed on BiPAP. CT of the head was performed, which showed acute intracranial hemorrhage. Her mental status remains diminished. She was also bradycardic in ED. With concern that she overdosed on her home beta-blocker. She was given  glucagon in ED with minimal response.   Assessment & Plan  Traumatic ICH in setting of frequent falls.  -Per neurosurgery no further diagnostic/therapeutic modalities required. -PT/OT recommends SNF  Dysphagia -Had EGD 12/26: mild to moderate proximal esophageal narrowing -Barium swallow: esophageal dysphagia -Patient had EGD with dilatation today -GI recommended clear liquid and may advance this evening to soft diet  Depression/cognitive impairment/unintentional drug overdose -Significant short-term memory loss -She was re-evaluated by psychiatry. She doesn't need to go to any psychiatric facility at this time. -Patient will need to follow up with Dr. Jama Flavors as an outpatient -Drug overdose was likely unintentional. Cognitive impairment may have contributed. -Appreciate neurology input. Repeat CT head report was reviewed. Thiamine was initiated.   Acute metabolic encephalopathy  -Most likely multifactorial to include acute ICH vs UTI -Now appears back to baseline  Acute on chronic hypercarbic respiratory failure 2/2 presumed overdose  -Resolved -Hx COPD without evidence of acute exacerbation -Patient does not use home oxygen  Sinus bradycardia -Resolved, Most likely secondary to beta blocker overdose  Hypotension  -Resolved  Hypertension -Stable, Continue amlodipine and PRN  hydralazine  Hyponatremia -Resolved  Protein calorie malnutrition -Diet regular with ensure between meals  Nicotine abuse -Continue nicotine patch  Nodular Lesions on Right Leg -Discussed with husband, patient will need to followup with dermatology as an outpatient. -Unknown etiology, ? Sebaceous cyst  Code Status: DNR   Family Communication: Husband at bedside  Disposition Plan: Admitted, likely d/c to SNF on 12/31  Time Spent in minutes   45 minutes minutes  Procedures  EGD Endoscopy with dilatation  Consults   PCCM Gastroenterology Neurosurgery Psychiatry  DVT Prophylaxis   SCDs  Lab Results  Component Value Date   PLT 250 09/19/2014    Medications  Scheduled Meds: . amLODipine  5 mg Oral Daily  . antiseptic oral rinse  7 mL Mouth Rinse q12n4p  . aspirin  81 mg Oral Daily  . brinzolamide  1 drop Both Eyes Daily  . chlordiazePOXIDE  5 mg Oral Daily  . chlorhexidine  15 mL Mouth Rinse BID  . feeding supplement (ENSURE COMPLETE)  237 mL Oral Q24H  . latanoprost  1 drop Both Eyes QHS  . multivitamin with minerals  1 tablet Oral Daily  . nicotine  14 mg Transdermal Q24H  . pantoprazole  40 mg Oral QHS  . sodium chloride  3 mL Intravenous Q12H  . thiamine  100 mg Oral Daily   Continuous Infusions:  PRN Meds:.sodium chloride, albuterol, fentaNYL, hydrALAZINE, loperamide, ondansetron (ZOFRAN) IV, sodium chloride  Antibiotics    Anti-infectives    Start     Dose/Rate Route Frequency Ordered Stop   09/10/14 1300  cefTRIAXone (ROCEPHIN) 1 g in dextrose 5 % 50 mL IVPB - Premix  Status:  Discontinued     1 g100 mL/hr over 30 Minutes Intravenous Every 24 hours 09/10/14 1158 09/15/14 1121   09/10/14 1000  cephALEXin (KEFLEX) capsule 500 mg  Status:  Discontinued     500 mg Oral Every 12 hours 09/10/14 0654 09/10/14 1125        Subjective:   Laurie Horne seen and examined today.  Patient does not feel that she is rated to be discharged today. She denies any chest pain shortness of breath or abdominal pain at this time   Objective:   Filed Vitals:   09/22/14 1004 09/22/14 1005 09/22/14 1010 09/22/14 1044  BP: 141/80 148/80 152/83   Pulse: 81 81 80   Temp: 98 F (36.7 C)     TempSrc: Oral     Resp: 26 19 15    Height:      Weight:      SpO2: 100% 100% 100% 100%    Wt Readings from Last 3 Encounters:  09/22/14 40.189 kg (88 lb 9.6 oz)  11/27/13 39.7 kg (87 lb 8.4 oz)    No intake or output data in the 24 hours ending 09/22/14 1406  Exam  General: Well developed, thin, NAD, appears stated age  HEENT: NCAT, mucous membranes moist.    Cardiovascular: S1 S2 auscultated, no rubs, murmurs or gallops. Regular rate and rhythm.  Respiratory: Clear to auscultation bilaterally with equal chest rise  Abdomen: Soft, nontender, nondistended, + bowel sounds  Extremities: warm dry without cyanosis clubbing or edema  Data Review   Micro Results No results found for this or any previous visit (from the past 240 hour(s)).  Radiology Reports Ct Head Wo Contrast  09/15/2014   CLINICAL DATA:  Intracranial hemorrhage.  Follow-up examination.  EXAM: CT HEAD WITHOUT CONTRAST  TECHNIQUE: Contiguous axial images were obtained from the  base of the skull through the vertex without intravenous contrast.  COMPARISON:  Prior CT from 09/07/2014.  FINDINGS: Previously seen intraventricular hemorrhage involving the lateral ventricles has resolved as compared to prior exam. No definite hemorrhage now identified. Overall ventricular size is slightly increased in size as compared to prior study without frank hydrocephalus. No midline shift. Basilar cisterns remain patent.  No acute large vessel territory infarct. No new intracranial hemorrhage. No extra-axial fluid collection. Chronic small vessel ischemic changes again noted low involving the periventricular and deep white matter.  Scalp soft tissues within normal limits. Forehead contusion has resolved. No acute abnormality seen about the orbits.  Calvarium intact.  Paranasal sinuses and mastoid air cells well pneumatized and free of fluid.  IMPRESSION: 1. Interval resolution of previously seen intraventricular hemorrhage. No new intracranial hemorrhage. 2. Slight interval increase in ventricular size as compared to prior study without frank hydrocephalus. 3. Moderate chronic small vessel ischemic disease, stable.   Electronically Signed   By: Rise Mu M.D.   On: 09/15/2014 22:55   Ct Head Wo Contrast  09/07/2014   CLINICAL DATA:  Altered mental status  EXAM: CT HEAD WITHOUT CONTRAST   TECHNIQUE: Contiguous axial images were obtained from the base of the skull through the vertex without intravenous contrast.  COMPARISON:  11/21/2013  FINDINGS: Skull and Sinuses:There is central forehead contusion without underlying fracture.  Orbits: No acute abnormality.  Brain: New high-density present within the atria of the lateral ventricles and within the right lateral ventricle body. The hemorrhage is small volume, with the largest hematoma measuring 17 mm (in the right lateral ventricular body). There is no hydrocephalus or subarachnoid extension. No brain swelling or evidence of infarct.  Generalized cerebral volume loss which typical for age. Diffuse, patchy cerebral white matter low density which is similar to previous and likely from chronic small vessel disease.  Critical Value/emergent results were called by telephone at the time of interpretation on 09/07/2014 at 2:54 am to Dr. Marisa Severin , who verbally acknowledged these results.  IMPRESSION: 1. Acute hemorrhage in the lateral ventricles without hydrocephalus. 2. Forehead contusion without fracture. 3. Chronic white matter disease and mild brain atrophy.   Electronically Signed   By: Tiburcio Pea M.D.   On: 09/07/2014 02:54   Dg Esophagus  09/15/2014   CLINICAL DATA:  80 year old female with difficulty swallowing pills. Remote history of esophageal dilation. Initial encounter.  EXAM: ESOPHOGRAM/BARIUM SWALLOW  TECHNIQUE: Single contrast examination was performed using thin barium and barium pill.  FLUOROSCOPY TIME:  54 seconds.  COMPARISON:  None.  FINDINGS: No aspiration or laryngeal penetration.  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 When the patient is ingested a 13 mm barium tablet, this becomes lodged at this level and only clears with subsequent swelling.  Impression upon the upper thoracic esophagus by calcified tortuous aorta.  Smooth narrowing of the distal esophagus spanning over 6 cm. 13 mm  barium tablet becomes lodged at this level and only clears after it was partially dissolved.  Mild presbyesophagus.  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 When the patient is ingested a 13 mm barium tablet, this becomes lodged at this level and only clears with subsequent swelling.  Smooth narrowing of the distal esophagus spanning over 6 cm. 13 mm barium tablet becomes lodged at this level and only clears after it was partially dissolved.  Mild presbyesophagus.   Electronically Signed   By: Kandice Hams.D.  On: 09/15/2014 16:02   Dg Chest Port 1 View  09/08/2014   CLINICAL DATA:  Intracranial hemorrhage.  EXAM: PORTABLE CHEST - 1 VIEW  COMPARISON:  09/07/2014.  FINDINGS: Trachea is midline. Heart size stable. Thoracic aorta is calcified. Biapical pleural thickening. Lungs are hyperinflated. Suspect mild basilar predominant interstitial prominence with peripheral septal lines. No definite pleural fluid.  IMPRESSION: Suspect mild pulmonary edema.   Electronically Signed   By: Leanna BattlesMelinda  Blietz M.D.   On: 09/08/2014 08:02   Dg Chest Port 1 View  09/07/2014   CLINICAL DATA:  Transient altered awareness.  EXAM: PORTABLE CHEST - 1 VIEW  COMPARISON:  November 23, 2013.  FINDINGS: Stable cardiomegaly. No pneumothorax or pleural effusion is noted. Both lungs are clear. The visualized skeletal structures are unremarkable.  IMPRESSION: No acute cardiopulmonary abnormality seen.   Electronically Signed   By: Roque LiasJames  Green M.D.   On: 09/07/2014 02:12   Dg Swallowing Func-speech Pathology  09/20/2014   Rod MaePatricia A Adams, CCC-SLP     09/20/2014  2:40 PM Objective Swallowing Evaluation: Modified Barium Swallowing Study   Patient Details  Name: Wonda ChengJoan C Shrum MRN: [id number] Date of Birth: 03/11/1934  Today's Date: 09/20/2014 Time: 0115-0130 SLP Time Calculation (min) (ACUTE ONLY): 15 min  Past Medical History:  Past Medical History  Diagnosis Date  . Hyperlipidemia   .  Hypertension   . History of tobacco abuse    Past Surgical History:  Past Surgical History  Procedure Laterality Date  . Tonsillectomy and adenoidectomy    . Appendectomy    . Childbirth      x 3  . Esophagogastroduodenoscopy N/A 09/18/2014    Procedure: ESOPHAGOGASTRODUODENOSCOPY (EGD);  Surgeon: Shirley FriarVincent  C. Schooler, MD;  [facility];  Service: Endoscopy;   Laterality: N/A;  . Balloon dilation N/A 09/18/2014    Procedure: BALLOON DILATION;  Surgeon: Shirley FriarVincent C. Schooler, MD;   [facility];  Service: Endoscopy;  Laterality: N/A;  . Savory dilation N/A 09/18/2014    Procedure: SAVORY DILATION;  Surgeon: Shirley FriarVincent C. Schooler, MD;   [facility];  Service: Endoscopy;  Laterality: N/A;   no fluro needed   HPI:  80 year old female with history of HTN, everyday smoker, and  presumed COPD. [facility] falls with  acute respiratory failure. She was intubated in ICU and  eventually discharged to SNF. Family reports that she has been  anorexic for about the past 5 years and barely eats. After being  discharged from SNF her USOH was significantly diminished when  compared to that of before her hospitalization. Husband states  that she has not been herself for some time. Over the past  several months her abilities have been reduced. She can barely  walk anymore according to her husband and has had several falls  over the last 7 days. Her husband states that she has struck her  head hard several times while falling. He is also concerned that  she has been overdosing on her prescribed medications, he is not  sure if this is intentional or not. For that reason he has been  assisting her with administration. 09/06/14 he thinks she took  her meds again after he gave them to her because he found her  unresponsive on the couch in the PM. [facility]  [facility] and was noted to be in hypercarbic respiratory failure  so she was placed on BiPAP. CT of the  head was  performed on  09/07/14, which showed acute intracranial hemorrhage. Her mental  status remains diminished. EGD completed on 09/18/14 and  Esophagram on 09/15/14 indicated prominent cricopharyngeal bar  with marked narrowing of the cervical esophagus .  MBS ordered to assess swallowing capacity and to  eliminate oropharyngeal issues related to dysphagia.     Assessment / Plan / Recommendation Clinical Impression  Dysphagia Diagnosis: Suspected primary esophageal dysphagia;  mild pharyngeal phase dysphagia; pt exhibited mild  vallecular/pyriform residue which cleared with subsequent swallow  with puree-solid consistencies; premature loss into vallecular  space noted with larger amounts of thin liquids probable d/t  presbyphagia; no aspiration or penetration noted throughout  study; recommend Regular diet (as tolerated) with esophageal  precautions and double swallow prn to clear residue in pharynx  during meals.    Treatment Recommendation  Therapy as outlined in treatment plan below    Diet Recommendation Regular;Thin liquid (as tolerated with  esophageal precautions; double swallow prn)   Liquid Administration via: Cup;Straw Medication Administration: Whole meds with puree (prn) Supervision: Patient able to self feed Compensations: Slow rate;Small sips/bites;Follow solids with  liquid (double swallow prn) Postural Changes and/or Swallow Maneuvers: Seated upright 90  degrees    Other  Recommendations Recommended Consults: Consider esophageal  assessment Oral Care Recommendations: Oral care BID   Follow Up Recommendations  Other (comment) (SLE)    Frequency and Duration min 1 x/week  1 week   Pertinent Vitals/Pain WDL    SLP Swallow Goals  See POC   General Date of Onset: 09/06/14 HPI: 80 year old female with history of HTN, everyday smoker, and  presumed COPD. She was admitted to [facility] in 10/2013 after falls with  acute respiratory failure. She was intubated in ICU and  eventually discharged to SNF. Family reports that  she has been  anorexic for about the past 5 years and barely eats. After being  discharged from SNF her USOH was significantly diminished when  compared to that of before her hospitalization. Husband states  that she has not been herself for some time. Over the past  several months her abilities have been reduced. She can barely  walk anymore according to her husband and has had several falls  over the last 7 days. Her husband states that she has struck her  head hard several times while falling. He is also concerned that  she has been overdosing on her prescribed medications, he is not  sure if this is intentional or not. For that reason he has been  assisting her with administration. 09/06/14 he thinks she took  her meds again after he gave them to her because he found her  unresponsive on the couch in the PM. She was brought to [facility] ED on  09/07/14 and was noted to be in hypercarbic respiratory failure  so she was placed on BiPAP. CT of the head was performed on  09/07/14, which showed acute intracranial hemorrhage. Her mental  status remains diminished. EGD scheduled for 09/18/14, but not  completed.  MBS ordered to assess swallowing capacity and to  eliminate oralpharyngeal issues relating to dysphagia. Type of Study: Modified Barium Swallowing Study Reason for Referral: Objectively evaluate swallowing function Diet Prior to this Study: Thin liquids Temperature Spikes Noted: No Respiratory Status: Nasal cannula History of Recent Intubation: No Behavior/Cognition: Alert;Cooperative;Distractible;Requires  cueing Oral Cavity - Dentition: Adequate natural dentition Oral Motor / Sensory Function: Within functional limits Self-Feeding Abilities: Able to feed self Patient Positioning: Upright  in chair Baseline Vocal Quality: Clear Volitional Cough: Strong Volitional Swallow: Able to elicit Anatomy: Within functional limits Pharyngeal Secretions: Not observed secondary MBS    Reason for Referral Objectively evaluate  swallowing function   Oral Phase Oral Preparation/Oral Phase Oral Phase: WFL   Pharyngeal Phase Pharyngeal Phase Pharyngeal Phase: Impaired Pharyngeal - Solids Pharyngeal - Puree: Pharyngeal residue - pyriform  sinuses;Pharyngeal residue - valleculae Pharyngeal - Regular: Pharyngeal residue - valleculae;Pharyngeal  residue - pyriform sinuses Pharyngeal Phase - Comment Pharyngeal Comment: material cleared with subsequent swallows  Cervical Esophageal Phase        Cervical Esophageal Phase Cervical Esophageal Phase: [facility] Cervical Esophageal Phase - Comment Cervical Esophageal Comment: Residue in valleculae/pyriform  sinuses noted after swallow; cleared with subsequent swallow    Functional Assessment Tool Used: NOMS Functional Limitations: Swallowing Swallow Current Status ([id number]): At least 1 percent but less than  20 percent impaired, limited or restricted Swallow Goal Status [phone]): At least 1 percent but less than 20  percent impaired, limited or restricted Swallow Discharge Status [phone]([id number]): At least 1 percent but less  than 20 percent impaired, limited or restricted    ADAMS,PAT, M.S., CCC-SLP 09/20/2014, 2:11 PM     CBC  Recent Labs Lab 09/16/14 0508 09/19/14 0509  WBC 8.2 14.5*  HGB 14.0 13.2  HCT 44.2 42.3  PLT 223 250  MCV 91.1 93.0  MCH 28.9 29.0  MCHC 31.7 31.2  RDW 12.9 13.2    Chemistries   Recent Labs Lab 09/16/14 0508 09/19/14 0509  NA 132* 133*  K 4.8 4.7  CL 86* 93*  CO2 39* 33*  GLUCOSE 92 94  BUN 6 <5*  CREATININE 0.50 0.37*  CALCIUM 9.4 9.1   ------------------------------------------------------------------------------------------------------------------ estimated creatinine clearance is 36.8 mL/min (by C-G formula based on Cr of 0.37). ------------------------------------------------------------------------------------------------------------------ No results for input(s): HGBA1C in the last 72  hours. ------------------------------------------------------------------------------------------------------------------ No results for input(s): CHOL, HDL, LDLCALC, TRIG, CHOLHDL, LDLDIRECT in the last 72 hours. ------------------------------------------------------------------------------------------------------------------ No results for input(s): TSH, T4TOTAL, T3FREE, THYROIDAB in the last 72 hours.  Invalid input(s): FREET3 ------------------------------------------------------------------------------------------------------------------ No results for input(s): VITAMINB12, FOLATE, FERRITIN, TIBC, IRON, RETICCTPCT in the last 72 hours.  Coagulation profile No results for input(s): INR, PROTIME in the last 168 hours.  No results for input(s): DDIMER in the last 72 hours.  Cardiac Enzymes No results for input(s): CKMB, TROPONINI, MYOGLOBIN in the last 168 hours.  Invalid input(s): CK ------------------------------------------------------------------------------------------------------------------ Invalid input(s): POCBNP    Hennesy Sobalvarro D.O. on 09/22/2014 at 2:06 PM  Between 7am to 7pm - Pager - [phone]  After 7pm go to [email or web address] - password [email or web address]  And look for the night coverage person covering for me after hours  [facility] Office  [phone]

## 2014-09-22 NOTE — Anesthesia Postprocedure Evaluation (Signed)
  Anesthesia Post-op Note  Patient: Laurie Horne  Procedure(s) Performed: Procedure(s): SAVORY DILATION (N/A) ESOPHAGOGASTRODUODENOSCOPY (EGD) WITH PROPOFOL (N/A)  Patient Location: Endoscopy Unit  Anesthesia Type:MAC  Level of Consciousness: awake, alert  and oriented  Airway and Oxygen Therapy: Patient Spontanous Breathing  Post-op Pain: none  Post-op Assessment: Post-op Vital signs reviewed  Post-op Vital Signs: Reviewed and stable  Last Vitals:  Filed Vitals:   09/22/14 0836  BP: 152/90  Pulse: 88  Temp: 36.7 C  Resp: 22    Complications: No apparent anesthesia complications

## 2014-09-22 NOTE — Op Note (Signed)
[facility] [address] [place], [id number]   ENDOSCOPY WITH DILATION PROCEDURE REPORT  PATIENT: Laurie Horne, Laurie Horne  MR#: [id number] BIRTHDATE: 13-Apr-1934 , 80  yrs. old GENDER: female ENDOSCOPIST: Carman ChingJames Tyna Huertas, MD ASSISTANT:   Billups, Verlon SettingJanie Hinson, Delia ChimesKaren Thacker, Toma CopierBethany REFERRED BY: [facility] hospitalist PROCEDURE DATE:  09/22/2014 PROCEDURE:   Savary dilation of esophagus ASA CLASS:   Class II INDICATIONS:patient with dysphagia with modified barium swallow suggesting possible esophageal problem.  Previous EGD showed difficulty passing scope in the proximal esophagus.Marland Kitchen. MEDICATIONS: Propofol 120 IV and Fentanyl 100 mcg IV TOPICAL ANESTHETIC:  DESCRIPTION OF PROCEDURE:   After the risks benefits and alternatives of the procedure were thoroughly explained, informed consent was obtained.  The Pentax Gastroscope [id number]  endoscope was introduced through the mouth  and advanced to the second portion of the duodenum , limited by Without limitations.   The instrument was slowly withdrawn as the mucosa was carefully examined.      ESOPHAGUS: No gross esophageal lesions noted.  We did use the pediatric scope based on difficulty passing the adults scope on previous EGD's.  Esophageal lumen appeared widely patent throughout the entire course of the esophagus.  There was some pressure passing in the upper esophagus.  No gross lesions were seen in the esophagus.  STOMACH: The mucosa of the stomach appeared normal.  DUODENUM: The duodenal mucosa showed no abnormalities.  Dilation was then performed at the proximal esophagus  Dilator:Savary over guidewire Size:11 mm, 12 mm, 12.8 mm, 14 mm Reststance:none Heme:none  minimal resistance with passage of dilators.  Fluoroscopy used to observe dilators passing across the diaphragm.  After passage of the 14 mm dilator, dilator and guidewire were withdrawn as a unit.  COMPLICATIONS: There were no immediate  complications.  ENDOSCOPIC IMPRESSION: 1.   No gross esophageal lesions noted.  We did use the pediatric scope based on difficulty passing the adults scope on previous EGD's.  Esophageal lumen appeared widely patent throughout the entire course of the esophagus.  There was some pressure passing in the upper esophagus.  No gross lesions were seen in the esophagus 2.   The mucosa of the stomach appeared normal 3.   The duodenal mucosa showed no abnormalities 4.    Dilated to 14 mm with minimal findings on EGD. 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  RECOMMENDATIONS: Routine post dilatation orders clear liquids today soft foods tonight  eSigned:  Carman ChingJames Annet Manukyan, MD 09/22/2014 10:12 AM  CC:  CPT CODES: ICD CODES:  The ICD and CPT codes recommended by this software are interpretations from the data that the clinical staff has captured with the software.  The verification of the translation of this report to the ICD and CPT codes and modifiers is the sole responsibility of the health care institution and practicing physician where this report was generated.  [employer] will not be held responsible for the validity of the ICD and CPT codes included on this report.  AMA assumes no liability for data contained or not contained herein. CPT is a Publishing rights managerregistered trademark of the [employer].  PATIENT NAME:  Laurie Horne, Laurie Horne MR#: [id number]

## 2014-09-22 NOTE — Progress Notes (Signed)
OT NOTE  OT stopped by spouse requesting to speak with Dr Jama Flavorsobos. SW Almira CoasterGina contacted and informed of family need to speak with MD. Spouse provide number to behavioral health [phone] and MD name Dr Jama Flavorsobos to help facilitate personal discussion. Spouse does not want to disclose any information to any staff other than Dr Jama Flavorsobos. Spouse only states "i dont want to tell it but I think he really really needs to know."   Laurie Horne, Laurie Horne   OTR/L Pager: [phone] Office: [phone] .

## 2014-09-22 NOTE — Progress Notes (Signed)
Speech Language Pathology Treatment: Dysphagia  Patient Details Name: Laurie ChengJoan C Thomason MRN: [id number] DOB: 10/25/1933 Today's Date: 09/22/2014 Time: [id number] SLP Time Calculation (min) (ACUTE ONLY): 10 min  Assessment / Plan / Recommendation Clinical Impression  Pt seen for f/u swallow treatment. Pt currently on clear liquids s/p EGD with dilation this morning. Pt consumed straw sips of thin liquids with no overt signs of aspiration without cueing needed from SLP. Pt appears to be tolerating current diet well. Will f/u an additional time once solid PO has been resumed.   HPI HPI: 80 year old female with history of HTN, everyday smoker, and presumed COPD. [facility] after falls with acute respiratory failure. She was intubated in ICU and eventually discharged to SNF. Family reports that she has been anorexic for about the past 5 years and barely eats. After being discharged from SNF her USOH was significantly diminished when compared to that of before her hospitalization. Husband states that she has not been herself for some time. Over the past several months her abilities have been reduced. She can barely walk anymore according to her husband and has had several falls over the last 7 days. Her husband states that she has struck her head hard several times while falling. He is also concerned that she has been overdosing on her prescribed medications, he is not sure if this is intentional or not. For that reason he has been assisting her with administration. 09/06/14 he thinks she took her meds again after he gave them to her because he found her unresponsive on the couch in the PM. She was brought to [facility] ED on 09/07/14 and was noted to be in hypercarbic respiratory failure so she was placed on BiPAP. CT of the head was performed on 09/07/14, which showed acute intracranial hemorrhage. Her mental status remains diminished. EGD scheduled for 09/18/14, but not completed.  MBS ordered to  assess swallowing capacity and to eliminate oralpharyngeal issues relating to dysphagia.   Pertinent Vitals Pain Assessment: No/denies pain  SLP Plan  Continue with current plan of care    Recommendations Diet recommendations: Thin liquid (advance as tolerated per MD) Liquids provided via: Cup;Straw Medication Administration: Whole meds with puree Supervision: Patient able to self feed Compensations: Slow rate;Small sips/bites;Follow solids with liquid Postural Changes and/or Swallow Maneuvers: Seated upright 90 degrees;Upright 30-60 min after meal              Oral Care Recommendations: Oral care BID Follow up Recommendations: None Plan: Continue with current plan of care    GO      Maxcine HamLaura Paiewonsky, M.A. CCC-SLP [phone]  Maxcine Hamaiewonsky, Dartanion Teo 09/22/2014, 2:27 PM

## 2014-09-23 ENCOUNTER — Encounter (HOSPITAL_COMMUNITY): Payer: Self-pay | Admitting: Gastroenterology

## 2014-09-23 MED ORDER — PANTOPRAZOLE SODIUM 40 MG PO TBEC: 40.0000 mg | DELAYED_RELEASE_TABLET | Freq: Every day | ORAL | Status: DC

## 2014-09-23 MED ORDER — NICOTINE 14 MG/24HR TD PT24: 14.0000 mg | MEDICATED_PATCH | TRANSDERMAL | Status: DC

## 2014-09-23 MED ORDER — LOPERAMIDE HCL 2 MG PO CAPS: 2.0000 mg | ORAL_CAPSULE | ORAL | Status: DC | PRN

## 2014-09-23 MED ORDER — AMLODIPINE BESYLATE 5 MG PO TABS: 5.0000 mg | ORAL_TABLET | Freq: Every day | ORAL | Status: AC

## 2014-09-23 MED ORDER — THIAMINE HCL 100 MG PO TABS: 100.0000 mg | ORAL_TABLET | Freq: Every day | ORAL | Status: DC

## 2014-09-23 NOTE — Progress Notes (Signed)
Report given to Cordelia PenSherry, [facility] at [facility].  Pt to be transported to facility per ambulance as arranged by SW at approx 1130. Will monitor   Andrew AuVafiadis, Sallyanne Birkhead I 09/23/2014 11:09 AM

## 2014-09-23 NOTE — Progress Notes (Signed)
Pt transported out of unit per stretcher by ambulance personnel. No acute distress noted. Family member at side with pt's personal belongings.   Andrew AuVafiadis, Isiah Scheel I 09/23/2014 11:47 AM

## 2014-09-23 NOTE — Clinical Social Work Note (Signed)
CSW met pt and her husband at bedside. CSW informed the pt that she will be discharge today. CSW and pt discussed ambulance transport. CSW contact [facility] at [phone] to schedule transport for the pt. CSW upload the pt's discharge summary to [facility]. Bedside RN given number to call report.   [place], MSW, [place]

## 2014-09-23 NOTE — Discharge Summary (Signed)
Physician Discharge Summary  Laurie Horne WUJ:[id number] DOB: 08-27-1934 DOA: 09/06/2014  PCP: No primary care provider on file.  Admit date: 09/06/2014 Discharge date: 09/23/2014  Time spent: 45 minutes  Recommendations for Outpatient Follow-up:  Patient will be discharged to [facility] nursing facility. She should continue physicals with occupational therapy as recommended by the facility. Patient also follow-up with her primary care physician within 1-2 weeks of discharge. Patient should follow-up with gastroenterology. She will also need to followup with neurology.  Patient to continue taking her medications as prescribed. Patient should follow a heart healthy diet.   Discharge Diagnoses:  Traumatic ICH in the setting of frequent falls Dysphagia Depression/cognitive impairment/unintentional drug overdose Acute metabolic encephalopathy Acute on chronic hypercarbic respiratory failure secondary to presumed overdose Sinus bradycardia Hypotension Hypertension Hyponatremia Protein calorie malnutrition Nodular lesion on the right leg Nicotine abuse  Discharge Condition: Stable  Diet recommendation: Heart healthy  Filed Weights   09/21/14 0500 09/22/14 0700 09/23/14 0500  Weight: 40.96 kg (90 lb 4.8 oz) 40.189 kg (88 lb 9.6 oz) 40.189 kg (88 lb 9.6 oz)    History of present illness:  on 09/07/2014 by Dr. Billy Fischer 80 year old female with history of HTN, everyday smoker, and presumed COPD. She was admitted to [facility] in 10/2013 after falls with acute respiratory failure. She was intubated in ICU and eventually discharged to SNF. Family reports that she has been anorexic for about the past 5 years and barely eats. She chews her food and spits it out. Watching her weight has been very important to her. Weight in ED is 87lbs. After being discharged from SNF her USOH was significantly diminished when compared to that of before her hospitalization. Husband states that she has not been  herself for some time. Over the past several months her abilities have been reduced. She can barely walk anymore according to her husband and has had several falls over the last 7 days. Her husband states that she has struck her head hard several times while falling. He is also concerned that she has been overdosing on her prescribed medications, he is not sure if this is intentional or not. For that reason he has been assisting her with administration. 12/15 he thinks she took her meds again after he gave them to her because he found her unresponsive on the cough in the PM. She was brought to [facility] ED and was noted to be in hypercarbic respiratory failure so she was placed on BiPAP. CT of the head was performed, which showed acute intracranial hemorrhage. Her mental status remains diminished. She was also bradycardic in ED. With concern that she overdosed on her home beta-blocker. She was given glucagon in ED with minimal response.   Hospital Course:  Traumatic ICH in setting of frequent falls.  -Per neurosurgery no further diagnostic/therapeutic modalities required. -PT/OT recommends SNF  Dysphagia -Had EGD 12/26: mild to moderate proximal esophageal narrowing -Barium swallow: esophageal dysphagia -Patient had EGD with dilatation 09/22/2014 -Patient able to tolerate soft regular diet, should be placed on heart healthy  Depression/cognitive impairment/unintentional drug overdose -Significant short-term memory loss -She was re-evaluated by psychiatry. She doesn't need to go to any psychiatric facility at this time. -Patient will need to follow up with Dr. Jama Flavors as an outpatient -Drug overdose was likely unintentional. Cognitive impairment may have contributed. -Appreciate neurology input. Repeat CT head report was reviewed. Thiamine was initiated.   Acute metabolic encephalopathy  -Most likely multifactorial to include acute ICH vs UTI -Now  appears back to baseline  Acute on chronic hypercarbic  respiratory failure 2/2 presumed overdose  -Resolved -Hx COPD without evidence of acute exacerbation -Patient does not use home oxygen  Sinus bradycardia -Resolved, Most likely secondary to beta blocker overdose  Hypotension  -Resolved  Hypertension -Stable, Continue amlodipine and PRN hydralazine  Hyponatremia -Resolved  Protein calorie malnutrition -Diet regular with ensure between meals  Nicotine abuse -Continue nicotine patch  Nodular Lesions on Right Leg -Discussed with husband, patient will need to followup with dermatology as an outpatient. -Unknown etiology, ? Sebaceous cyst  Code Status: DNR   Procedures  EGD Endoscopy with dilatation  Consults  PCCM Gastroenterology Neurosurgery Psychiatry  Discharge Exam: Filed Vitals:   09/23/14 0935  BP: 147/86  Pulse: 86  Temp: 98 F (36.7 C)  Resp: 18     General: Well developed, thin, NAD, appears stated age  HEENT: NCAT, mucous membranes moist.  Cardiovascular: S1 S2 auscultated, no rubs, murmurs or gallops. Regular rate and rhythm.  Respiratory: Clear to auscultation bilaterally with equal chest rise  Abdomen: Soft, nontender, nondistended, + bowel sounds  Extremities: warm dry without cyanosis clubbing or edema   Discharge Instructions      Discharge Instructions    Discharge instructions    Complete by:  As directed   Patient will be discharged to [facility] nursing facility. She should continue physicals with occupational therapy as recommended by the facility. Patient also follow-up with her primary care physician within 1-2 weeks of discharge. Patient should follow-up with gastroenterology. She will also need to followup with neurology.  Patient to continue taking her medications as prescribed. Patient should follow a heart healthy diet.            Medication List    STOP taking these medications        ALPRAZolam 0.25 MG tablet  Commonly known as:  XANAX      bisoprolol-hydrochlorothiazide 2.5-6.25 MG per tablet  Commonly known as:  ZIAC     meclizine 12.5 MG tablet  Commonly known as:  ANTIVERT     predniSONE 10 MG tablet  Commonly known as:  DELTASONE     ramipril 10 MG capsule  Commonly known as:  ALTACE      TAKE these medications        albuterol (2.5 MG/3ML) 0.083% nebulizer solution  Commonly known as:  PROVENTIL  Take 3 mLs (2.5 mg total) by nebulization every 3 (three) hours as needed for wheezing or shortness of breath.     amLODipine 5 MG tablet  Commonly known as:  NORVASC  Take 1 tablet (5 mg total) by mouth daily.     aspirin 81 MG tablet  Take 81 mg by mouth daily.     brinzolamide 1 % ophthalmic suspension  Commonly known as:  AZOPT  Place 1 drop into both eyes every morning. As directed     feeding supplement (ENSURE COMPLETE) Liqd  Take 237 mLs by mouth 2 (two) times daily between meals.     ipratropium-albuterol 0.5-2.5 (3) MG/3ML Soln  Commonly known as:  DUONEB  Take 3 mLs by nebulization every 6 (six) hours.     loperamide 2 MG capsule  Commonly known as:  IMODIUM  Take 1 capsule (2 mg total) by mouth as needed for diarrhea or loose stools.     multivitamin with minerals Tabs tablet  Take 1 tablet by mouth daily.     nicotine 14 mg/24hr patch  Commonly known as:  NICODERM  CQ - dosed in mg/24 hours  Place 1 patch (14 mg total) onto the skin daily.     pantoprazole 40 MG tablet  Commonly known as:  PROTONIX  Take 1 tablet (40 mg total) by mouth at bedtime.     thiamine 100 MG tablet  Take 1 tablet (100 mg total) by mouth daily.     Travoprost (BAK Free) 0.004 % Soln ophthalmic solution  Commonly known as:  TRAVATAN  Place 1 drop into both eyes at bedtime. As directed       Allergies  Allergen Reactions  . Codeine Other (See Comments)    unknown   Follow-up Information    Follow up with Primary care physician. Schedule an appointment as soon as possible for a visit in 1 week.   Why:   Hospital followup      Follow up with [facility]. Schedule an appointment as soon as possible for a visit in 1 month.   Specialty:  Neurology   Why:  Hospital followup   Contact information:   [address] [place] [address] [phone]      Follow up with Vertell Novak, MD In 1 month.   Specialty:  Gastroenterology   Why:  Hospital followup   Contact information:   [address] [place] [address] [phone]        The results of significant diagnostics from this hospitalization (including imaging, microbiology, ancillary and laboratory) are listed below for reference.    Significant Diagnostic Studies: Ct Head Wo Contrast  09/15/2014   CLINICAL DATA:  Intracranial hemorrhage.  Follow-up examination.  EXAM: CT HEAD WITHOUT CONTRAST  TECHNIQUE: Contiguous axial images were obtained from the base of the skull through the vertex without intravenous contrast.  COMPARISON:  Prior CT from 09/07/2014.  FINDINGS: Previously seen intraventricular hemorrhage involving the lateral ventricles has resolved as compared to prior exam. No definite hemorrhage now identified. Overall ventricular size is slightly increased in size as compared to prior study without frank hydrocephalus. No midline shift. Basilar cisterns remain patent.  No acute large vessel territory infarct. No new intracranial hemorrhage. No extra-axial fluid collection. Chronic small vessel ischemic changes again noted low involving the periventricular and deep white matter.  Scalp soft tissues within normal limits. Forehead contusion has resolved. No acute abnormality seen about the orbits.  Calvarium intact.  Paranasal sinuses and mastoid air cells well pneumatized and free of fluid.  IMPRESSION: 1. Interval resolution of previously seen intraventricular hemorrhage. No new intracranial hemorrhage. 2. Slight interval increase in ventricular size as compared to prior  study without frank hydrocephalus. 3. Moderate chronic small vessel ischemic disease, stable.   Electronically Signed   By: Rise Mu M.D.   On: 09/15/2014 22:55   Ct Head Wo Contrast  09/07/2014   CLINICAL DATA:  Altered mental status  EXAM: CT HEAD WITHOUT CONTRAST  TECHNIQUE: Contiguous axial images were obtained from the base of the skull through the vertex without intravenous contrast.  COMPARISON:  11/21/2013  FINDINGS: Skull and Sinuses:There is central forehead contusion without underlying fracture.  Orbits: No acute abnormality.  Brain: New high-density present within the atria of the lateral ventricles and within the right lateral ventricle body. The hemorrhage is small volume, with the largest hematoma measuring 17 mm (in the right lateral ventricular body). There is no hydrocephalus or subarachnoid extension. No brain swelling or evidence of infarct.  Generalized cerebral volume loss which typical for age.  Diffuse, patchy cerebral white matter low density which is similar to previous and likely from chronic small vessel disease.  Critical Value/emergent results were called by telephone at the time of interpretation on 09/07/2014 at 2:54 am to Dr. Marisa Severin , who verbally acknowledged these results.  IMPRESSION: 1. Acute hemorrhage in the lateral ventricles without hydrocephalus. 2. Forehead contusion without fracture. 3. Chronic white matter disease and mild brain atrophy.   Electronically Signed   By: Tiburcio Pea M.D.   On: 09/07/2014 02:54   Dg Esophagus  09/15/2014   CLINICAL DATA:  80 year old female with difficulty swallowing pills. Remote history of esophageal dilation. Initial encounter.  EXAM: ESOPHOGRAM/BARIUM SWALLOW  TECHNIQUE: Single contrast examination was performed using thin barium and barium pill.  FLUOROSCOPY TIME:  54 seconds.  COMPARISON:  None.  FINDINGS: No aspiration or laryngeal penetration.  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 When the patient is ingested a 13 mm barium tablet, this becomes lodged at this level and only clears with subsequent swelling.  Impression upon the upper thoracic esophagus by calcified tortuous aorta.  Smooth narrowing of the distal esophagus spanning over 6 cm. 13 mm barium tablet becomes lodged at this level and only clears after it was partially dissolved.  Mild presbyesophagus.  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 When the patient is ingested a 13 mm barium tablet, this becomes lodged at this level and only clears with subsequent swelling.  Smooth narrowing of the distal esophagus spanning over 6 cm. 13 mm barium tablet becomes lodged at this level and only clears after it was partially dissolved.  Mild presbyesophagus.   Electronically Signed   By: Bridgett Larsson M.D.   On: 09/15/2014 16:02   Dg Chest Port 1 View  09/08/2014   CLINICAL DATA:  Intracranial hemorrhage.  EXAM: PORTABLE CHEST - 1 VIEW  COMPARISON:  09/07/2014.  FINDINGS: Trachea is midline. Heart size stable. Thoracic aorta is calcified. Biapical pleural thickening. Lungs are hyperinflated. Suspect mild basilar predominant interstitial prominence with peripheral septal lines. No definite pleural fluid.  IMPRESSION: Suspect mild pulmonary edema.   Electronically Signed   By: Leanna Battles M.D.   On: 09/08/2014 08:02   Dg Chest Port 1 View  09/07/2014   CLINICAL DATA:  Transient altered awareness.  EXAM: PORTABLE CHEST - 1 VIEW  COMPARISON:  November 23, 2013.  FINDINGS: Stable cardiomegaly. No pneumothorax or pleural effusion is noted. Both lungs are clear. The visualized skeletal structures are unremarkable.  IMPRESSION: No acute cardiopulmonary abnormality seen.   Electronically Signed   By: Roque Lias M.D.   On: 09/07/2014 02:12   Dg Esophagus Dilatation  09/22/2014   CLINICAL DATA:    ESOPHAGEAL DILATATION  Fluoroscopy was provided for use by the requesting  physician.  No images  were obtained for radiographic interpretation.    Dg Swallowing Func-speech Pathology  09/20/2014   Rod Mae, CCC-SLP     09/20/2014  2:40 PM Objective Swallowing Evaluation: Modified Barium Swallowing Study   Patient Details  Name: AMIKA TASSIN MRN: [id number] Date of Birth: 10/27/1933  Today's Date: 09/20/2014 Time: 0115-0130 SLP Time Calculation (min) (ACUTE ONLY): 15 min  Past Medical History:  Past Medical History  Diagnosis Date  . Hyperlipidemia   . Hypertension   . History of tobacco abuse    Past Surgical History:  Past Surgical History  Procedure Laterality Date  . Tonsillectomy and adenoidectomy    .  Appendectomy    . Childbirth      x 3  . Esophagogastroduodenoscopy N/A 09/18/2014    Procedure: ESOPHAGOGASTRODUODENOSCOPY (EGD);  Surgeon: Shirley Friar, MD;  Location: [facility] ENDOSCOPY;  Service: Endoscopy;   Laterality: N/A;  . Balloon dilation N/A 09/18/2014    Procedure: BALLOON DILATION;  Surgeon: Shirley Friar, MD;   Location: [facility] ENDOSCOPY;  Service: Endoscopy;  Laterality: N/A;  . Savory dilation N/A 09/18/2014    Procedure: SAVORY DILATION;  Surgeon: Shirley Friar, MD;   Location: [facility] ENDOSCOPY;  Service: Endoscopy;  Laterality: N/A;   no fluro needed   HPI:  80 year old female with history of HTN, everyday smoker, and  presumed COPD. She was admitted to [facility] in 10/2013 after falls with  acute respiratory failure. She was intubated in ICU and  eventually discharged to SNF. Family reports that she has been  anorexic for about the past 5 years and barely eats. After being  discharged from SNF her USOH was significantly diminished when  compared to that of before her hospitalization. Husband states  that she has not been herself for some time. Over the past  several months her abilities have been reduced. She can barely  walk anymore according to her husband and has had several falls  over the last 7 days. Her husband states that she has struck her  head hard  several times while falling. He is also concerned that  she has been overdosing on her prescribed medications, he is not  sure if this is intentional or not. For that reason he has been  assisting her with administration. 09/06/14 he thinks she took  her meds again after he gave them to her because he found her  unresponsive on the couch in the PM. She was brought to [facility] ED on  09/07/14 and was noted to be in hypercarbic respiratory failure  so she was placed on BiPAP. CT of the head was performed on  09/07/14, which showed acute intracranial hemorrhage. Her mental  status remains diminished. EGD completed on 09/18/14 and  Esophagram on 09/15/14 indicated prominent cricopharyngeal bar  with marked narrowing of the cervical esophagus .  MBS ordered to assess swallowing capacity and to  eliminate oropharyngeal issues related to dysphagia.     Assessment / Plan / Recommendation Clinical Impression  Dysphagia Diagnosis: Suspected primary esophageal dysphagia;  mild pharyngeal phase dysphagia; pt exhibited mild  vallecular/pyriform residue which cleared with subsequent swallow  with puree-solid consistencies; premature loss into vallecular  space noted with larger amounts of thin liquids probable d/t  presbyphagia; no aspiration or penetration noted throughout  study; recommend Regular diet (as tolerated) with esophageal  precautions and double swallow prn to clear residue in pharynx  during meals.    Treatment Recommendation  Therapy as outlined in treatment plan below    Diet Recommendation Regular;Thin liquid (as tolerated with  esophageal precautions; double swallow prn)   Liquid Administration via: Cup;Straw Medication Administration: Whole meds with puree (prn) Supervision: Patient able to self feed Compensations: Slow rate;Small sips/bites;Follow solids with  liquid (double swallow prn) Postural Changes and/or Swallow Maneuvers: Seated upright 90  degrees    Other  Recommendations Recommended Consults: Consider  esophageal  assessment Oral Care Recommendations: Oral care BID   Follow Up Recommendations  Other (comment) (SLE)    Frequency and Duration min 1 x/week  1 week   Pertinent Vitals/Pain WDL    SLP Swallow Goals  See POC  General Date of Onset: 09/06/14 HPI: 80 year old female with history of HTN, everyday smoker, and  presumed COPD. She was admitted to [facility] in 10/2013 after falls with  acute respiratory failure. She was intubated in ICU and  eventually discharged to SNF. Family reports that she has been  anorexic for about the past 5 years and barely eats. After being  discharged from SNF her USOH was significantly diminished when  compared to that of before her hospitalization. Husband states  that she has not been herself for some time. Over the past  several months her abilities have been reduced. She can barely  walk anymore according to her husband and has had several falls  over the last 7 days. Her husband states that she has struck her  head hard several times while falling. He is also concerned that  she has been overdosing on her prescribed medications, he is not  sure if this is intentional or not. For that reason he has been  assisting her with administration. 09/06/14 he thinks she took  her meds again after he gave them to her because he found her  unresponsive on the couch in the PM. She was brought to [facility] ED on  09/07/14 and was noted to be in hypercarbic respiratory failure  so she was placed on BiPAP. CT of the head was performed on  09/07/14, which showed acute intracranial hemorrhage. Her mental  status remains diminished. EGD scheduled for 09/18/14, but not  completed.  MBS ordered to assess swallowing capacity and to  eliminate oralpharyngeal issues relating to dysphagia. Type of Study: Modified Barium Swallowing Study Reason for Referral: Objectively evaluate swallowing function Diet Prior to this Study: Thin liquids Temperature Spikes Noted: No Respiratory Status: Nasal cannula History of Recent  Intubation: No Behavior/Cognition: Alert;Cooperative;Distractible;Requires  cueing Oral Cavity - Dentition: Adequate natural dentition Oral Motor / Sensory Function: Within functional limits Self-Feeding Abilities: Able to feed self Patient Positioning: Upright in chair Baseline Vocal Quality: Clear Volitional Cough: Strong Volitional Swallow: Able to elicit Anatomy: Within functional limits Pharyngeal Secretions: Not observed secondary MBS    Reason for Referral Objectively evaluate swallowing function   Oral Phase Oral Preparation/Oral Phase Oral Phase: WFL   Pharyngeal Phase Pharyngeal Phase Pharyngeal Phase: Impaired Pharyngeal - Solids Pharyngeal - Puree: Pharyngeal residue - pyriform  sinuses;Pharyngeal residue - valleculae Pharyngeal - Regular: Pharyngeal residue - valleculae;Pharyngeal  residue - pyriform sinuses Pharyngeal Phase - Comment Pharyngeal Comment: material cleared with subsequent swallows  Cervical Esophageal Phase        Cervical Esophageal Phase Cervical Esophageal Phase: [facility] Cervical Esophageal Phase - Comment Cervical Esophageal Comment: Residue in valleculae/pyriform  sinuses noted after swallow; cleared with subsequent swallow    Functional Assessment Tool Used: NOMS Functional Limitations: Swallowing Swallow Current Status ([id number]): At least 1 percent but less than  20 percent impaired, limited or restricted Swallow Goal Status [phone]): At least 1 percent but less than 20  percent impaired, limited or restricted Swallow Discharge Status [phone]): At least 1 percent but less  than 20 percent impaired, limited or restricted    ADAMS,PAT, M.S., CCC-SLP 09/20/2014, 2:11 PM     Microbiology: No results found for this or any previous visit (from the past 240 hour(s)).   Labs: Basic Metabolic Panel:  Recent Labs Lab 09/19/14 0509  NA 133*  K 4.7  CL 93*  CO2 33*  GLUCOSE 94  BUN <5*  CREATININE 0.37*  CALCIUM 9.1   Liver Function Tests: No  results for input(s): AST, ALT,  ALKPHOS, BILITOT, PROT, ALBUMIN in the last 168 hours. No results for input(s): LIPASE, AMYLASE in the last 168 hours. No results for input(s): AMMONIA in the last 168 hours. CBC:  Recent Labs Lab 09/19/14 0509  WBC 14.5*  HGB 13.2  HCT 42.3  MCV 93.0  PLT 250   Cardiac Enzymes: No results for input(s): CKTOTAL, CKMB, CKMBINDEX, TROPONINI in the last 168 hours. BNP: BNP (last 3 results)  Recent Labs  11/21/13 1550  PROBNP 16993.0*   CBG:  Recent Labs Lab 09/18/14 0640  GLUCAP 83       Signed:  Jearldine Cassady  [facility] 09/23/2014, 9:39 AM

## 2014-09-23 NOTE — Progress Notes (Signed)
Physical Therapy Treatment Patient Details Name: Wonda ChengJoan C Clucas MRN: [id number] DOB: 05/13/1934 Today's Date: 09/23/2014    History of Present Illness 80 yo female s/p fall with IVH and respiratory failure. pt reports wishes to committ suicide. UTI (+) NWG:[id number]:anxiety, HTN,depression, anorexia, home oxygen      PT Comments    Patient still easily distracted but with overall improved safety and balance. Plan is for DC to SNF later today. Patient perseverating on SNF placement  Follow Up Recommendations  SNF     Equipment Recommendations  None recommended by PT    Recommendations for Other Services       Precautions / Restrictions Precautions Precautions: Fall Precaution Comments: Impulsive Restrictions Weight Bearing Restrictions: No    Mobility  Bed Mobility Overal bed mobility: Modified Independent                Transfers   Equipment used: Rolling walker (2 wheeled) Transfers: Sit to/from Stand Sit to Stand: Supervision         General transfer comment: supervision for safety  Ambulation/Gait Ambulation/Gait assistance: Supervision Ambulation Distance (Feet): 600 Feet Assistive device: Rolling walker (2 wheeled) Gait Pattern/deviations: Step-through pattern;Decreased stride length Gait velocity: Fluctuates from fast to slow.   General Gait Details: Verbal cues to stay closer to RW, and to stand upright during gait.  Patient easily distracted by obstacles or others in hallway.     Stairs            Wheelchair Mobility    Modified Rankin (Stroke Patients Only) Modified Rankin (Stroke Patients Only) Pre-Morbid Rankin Score: Slight disability Modified Rankin: Moderately severe disability     Balance                                    Cognition Arousal/Alertness: Awake/alert Behavior During Therapy: Impulsive Overall Cognitive Status: Impaired/Different from baseline Area of Impairment:  Orientation;Safety/judgement;Awareness;Problem solving;Memory       Following Commands: Follows multi-step commands inconsistently Safety/Judgement: Decreased awareness of deficits;Decreased awareness of safety     General Comments: Patient continues to state that she wasn't going to eat and that she didn't understand why she is still here. Patient perseverating on discharging     Exercises      General Comments        Pertinent Vitals/Pain Pain Assessment: No/denies pain    Home Living                      Prior Function            PT Goals (current goals can now be found in the care plan section) Progress towards PT goals: Progressing toward goals    Frequency  Min 2X/week    PT Plan Current plan remains appropriate    Co-evaluation             End of Session Equipment Utilized During Treatment: Gait belt Activity Tolerance: Patient tolerated treatment well Patient left: in bed     Time: [id number] PT Time Calculation (min) (ACUTE ONLY): 18 min  Charges:  $Gait Training: 8-22 mins                    G Codes:      Fredrich BirksRobinette, Julia Elizabeth 09/23/2014, 10:54 AM 09/23/2014 Fredrich Birksobinette, Julia Elizabeth PTA [phone] pager [phone] office

## 2014-09-23 NOTE — Progress Notes (Signed)
Speech Language Pathology Treatment: Dysphagia  Patient Details Name: CAMBRI PLOURDE MRN: [id number] DOB: 1934/02/11 Today's Date: 09/23/2014 Time: 3500-9381 SLP Time Calculation (min) (ACUTE ONLY): 18 min  Assessment / Plan / Recommendation Clinical Impression  Pt demonstrates adequate ability to manage solids, though she continues to need cueing to complete basic esophageal precautions. SLP provided verbal cues and written reminders following rationale to encourage seconds swallow and following bites with sips. Pt reluctant to comply but is not at significant risk as deficits found on MBS described as mild. All education complete at this time, pt would benefit from f/u with SLP at SNF to reinforce strategies. Will upgrade diet to regular texture and thin liquids.    HPI HPI: 80 year old female with history of HTN, everyday smoker, and presumed COPD. She was admitted to [facility] in 10/2013 after falls with acute respiratory failure. She was intubated in ICU and eventually discharged to SNF. Family reports that she has been anorexic for about the past 5 years and barely eats. After being discharged from SNF her USOH was significantly diminished when compared to that of before her hospitalization. Husband states that she has not been herself for some time. Over the past several months her abilities have been reduced. She can barely walk anymore according to her husband and has had several falls over the last 7 days. Her husband states that she has struck her head hard several times while falling. He is also concerned that she has been overdosing on her prescribed medications, he is not sure if this is intentional or not. For that reason he has been assisting her with administration. 09/06/14 he thinks she took her meds again after he gave them to her because he found her unresponsive on the couch in the PM. She was brought to [facility] ED on 09/07/14 and was noted to be in hypercarbic respiratory failure so she was placed  on BiPAP. CT of the head was performed on 09/07/14, which showed acute intracranial hemorrhage. Her mental status remains diminished. EGD scheduled for 09/18/14, but not completed.  MBS ordered to assess swallowing capacity and to eliminate oralpharyngeal issues relating to dysphagia.   Pertinent Vitals Pain Assessment: No/denies pain  SLP Plan  All goals met    Recommendations Diet recommendations: Regular;Thin liquid Liquids provided via: Cup;Straw Medication Administration: Whole meds with puree Supervision: Patient able to self feed Compensations: Slow rate;Small sips/bites;Follow solids with liquid Postural Changes and/or Swallow Maneuvers: Seated upright 90 degrees;Upright 30-60 min after meal              Oral Care Recommendations: Oral care BID Follow up Recommendations: None Plan: All goals met    GO    Herbie Baltimore, MA CCC-SLP [phone]  Lynann Beaver 09/23/2014, 8:35 AM

## 2014-09-23 NOTE — Care Management (Signed)
ED CM received call from [facility] regarding needing a discharge summary faxed. Discharge Summary faxed to [phone] ATT: Vondra.

## 2014-09-27 ENCOUNTER — Non-Acute Institutional Stay (SKILLED_NURSING_FACILITY): Payer: Medicare Other | Admitting: Internal Medicine

## 2014-09-27 DIAGNOSIS — R1314 Dysphagia, pharyngoesophageal phase: Secondary | ICD-10-CM | POA: Diagnosis not present

## 2014-09-27 DIAGNOSIS — L989 Disorder of the skin and subcutaneous tissue, unspecified: Secondary | ICD-10-CM | POA: Diagnosis not present

## 2014-09-27 DIAGNOSIS — S06340D Traumatic hemorrhage of right cerebrum without loss of consciousness, subsequent encounter: Secondary | ICD-10-CM

## 2014-09-27 DIAGNOSIS — R1319 Other dysphagia: Secondary | ICD-10-CM

## 2014-09-27 DIAGNOSIS — F1910 Other psychoactive substance abuse, uncomplicated: Secondary | ICD-10-CM | POA: Diagnosis not present

## 2014-09-27 DIAGNOSIS — F32A Depression, unspecified: Secondary | ICD-10-CM

## 2014-09-27 DIAGNOSIS — J9622 Acute and chronic respiratory failure with hypercapnia: Secondary | ICD-10-CM

## 2014-09-27 DIAGNOSIS — R001 Bradycardia, unspecified: Secondary | ICD-10-CM | POA: Diagnosis not present

## 2014-09-27 DIAGNOSIS — R1310 Dysphagia, unspecified: Secondary | ICD-10-CM

## 2014-09-27 DIAGNOSIS — G9340 Encephalopathy, unspecified: Secondary | ICD-10-CM

## 2014-09-27 DIAGNOSIS — F329 Major depressive disorder, single episode, unspecified: Secondary | ICD-10-CM | POA: Diagnosis not present

## 2014-09-27 DIAGNOSIS — I10 Essential (primary) hypertension: Secondary | ICD-10-CM | POA: Diagnosis not present

## 2014-09-27 DIAGNOSIS — Z720 Tobacco use: Secondary | ICD-10-CM

## 2014-09-27 NOTE — Progress Notes (Signed)
MRN: [id number] Name: Laurie Horne  Sex: female Age: 81 y.o. DOB: 1934-03-01  PSC #: Sonny Dandy Facility/Room:120 Level Of Care: SNF Provider: Merrilee Seashore D Emergency Contacts: Extended Emergency Contact Information Primary Emergency Contact: Channell,John Address: [name]          [place] [address] [place] Home Phone: [phone] Mobile Phone: [phone] Relation: Spouse  Code Status: DNR  Allergies: Codeine  Chief Complaint  Patient presents with  . New Admit To SNF    HPI: Patient is 81 y.o. female who was found to have ICH and resp failure 2/2 what is believed unintentional bblocker OD along with ICH, resolved,  who is admitted to SNF for OT/PT.  Past Medical History  Diagnosis Date  . Hyperlipidemia   . Hypertension   . History of tobacco abuse     Past Surgical History  Procedure Laterality Date  . Tonsillectomy and adenoidectomy    . Appendectomy    . Childbirth      x 3  . Esophagogastroduodenoscopy N/A 09/18/2014    Procedure: ESOPHAGOGASTRODUODENOSCOPY (EGD);  Surgeon: Shirley Friar, MD;  Location: [facility] ENDOSCOPY;  Service: Endoscopy;  Laterality: N/A;  . Balloon dilation N/A 09/18/2014    Procedure: BALLOON DILATION;  Surgeon: Shirley Friar, MD;  Location: [facility] ENDOSCOPY;  Service: Endoscopy;  Laterality: N/A;  . Savory dilation N/A 09/18/2014    Procedure: SAVORY DILATION;  Surgeon: Shirley Friar, MD;  Location: [facility] ENDOSCOPY;  Service: Endoscopy;  Laterality: N/A;  no fluro needed  . Savory dilation N/A 09/22/2014    Procedure: SAVORY DILATION;  Surgeon: Vertell Novak., MD;  Location: [facility] ENDOSCOPY;  Service: Endoscopy;  Laterality: N/A;  . Esophagogastroduodenoscopy (egd) with propofol N/A 09/22/2014    Procedure: ESOPHAGOGASTRODUODENOSCOPY (EGD) WITH PROPOFOL;  Surgeon: Vertell Novak., MD;  Location: [facility] ENDOSCOPY;  Service: Endoscopy;  Laterality: N/A;      Medication List       This list is  accurate as of: 09/27/14 11:59 PM.  Always use your most recent med list.               albuterol (2.5 MG/3ML) 0.083% nebulizer solution  Commonly known as:  PROVENTIL  Take 3 mLs (2.5 mg total) by nebulization every 3 (three) hours as needed for wheezing or shortness of breath.     amLODipine 5 MG tablet  Commonly known as:  NORVASC  Take 1 tablet (5 mg total) by mouth daily.     aspirin 81 MG tablet  Take 81 mg by mouth daily.     brinzolamide 1 % ophthalmic suspension  Commonly known as:  AZOPT  Place 1 drop into both eyes every morning. As directed     feeding supplement (ENSURE COMPLETE) Liqd  Take 237 mLs by mouth 2 (two) times daily between meals.     ipratropium-albuterol 0.5-2.5 (3) MG/3ML Soln  Commonly known as:  DUONEB  Take 3 mLs by nebulization every 6 (six) hours.     loperamide 2 MG capsule  Commonly known as:  IMODIUM  Take 1 capsule (2 mg total) by mouth as needed for diarrhea or loose stools.     multivitamin with minerals Tabs tablet  Take 1 tablet by mouth daily.     nicotine 14 mg/24hr patch  Commonly known as:  NICODERM CQ - dosed in mg/24 hours  Place 1 patch (14 mg total) onto the skin daily.     pantoprazole 40 MG tablet  Commonly known as:  PROTONIX  Take 1 tablet (40 mg total) by mouth at bedtime.     thiamine 100 MG tablet  Take 1 tablet (100 mg total) by mouth daily.     Travoprost (BAK Free) 0.004 % Soln ophthalmic solution  Commonly known as:  TRAVATAN  Place 1 drop into both eyes at bedtime. As directed        No orders of the defined types were placed in this encounter.    Immunization History  Administered Date(s) Administered  . Pneumococcal Polysaccharide-23 09/08/2014    History  Substance Use Topics  . Smoking status: Current Every Day Smoker -- 0.25 packs/day    Types: Cigarettes  . Smokeless tobacco: Not on file  . Alcohol Use: No    Family history is noncontributory    Review of Systems  DATA OBTAINED:  from patient GENERAL:  no fevers, fatigue, appetite changes SKIN: No itching, rash or wounds EYES: No eye pain, redness, discharge EARS: No earache, tinnitus, change in hearing NOSE: No congestion, drainage or bleeding  MOUTH/THROAT: No mouth or tooth pain, No sore throat RESPIRATORY: No cough, wheezing, SOB CARDIAC: No chest pain, palpitations, lower extremity edema  GI: No abdominal pain, No N/V/D or constipation, No heartburn or reflux  GU: No dysuria, frequency or urgency, or incontinence  MUSCULOSKELETAL: No unrelieved bone/joint pain NEUROLOGIC: No headache, dizziness or focal weakness PSYCHIATRIC: No overt anxiety or sadness, No behavior issue.   Filed Vitals:   09/29/14 2046  BP: 144/83  Pulse: 83  Temp: 99 F (37.2 C)  Resp: 20    Physical Exam  GENERAL APPEARANCE: Alert, conversant,  No acute distress.  SKIN: No diaphoresis  HEAD: Normocephalic, atraumatic  EYES: Conjunctiva/lids clear. Pupils round, reactive. EOMs intact.  EARS: External exam WNL, canals clear. Hearing grossly normal.  NOSE: No deformity or discharge.  MOUTH/THROAT: Lips w/o lesions  RESPIRATORY: Breathing is even, unlabored. Lung sounds are clear   CARDIOVASCULAR: Heart RRR no murmurs, rubs or gallops. No peripheral edema.   GASTROINTESTINAL: Abdomen is soft, non-tender, not distended w/ normal bowel sounds. GENITOURINARY: Bladder non tender, not distended  MUSCULOSKELETAL: No abnormal joints or musculature NEUROLOGIC:  Cranial nerves 2-12 grossly intact. Moves all extremities  PSYCHIATRIC: Mood and affect appropriate to situation, no behavioral issues  Patient Active Problem List   Diagnosis Date Noted  . Acute encephalopathy 09/29/2014  . Bradycardia, sinus   . Diarrhea   . Suicide attempt   . Traumatic intracranial hemorrhage   . Metabolic encephalopathy   . Hyperkalemia   . Anxiety state   . Skin lesion of right lower extremity   . Acute on chronic respiratory failure with  hypercapnia   . Adverse reaction to beta-blocker   . Altered awareness, transient   . Sinus bradycardia   . Other specified hypotension   . Essential hypertension   . Hyponatremia   . Protein-calorie malnutrition   . Nicotine abuse   . Intracranial bleeding   . Anxiety 09/08/2014  . Depression 09/08/2014  . ICH (intracerebral hemorrhage) 09/07/2014  . Protein-calorie malnutrition, severe 11/26/2013  . Esophageal dysphagia 11/26/2013  . Staphylococcus aureus bronchitis 11/26/2013  . Respiratory failure with hypercapnia 11/21/2013  . Fall 11/21/2013  . Lethargic 11/21/2013  . Respiratory failure 11/21/2013  . History of tobacco abuse     CBC    Component Value Date/Time   WBC 14.5* 09/19/2014 0509   RBC 4.55 09/19/2014 0509   HGB 13.2 09/19/2014 0509   HCT  42.3 09/19/2014 0509   PLT 250 09/19/2014 0509   MCV 93.0 09/19/2014 0509   LYMPHSABS 1.5 09/15/2014 0447   MONOABS 0.9 09/15/2014 0447   EOSABS 0.2 09/15/2014 0447   BASOSABS 0.1 09/15/2014 0447    CMP     Component Value Date/Time   NA 133* 09/19/2014 0509   K 4.7 09/19/2014 0509   CL 93* 09/19/2014 0509   CO2 33* 09/19/2014 0509   GLUCOSE 94 09/19/2014 0509   BUN <5* 09/19/2014 0509   CREATININE 0.37* 09/19/2014 0509   CALCIUM 9.1 09/19/2014 0509   PROT 5.8* 09/14/2014 0636   ALBUMIN 2.9* 09/14/2014 0636   AST 24 09/14/2014 0636   ALT 14 09/14/2014 0636   ALKPHOS 83 09/14/2014 0636   BILITOT 0.4 09/14/2014 0636   GFRNONAA >90 09/19/2014 0509   GFRAA >90 09/19/2014 0509    Assessment and Plan  ICH (intracerebral hemorrhage) setting of frequent falls.  -Per neurosurgery no further diagnostic/therapeutic modalities required. -PT/OT recommends SNF  Esophageal dysphagia Had EGD 12/26: mild to moderate proximal esophageal narrowing -Barium swallow: esophageal dysphagia -Patient had EGD with dilatation 09/22/2014 -Patient able to tolerate soft regular diet, should be placed on heart  healthy  Depression Significant short-term memory loss -She was re-evaluated by psychiatry. She doesn't need to go to any psychiatric facility at this time. -Patient will need to follow up with Dr. Jama Flavors as an outpatient -Drug overdose was likely unintentional. Cognitive impairment may have contributed. -Appreciate neurology input. Repeat CT head report was reviewed. Thiamine was initiated  Acute encephalopathy Most likely multifactorial to include acute ICH vs UTI -Now appears back to baseline   Acute on chronic respiratory failure with hypercapnia -Resolved -Hx COPD without evidence of acute exacerbation -Patient does not use home oxygen  Bradycardia, sinus Resolved, Most likely secondary to beta blocker overdose   Essential hypertension Stable, Continue amlodipine and PRN hydralazine  Nicotine abuse                                       Continue nicotine patch   Skin lesion of right lower extremity Discussed with husband, patient will need to followup with dermatology as an outpatient. -Unknown etiology, ? Sebaceous cyst     Margit Hanks, MD

## 2014-09-29 ENCOUNTER — Encounter: Payer: Self-pay | Admitting: Internal Medicine

## 2014-09-29 DIAGNOSIS — G9340 Encephalopathy, unspecified: Secondary | ICD-10-CM | POA: Insufficient documentation

## 2014-09-29 NOTE — Assessment & Plan Note (Signed)
Stable, Continue amlodipine and PRN hydralazine

## 2014-09-29 NOTE — Assessment & Plan Note (Signed)
Continue nicotine patch  °

## 2014-09-29 NOTE — Assessment & Plan Note (Signed)
-  Resolved -Hx COPD without evidence of acute exacerbation -Patient does not use home oxygen

## 2014-09-29 NOTE — Assessment & Plan Note (Signed)
Most likely multifactorial to include acute ICH vs UTI -Now appears back to baseline

## 2014-09-29 NOTE — Assessment & Plan Note (Signed)
Resolved, Most likely secondary to beta blocker overdose

## 2014-09-29 NOTE — Assessment & Plan Note (Signed)
Significant short-term memory loss -She was re-evaluated by psychiatry. She doesn't need to go to any psychiatric facility at this time. -Patient will need to follow up with Dr. Jama Flavorsobos as an outpatient -Drug overdose was likely unintentional. Cognitive impairment may have contributed. -Appreciate neurology input. Repeat CT head report was reviewed. Thiamine was initiated

## 2014-09-29 NOTE — Assessment & Plan Note (Signed)
Discussed with husband, patient will need to followup with dermatology as an outpatient. -Unknown etiology, ? Sebaceous cyst

## 2014-09-29 NOTE — Assessment & Plan Note (Signed)
Had EGD 12/26: mild to moderate proximal esophageal narrowing -Barium swallow: esophageal dysphagia -Patient had EGD with dilatation 09/22/2014 -Patient able to tolerate soft regular diet, should be placed on heart healthy

## 2014-09-29 NOTE — Assessment & Plan Note (Signed)
setting of frequent falls.  -Per neurosurgery no further diagnostic/therapeutic modalities required. -PT/OT recommends SNF

## 2014-10-19 ENCOUNTER — Non-Acute Institutional Stay (SKILLED_NURSING_FACILITY): Payer: Medicare Other | Admitting: Nurse Practitioner

## 2014-10-19 DIAGNOSIS — L989 Disorder of the skin and subcutaneous tissue, unspecified: Secondary | ICD-10-CM | POA: Diagnosis not present

## 2014-10-19 DIAGNOSIS — G9340 Encephalopathy, unspecified: Secondary | ICD-10-CM | POA: Diagnosis not present

## 2014-10-19 DIAGNOSIS — I10 Essential (primary) hypertension: Secondary | ICD-10-CM | POA: Diagnosis not present

## 2014-10-19 DIAGNOSIS — Z720 Tobacco use: Secondary | ICD-10-CM

## 2014-10-19 DIAGNOSIS — J9622 Acute and chronic respiratory failure with hypercapnia: Secondary | ICD-10-CM | POA: Diagnosis not present

## 2014-10-19 DIAGNOSIS — S06340D Traumatic hemorrhage of right cerebrum without loss of consciousness, subsequent encounter: Secondary | ICD-10-CM

## 2014-10-19 DIAGNOSIS — R413 Other amnesia: Secondary | ICD-10-CM

## 2014-10-19 DIAGNOSIS — F1910 Other psychoactive substance abuse, uncomplicated: Secondary | ICD-10-CM | POA: Diagnosis not present

## 2014-10-19 NOTE — Progress Notes (Signed)
Patient ID: Laurie Horne, female   DOB: [birth year], 81 y.o.   MRN: [id number]    Nursing Home Location:  [facility]   Place of Service: SNF (31)  PCP: No primary care provider on file.  Allergies  Allergen Reactions  . Codeine Other (See Comments)    unknown    Chief Complaint  Patient presents with  . Discharge Note    HPI:  Patient is a 81 y.o. female seen today at [facility] for discharge home. Pt with pmh of HTN, everyday smoker, and COPD, frequent falls, memory decline. Pt was admitted to [facility] after hospitalization for acute respiratory failure for ongoing rehab. It was also noted pt had ICH after fall where she hit her head.  Pt also with nodular lesion that she plans on seeing dermatology for, husband already has appt. Patient currently doing well with therapy, now stable to discharge home with home health.   Review of Systems: per pt and husband- pt has no complaints Review of Systems  Constitutional: Negative for activity change, appetite change, fatigue and unexpected weight change.  HENT: Positive for hearing loss. Negative for congestion.   Eyes: Negative.   Respiratory: Negative for cough and shortness of breath.   Cardiovascular: Negative for chest pain, palpitations and leg swelling.  Gastrointestinal: Negative for abdominal pain, diarrhea and constipation.  Genitourinary: Negative for dysuria and difficulty urinating.  Musculoskeletal: Negative for myalgias and arthralgias.  Skin: Positive for wound (multiple areas to right leg, dermatology to assess). Negative for color change.  Neurological: Positive for weakness. Negative for dizziness.  Psychiatric/Behavioral: Positive for confusion. Negative for behavioral problems and agitation.    Past Medical History  Diagnosis Date  . Hyperlipidemia   . Hypertension   . History of tobacco abuse    Past Surgical History  Procedure Laterality Date  . Tonsillectomy and  adenoidectomy    . Appendectomy    . Childbirth      x 3  . Esophagogastroduodenoscopy N/A 09/18/2014    Procedure: ESOPHAGOGASTRODUODENOSCOPY (EGD);  Surgeon: Shirley Friar, MD;  Location: [facility] ENDOSCOPY;  Service: Endoscopy;  Laterality: N/A;  . Balloon dilation N/A 09/18/2014    Procedure: BALLOON DILATION;  Surgeon: Shirley Friar, MD;  Location: [facility] ENDOSCOPY;  Service: Endoscopy;  Laterality: N/A;  . Savory dilation N/A 09/18/2014    Procedure: SAVORY DILATION;  Surgeon: Shirley Friar, MD;  Location: [facility] ENDOSCOPY;  Service: Endoscopy;  Laterality: N/A;  no fluro needed  . Savory dilation N/A 09/22/2014    Procedure: SAVORY DILATION;  Surgeon: Vertell Novak., MD;  Location: [facility] ENDOSCOPY;  Service: Endoscopy;  Laterality: N/A;  . Esophagogastroduodenoscopy (egd) with propofol N/A 09/22/2014    Procedure: ESOPHAGOGASTRODUODENOSCOPY (EGD) WITH PROPOFOL;  Surgeon: Vertell Novak., MD;  Location: [facility] ENDOSCOPY;  Service: Endoscopy;  Laterality: N/A;   Social History:   reports that she has been smoking Cigarettes.  She has been smoking about 0.25 packs per day. She does not have any smokeless tobacco history on file. She reports that she does not drink alcohol. Her drug history is not on file.  No family history on file.  Medications: Patient's Medications  New Prescriptions   No medications on file  Previous Medications   ALBUTEROL (PROVENTIL) (2.5 MG/3ML) 0.083% NEBULIZER SOLUTION    Take 3 mLs (2.5 mg total) by nebulization every 3 (three) hours as needed for wheezing or shortness of breath.   AMLODIPINE (NORVASC) 5 MG TABLET  Take 1 tablet (5 mg total) by mouth daily.   ASPIRIN 81 MG TABLET    Take 81 mg by mouth daily.   BRINZOLAMIDE (AZOPT) 1 % OPHTHALMIC SUSPENSION    Place 1 drop into both eyes every morning. As directed   FEEDING SUPPLEMENT, ENSURE COMPLETE, (ENSURE COMPLETE) LIQD    Take 237 mLs by mouth 2 (two) times daily between meals.    IPRATROPIUM-ALBUTEROL (DUONEB) 0.5-2.5 (3) MG/3ML SOLN    Take 3 mLs by nebulization every 6 (six) hours.   LOPERAMIDE (IMODIUM) 2 MG CAPSULE    Take 1 capsule (2 mg total) by mouth as needed for diarrhea or loose stools.   MULTIPLE VITAMIN (MULTIVITAMIN WITH MINERALS) TABS TABLET    Take 1 tablet by mouth daily.   NICOTINE (NICODERM CQ - DOSED IN MG/24 HOURS) 14 MG/24HR PATCH    Place 1 patch (14 mg total) onto the skin daily.   PANTOPRAZOLE (PROTONIX) 40 MG TABLET    Take 1 tablet (40 mg total) by mouth at bedtime.   THIAMINE 100 MG TABLET    Take 1 tablet (100 mg total) by mouth daily.   TRAVOPROST, BAK FREE, (TRAVATAN) 0.004 % SOLN OPHTHALMIC SOLUTION    Place 1 drop into both eyes at bedtime. As directed  Modified Medications   No medications on file  Discontinued Medications   No medications on file     Physical Exam: Filed Vitals:   10/19/14 1858  BP: 126/72  Pulse: 62  Temp: 97 F (36.1 C)  Resp: 20    Physical Exam  Constitutional: She appears well-developed and well-nourished. No distress.  HENT:  Head: Normocephalic and atraumatic.  Mouth/Throat: Oropharynx is clear and moist. No oropharyngeal exudate.  Eyes: Conjunctivae are normal. Pupils are equal, round, and reactive to light.  Neck: Normal range of motion. Neck supple.  Cardiovascular: Normal rate, regular rhythm and normal heart sounds.   Pulmonary/Chest: Effort normal. She has decreased breath sounds.  Abdominal: Soft. Bowel sounds are normal.  Musculoskeletal: She exhibits no edema or tenderness.  Neurological: She is alert.  Skin: Skin is warm and dry. Lesion (to right LE, several areas, no signs of infection) noted. She is not diaphoretic.  Psychiatric: She has a normal mood and affect.    Labs reviewed: Basic Metabolic Panel:  Recent Labs  84/69/6202/28/15 1550  09/13/14 0545 09/14/14 0636 09/15/14 0447 09/16/14 0508 09/19/14 0509  NA 140  < > 135* 132*  --  132* 133*  K 5.6*  < > 4.4 4.6  --  4.8 4.7   CL 94*  < > 92* 88*  --  86* 93*  CO2 32  < > 37* 39*  --  39* 33*  GLUCOSE 110*  < > 94 93  --  92 94  BUN 61*  < > 10 <5*  --  6 <5*  CREATININE 1.21*  < > 0.35* 0.46*  --  0.50 0.37*  CALCIUM 8.4  < > 9.4 8.9  --  9.4 9.1  MG 2.6*  < > 2.1 2.0 2.0  --   --   PHOS 6.8*  --   --   --   --   --   --   < > = values in this interval not displayed. Liver Function Tests:  Recent Labs  09/12/14 0318 09/13/14 0545 09/14/14 0636  AST 18 18 24   ALT 12 12 14   ALKPHOS 89 89 83  BILITOT 0.2* 0.3 0.4  PROT 6.0  5.8* 5.8*  ALBUMIN 2.9* 2.9* 2.9*   No results for input(s): LIPASE, AMYLASE in the last 8760 hours. No results for input(s): AMMONIA in the last 8760 hours. CBC:  Recent Labs  09/13/14 0545 09/14/14 0636 09/15/14 0447 09/16/14 0508 09/19/14 0509  WBC 8.1 8.6 7.6 8.2 14.5*  NEUTROABS 5.8 5.8 5.0  --   --   HGB 13.7 13.9 13.5 14.0 13.2  HCT 45.1 43.9 43.6 44.2 42.3  MCV 92.8 91.6 91.8 91.1 93.0  PLT 169 202 204 223 250   TSH:  Recent Labs  09/08/14 2300  TSH 3.060   A1C: Lab Results  Component Value Date   HGBA1C 5.9* 09/15/2014   Lipid Panel:  Recent Labs  11/21/13 1550  TRIG <10    Assessment/Plan 1. Traumatic hemorrhage of right cerebrum, subsequent encounter Ongoing neurology followup. Closer monitoring at home to prevent falls discussed with husband. Home health PT/OT will be ordered  2. Acute encephalopathy Has resolved  3. Essential hypertension Stable on current regimen   4. Nicotine abuse Cont nicotine patch, not to smoke and take patch  5. Skin lesion of right lower extremity To keep appt for dermatology to evaluate  6. Memory loss Worsening memory loss, pt with neurology follow up. Husband now planning to be in charge of medications at home  7. Acute on chronic respiratory failure with hypercapnia resolved  pt is stable for discharge-will need PT/OT per home health. DME needed and written for. Rx written.  will need to follow up  with PCP within 2 weeks.

## 2014-10-26 ENCOUNTER — Ambulatory Visit (INDEPENDENT_AMBULATORY_CARE_PROVIDER_SITE_OTHER): Payer: Medicare Other | Admitting: Neurology

## 2014-10-26 ENCOUNTER — Encounter: Payer: Self-pay | Admitting: Neurology

## 2014-10-26 VITALS — BP 144/82 | HR 91 | Ht 65.0 in | Wt 96.8 lb

## 2014-10-26 DIAGNOSIS — R269 Unspecified abnormalities of gait and mobility: Secondary | ICD-10-CM

## 2014-10-26 DIAGNOSIS — R413 Other amnesia: Secondary | ICD-10-CM

## 2014-10-26 HISTORY — DX: Unspecified abnormalities of gait and mobility: R26.9

## 2014-10-26 HISTORY — DX: Other amnesia: R41.3

## 2014-10-26 NOTE — Patient Instructions (Signed)

## 2014-10-26 NOTE — Progress Notes (Signed)
Reason for visit: Memory disturbance  Laurie Horne is a 81 y.o. female  History of present illness:  Laurie Horne is a 81 year old left-handed white female with a history of several falls previously associated with head trauma. The patient fell one year ago on ice, and apparently struck the forehead. It is unknown whether this resulted in loss of consciousness. The patient was somewhat dazed afterwards. The patient did not have any significant memory issues following this fall, and she did not have any further falls until the summer of 2015. The patient fell at least 3 or 4 times, with at least 2 of these events associated with head trauma. The patient appeared to be confused a couple days after one of the falls, and she was blacking out intermittently at home. She appeared to be confused about how to take her medications. The patient may have overdosed on a beta blocker medication. She went to the hospital for an evaluation, and a CT scan of the brain showed a small intraventricular hemorrhage. The patient went to an extended care facility following this, and received rehabilitation. She has remained somewhat confused, with poor memory since that time, but the patient's husband believes that the memory is gradually improving. The patient continues to have some gait instability, and she is now using a walker for ambulation. She did not require any assistive device to walk prior to the December, 2015 admission. The patient was able to operate a motor vehicle without difficulty. She currently is on home oxygen. She comes to this office for an evaluation. The patient has noted some decreased hearing in the left ear following the December, 2015 admission.  Past Medical History  Diagnosis Date  . Hyperlipidemia   . Hypertension   . History of tobacco abuse   . COPD (chronic obstructive pulmonary disease)   . Esophageal stricture     s/p dilation  . Glaucoma   . Memory disorder 10/26/2014  . HOH  (hard of hearing)   . Gait disorder 10/26/2014    Past Surgical History  Procedure Laterality Date  . Tonsillectomy and adenoidectomy    . Appendectomy    . Childbirth      x 3  . Esophagogastroduodenoscopy N/A 09/18/2014    Procedure: ESOPHAGOGASTRODUODENOSCOPY (EGD);  Surgeon: Shirley Friar, MD;  Location: [facility] ENDOSCOPY;  Service: Endoscopy;  Laterality: N/A;  . Balloon dilation N/A 09/18/2014    Procedure: BALLOON DILATION;  Surgeon: Shirley Friar, MD;  Location: [facility] ENDOSCOPY;  Service: Endoscopy;  Laterality: N/A;  . Savory dilation N/A 09/18/2014    Procedure: SAVORY DILATION;  Surgeon: Shirley Friar, MD;  Location: [facility] ENDOSCOPY;  Service: Endoscopy;  Laterality: N/A;  no fluro needed  . Savory dilation N/A 09/22/2014    Procedure: SAVORY DILATION;  Surgeon: Vertell Novak., MD;  Location: [facility] ENDOSCOPY;  Service: Endoscopy;  Laterality: N/A;  . Esophagogastroduodenoscopy (egd) with propofol N/A 09/22/2014    Procedure: ESOPHAGOGASTRODUODENOSCOPY (EGD) WITH PROPOFOL;  Surgeon: Vertell Novak., MD;  Location: [facility] ENDOSCOPY;  Service: Endoscopy;  Laterality: N/A;  . Cataract extraction Bilateral     History reviewed. No pertinent family history.  Social history:  reports that she has quit smoking. Her smoking use included Cigarettes. She smoked 0.25 packs per day. She has never used smokeless tobacco. She reports that she does not drink alcohol. Her drug history is not on file.  Medications:  Current Outpatient Prescriptions on File Prior to Visit  Medication  Sig Dispense Refill  . amLODipine (NORVASC) 5 MG tablet Take 1 tablet (5 mg total) by mouth daily. 30 tablet 0  . aspirin 81 MG tablet Take 81 mg by mouth daily.    . brinzolamide (AZOPT) 1 % ophthalmic suspension Place 1 drop into both eyes every morning. As directed    . feeding supplement, ENSURE COMPLETE, (ENSURE COMPLETE) LIQD Take 237 mLs by mouth 2 (two) times daily between meals.    Marland Kitchen loperamide  (IMODIUM) 2 MG capsule Take 1 capsule (2 mg total) by mouth as needed for diarrhea or loose stools. 30 capsule 0  . nicotine (NICODERM CQ - DOSED IN MG/24 HOURS) 14 mg/24hr patch Place 1 patch (14 mg total) onto the skin daily. 28 patch 0  . Travoprost, BAK Free, (TRAVATAN) 0.004 % SOLN ophthalmic solution Place 1 drop into both eyes at bedtime. As directed     No current facility-administered medications on file prior to visit.      Allergies  Allergen Reactions  . Codeine Other (See Comments)    unknown    ROS:  Out of a complete 14 system review of symptoms, the patient complains only of the following symptoms, and all other reviewed systems are negative.  Hearing loss, left ear Dizziness Too much sleep Memory disturbance   Blood pressure 144/82, pulse 91, height  (1.651 m), weight 96 lb 12.8 oz (43.908 kg).  Physical Exam  General: The patient is alert and cooperative at the time of the examination.  Eyes: Pupils are equal, round, and reactive to light. Discs are flat bilaterally.  Ears: Tympanic membranes are clear bilaterally.  Neck: The neck is supple, no carotid bruits are noted.  Respiratory: The respiratory examination is clear.  Cardiovascular: The cardiovascular examination reveals a regular rate and rhythm, no obvious murmurs or rubs are noted.  Skin: Extremities are without significant edema.  Neurologic Exam  Mental status: The Mini-Mental Status Examination done today shows a total score 24/30.  Cranial nerves: Facial symmetry is present. There is good sensation of the face to pinprick and soft touch bilaterally. The strength of the facial muscles and the muscles to head turning and shoulder shrug are normal bilaterally. Speech is well enunciated, no aphasia or dysarthria is noted. Extraocular movements are full. Visual fields are full. The tongue is midline, and the patient has symmetric elevation of the soft palate. No obvious hearing deficits are  noted.  Motor: The motor testing reveals 5 over 5 strength of all 4 extremities. Good symmetric motor tone is noted throughout.  Sensory: Sensory testing is intact to pinprick, soft touch, vibration sensation, and position sense on all 4 extremities. No evidence of extinction is noted.  Coordination: Cerebellar testing reveals good finger-nose-finger and heel-to-shin bilaterally.  Gait and station: Gait is slightly wide-based, the patient is able to ambulate independently. The patient has a stooped posture with walking. Tandem gait is slightly unsteady.  Romberg is negative. No drift is seen.  Reflexes: Deep tendon reflexes are symmetric and normal bilaterally. Toes are downgoing bilaterally.   CT head 09/15/14:  IMPRESSION: 1. Interval resolution of previously seen intraventricular hemorrhage. No new intracranial hemorrhage. 2. Slight interval increase in ventricular size as compared to prior study without frank hydrocephalus. 3. Moderate chronic small vessel ischemic disease, stable.  *CT head images were reviewed on line.   Assessment/Plan:  1. Memory disturbance  2. Concussion, intraventricular hemorrhage  3. Gait disturbance  4. Decreased hearing, AS  The patient is having some  issues with cognitive processing following several falls associated with head trauma. The patient did have intraventricular bleeding. The patient's husband believes that she is improving gradually with her cognitive abilities. We will follow the patient conservatively. The patient is reporting some hearing problems in the left ear following the December, 2015 admission. We will consider a referral to an ENT physician.     Marlan Palau. Keith Willis MD 10/26/2014 6:53 PM  [facility] [address] [place], [place] [address]  Phone [phone] Fax [phone]

## 2014-10-28 ENCOUNTER — Telehealth: Payer: Self-pay | Admitting: Neurology

## 2014-10-28 NOTE — Telephone Encounter (Signed)
Patient's husband wanted to make Dr. Anne HahnWillis aware that he [facility] with Dr. Serena ColonelJefry Rosen for 11/04/2014.

## 2014-10-28 NOTE — Telephone Encounter (Signed)
Events noted

## 2015-01-27 ENCOUNTER — Ambulatory Visit (INDEPENDENT_AMBULATORY_CARE_PROVIDER_SITE_OTHER): Payer: Medicare Other | Admitting: Neurology

## 2015-01-27 ENCOUNTER — Encounter: Payer: Self-pay | Admitting: Neurology

## 2015-01-27 VITALS — BP 152/83 | HR 72 | Ht 66.0 in | Wt 102.4 lb

## 2015-01-27 DIAGNOSIS — R413 Other amnesia: Secondary | ICD-10-CM | POA: Diagnosis not present

## 2015-01-27 DIAGNOSIS — R269 Unspecified abnormalities of gait and mobility: Secondary | ICD-10-CM | POA: Diagnosis not present

## 2015-01-27 NOTE — Progress Notes (Signed)
Reason for visit: Closed head injury  Laurie Horne is an 81 y.o. female  History of present illness:  Ms. Laurie Horne is a 81 year old left-handed white female with a history of several falls, and associated head injury. The patient had some intraventricular hemorrhage in December 2015 demonstrated by CT of the head. The patient has done quite well since he was seen in February 2016. The husband believes that she has improved with her cognitive capacity. She has not had any further falls, her gait stability has improved. The patient is having some ongoing problems with fatigue, but she is gaining interest in reading the newspaper, magazines. The patient is sleeping more than she usually does. She denies any headaches. She is walking some on a regular basis. She is trying to do exercises at home. She returns for an evaluation. The patient does not operate a motor vehicle at this time. The dizziness complaints have significantly improved.  Past Medical History  Diagnosis Date  . Hyperlipidemia   . Hypertension   . History of tobacco abuse   . COPD (chronic obstructive pulmonary disease)   . Esophageal stricture     s/p dilation  . Glaucoma   . Memory disorder 10/26/2014  . HOH (hard of hearing)   . Gait disorder 10/26/2014  . Falls     Past Surgical History  Procedure Laterality Date  . Tonsillectomy and adenoidectomy    . Appendectomy    . Childbirth      x 3  . Esophagogastroduodenoscopy N/A 09/18/2014    Procedure: ESOPHAGOGASTRODUODENOSCOPY (EGD);  Surgeon: Shirley FriarVincent C. Schooler, MD;  [facility];  Service: Endoscopy;  Laterality: N/A;  . Balloon dilation N/A 09/18/2014    Procedure: BALLOON DILATION;  Surgeon: Shirley FriarVincent C. Schooler, MD;  [facility];  Service: Endoscopy;  Laterality: N/A;  . Savory dilation N/A 09/18/2014    Procedure: SAVORY DILATION;  Surgeon: Shirley FriarVincent C. Schooler, MD;  [facility];  Service: Endoscopy;  Laterality: N/A;  no fluro  needed  . Savory dilation N/A 09/22/2014    Procedure: SAVORY DILATION;  Surgeon: Vertell NovakJames L Edwards Jr., MD;  [facility];  Service: Endoscopy;  Laterality: N/A;  . Esophagogastroduodenoscopy (egd) with propofol N/A 09/22/2014    Procedure: ESOPHAGOGASTRODUODENOSCOPY (EGD) WITH PROPOFOL;  Surgeon: Vertell NovakJames L Edwards Jr., MD;  [facility];  Service: Endoscopy;  Laterality: N/A;  . Cataract extraction Bilateral     History reviewed. No pertinent family history.  Social history:  reports that she has quit smoking. Her smoking use included Cigarettes. She smoked 0.25 packs per day. She has never used smokeless tobacco. She reports that she does not drink alcohol or use illicit drugs.    Allergies  Allergen Reactions  . Codeine Other (See Comments)    unknown    Medications:  Prior to Admission medications   Medication Sig Start Date End Date Taking? Authorizing Provider  ALPRAZolam (XANAX) 0.25 MG tablet Take 0.25 mg by mouth 2 (two) times daily. 12/21/14  Yes Historical Provider, MD  amLODipine (NORVASC) 5 MG tablet Take 1 tablet (5 mg total) by mouth daily. 09/23/14  Yes Maryann Mikhail, DO  aspirin 81 MG tablet Take 81 mg by mouth daily.   Yes Historical Provider, MD  brinzolamide (AZOPT) 1 % ophthalmic suspension Place 1 drop into both eyes every morning. As directed   Yes Historical Provider, MD  feeding supplement, ENSURE COMPLETE, (ENSURE COMPLETE) LIQD Take 237 mLs by mouth 2 (two) times daily between  meals. 11/27/13  Yes Jeanella CrazeBrandi L Ollis, NP  latanoprost (XALATAN) 0.005 % ophthalmic solution 1 drop. 07/23/12  Yes Historical Provider, MD  nicotine (NICODERM CQ - DOSED IN MG/24 HOURS) 14 mg/24hr patch Place 1 patch (14 mg total) onto the skin daily. 09/23/14  Yes Maryann Mikhail, DO  omeprazole (PRILOSEC) 40 MG capsule  10/22/14  Yes Historical Provider, MD  Travoprost, BAK Free, (TRAVATAN) 0.004 % SOLN ophthalmic solution Place 1 drop into both eyes at bedtime. As  directed   Yes Historical Provider, MD    ROS:  Out of a complete 14 system review of symptoms, the patient complains only of the following symptoms, and all other reviewed systems are negative.  Fatigue Constipation Memory loss Dizziness  Blood pressure 152/83, pulse 72, height 5\' 6"  (1.676 m), weight 102 lb 6.4 oz (46.448 kg).  Physical Exam  General: The patient is alert and cooperative at the time of the examination.  Skin: No significant peripheral edema is noted.   Neurologic Exam  Mental status: The patient is alert and oriented x 2 at the time of the examination (not oriented to date). The patient has apparent normal recent and remote memory, with an apparently normal attention span and concentration ability. Mini-Mental Status Examination done today shows a total score 25/30.   Cranial nerves: Facial symmetry is present. Speech is normal, no aphasia or dysarthria is noted. Extraocular movements are full. Visual fields are full.  Motor: The patient has good strength in all 4 extremities.  Sensory examination: Soft touch sensation is symmetric on the face, arms, and legs.  Coordination: The patient has good finger-nose-finger and heel-to-shin bilaterally. Some apraxia with the use of the extremities is noted.  Gait and station: The patient has a normal gait. Tandem gait is slightly unsteady. Romberg is negative. No drift is seen.  Reflexes: Deep tendon reflexes are symmetric, but are depressed.   Assessment/Plan:  1. Closed head injury  2. Static encephalopathy  3. Mild gait disorder  The patient is doing relatively well is last seen. Her ability to walk is better, she has not sustained any falls. Her cognitive capacities have improved some, but she has not returned to baseline. There is some concern about visual fields, the patient will be seeing her ophthalmologist in the near future. The patient does not wish to return for a revisit, we will have them come  back on an as-needed basis.   Marlan Palau. Keith Willis MD 01/27/2015 7:23 PM  [facility] [address] [place], [place] [id number]  Phone [phone] Fax [phone]

## 2015-01-27 NOTE — Patient Instructions (Signed)

## 2015-04-10 ENCOUNTER — Encounter (HOSPITAL_COMMUNITY): Payer: Self-pay | Admitting: Emergency Medicine

## 2015-04-10 ENCOUNTER — Emergency Department (HOSPITAL_COMMUNITY)
Admission: EM | Admit: 2015-04-10 | Discharge: 2015-04-11 | Disposition: A | Discharge status: Home / Self Care | Payer: Medicare Other | Attending: Emergency Medicine | Admitting: Emergency Medicine

## 2015-04-10 DIAGNOSIS — W01198A Fall on same level from slipping, tripping and stumbling with subsequent striking against other object, initial encounter: Secondary | ICD-10-CM | POA: Diagnosis not present

## 2015-04-10 DIAGNOSIS — H9190 Unspecified hearing loss, unspecified ear: Secondary | ICD-10-CM | POA: Diagnosis not present

## 2015-04-10 DIAGNOSIS — Z8639 Personal history of other endocrine, nutritional and metabolic disease: Secondary | ICD-10-CM | POA: Diagnosis not present

## 2015-04-10 DIAGNOSIS — Z8719 Personal history of other diseases of the digestive system: Secondary | ICD-10-CM | POA: Insufficient documentation

## 2015-04-10 DIAGNOSIS — Z79899 Other long term (current) drug therapy: Secondary | ICD-10-CM | POA: Diagnosis not present

## 2015-04-10 DIAGNOSIS — Z87891 Personal history of nicotine dependence: Secondary | ICD-10-CM | POA: Insufficient documentation

## 2015-04-10 DIAGNOSIS — I10 Essential (primary) hypertension: Secondary | ICD-10-CM | POA: Insufficient documentation

## 2015-04-10 DIAGNOSIS — Y998 Other external cause status: Secondary | ICD-10-CM | POA: Insufficient documentation

## 2015-04-10 DIAGNOSIS — Z7982 Long term (current) use of aspirin: Secondary | ICD-10-CM | POA: Insufficient documentation

## 2015-04-10 DIAGNOSIS — Y9389 Activity, other specified: Secondary | ICD-10-CM | POA: Diagnosis not present

## 2015-04-10 DIAGNOSIS — H409 Unspecified glaucoma: Secondary | ICD-10-CM | POA: Insufficient documentation

## 2015-04-10 DIAGNOSIS — J449 Chronic obstructive pulmonary disease, unspecified: Secondary | ICD-10-CM | POA: Diagnosis not present

## 2015-04-10 DIAGNOSIS — S41111A Laceration without foreign body of right upper arm, initial encounter: Secondary | ICD-10-CM

## 2015-04-10 DIAGNOSIS — Y92030 Kitchen in apartment as the place of occurrence of the external cause: Secondary | ICD-10-CM | POA: Insufficient documentation

## 2015-04-10 MED ORDER — LIDOCAINE-EPINEPHRINE (PF) 2 %-1:200000 IJ SOLN
10.0000 mL | Freq: Once | INTRAMUSCULAR | Status: AC
  Administered 2015-04-11: 10 mL via INTRADERMAL
  Filled 2015-04-10: qty 20

## 2015-04-10 MED ORDER — BACITRACIN ZINC 500 UNIT/GM EX OINT
1.0000 "application " | TOPICAL_OINTMENT | Freq: Two times a day (BID) | CUTANEOUS | Status: DC
  Administered 2015-04-11: 1 via TOPICAL
  Filled 2015-04-10: qty 28.35

## 2015-04-10 NOTE — ED Notes (Addendum)
Patient presents to ED with reports that she fell in kitchen at home resulting in skin tear/laceration to left upper inner arm. Questionable as to whether head struck during fall. No LOC. Denies pain at this time.

## 2015-04-11 DIAGNOSIS — S41111A Laceration without foreign body of right upper arm, initial encounter: Secondary | ICD-10-CM | POA: Diagnosis not present

## 2015-04-11 MED ORDER — TRIPLE ANTIBIOTIC 5-400-5000 EX OINT: TOPICAL_OINTMENT | Freq: Four times a day (QID) | CUTANEOUS | Status: DC

## 2015-04-11 NOTE — Discharge Instructions (Signed)
We saw you in the ER for your WOUND. The laceration has been repaired, 7 stitches placed. They should dissolve in 2 weeks. Please read the instructions provided on wound care. Keep the area clean and dry, apply bacitracin ointment daily and take the medications provided. RETURN TO THE ER IF THERE IS INCREASED PAIN, REDNESS, PUS COMING OUT from the wound site.   Laceration Care, Adult A laceration is a cut or lesion that goes through all layers of the skin and into the tissue just beneath the skin. TREATMENT  Some lacerations may not require closure. Some lacerations may not be able to be closed due to an increased risk of infection. It is important to see your caregiver as soon as possible after an injury to minimize the risk of infection and maximize the opportunity for successful closure. If closure is appropriate, pain medicines may be given, if needed. The wound will be cleaned to help prevent infection. Your caregiver will use stitches (sutures), staples, wound glue (adhesive), or skin adhesive strips to repair the laceration. These tools bring the skin edges together to allow for faster healing and a better cosmetic outcome. However, all wounds will heal with a scar. Once the wound has healed, scarring can be minimized by covering the wound with sunscreen during the day for 1 full year. HOME CARE INSTRUCTIONS  For sutures or staples:  Keep the wound clean and dry.  If you were given a bandage (dressing), you should change it at least once a day. Also, change the dressing if it becomes wet or dirty, or as directed by your caregiver.  Wash the wound with soap and water 2 times a day. Rinse the wound off with water to remove all soap. Pat the wound dry with a clean towel.  After cleaning, apply a thin layer of the antibiotic ointment as recommended by your caregiver. This will help prevent infection and keep the dressing from sticking.  You may shower as usual after the first 24 hours. Do  not soak the wound in water until the sutures are removed.  Only take over-the-counter or prescription medicines for pain, discomfort, or fever as directed by your caregiver.  Get your sutures or staples removed as directed by your caregiver. For skin adhesive strips:  Keep the wound clean and dry.  Do not get the skin adhesive strips wet. You may bathe carefully, using caution to keep the wound dry.  If the wound gets wet, pat it dry with a clean towel.  Skin adhesive strips will fall off on their own. You may trim the strips as the wound heals. Do not remove skin adhesive strips that are still stuck to the wound. They will fall off in time. For wound adhesive:  You may briefly wet your wound in the shower or bath. Do not soak or scrub the wound. Do not swim. Avoid periods of heavy perspiration until the skin adhesive has fallen off on its own. After showering or bathing, gently pat the wound dry with a clean towel.  Do not apply liquid medicine, cream medicine, or ointment medicine to your wound while the skin adhesive is in place. This may loosen the film before your wound is healed.  If a dressing is placed over the wound, be careful not to apply tape directly over the skin adhesive. This may cause the adhesive to be pulled off before the wound is healed.  Avoid prolonged exposure to sunlight or tanning lamps while the skin adhesive is in  place. Exposure to ultraviolet light in the first year will darken the scar.  The skin adhesive will usually remain in place for 5 to 10 days, then naturally fall off the skin. Do not pick at the adhesive film. You may need a tetanus shot if:  You cannot remember when you had your last tetanus shot.  You have never had a tetanus shot. If you get a tetanus shot, your arm may swell, get red, and feel warm to the touch. This is common and not a problem. If you need a tetanus shot and you choose not to have one, there is a rare chance of getting  tetanus. Sickness from tetanus can be serious. SEEK MEDICAL CARE IF:   You have redness, swelling, or increasing pain in the wound.  You see a red line that goes away from the wound.  You have yellowish-white fluid (pus) coming from the wound.  You have a fever.  You notice a bad smell coming from the wound or dressing.  Your wound breaks open before or after sutures have been removed.  You notice something coming out of the wound such as wood or glass.  Your wound is on your hand or foot and you cannot move a finger or toe. SEEK IMMEDIATE MEDICAL CARE IF:   Your pain is not controlled with prescribed medicine.  You have severe swelling around the wound causing pain and numbness or a change in color in your arm, hand, leg, or foot.  Your wound splits open and starts bleeding.  You have worsening numbness, weakness, or loss of function of any joint around or beyond the wound.  You develop painful lumps near the wound or on the skin anywhere on your body. MAKE SURE YOU:   Understand these instructions.  Will watch your condition.  Will get help right away if you are not doing well or get worse. Document Released: 09/10/2005 Document Revised: 12/03/2011 Document Reviewed: 03/06/2011 [facility] Patient Information 2015 [place], [place]. This information is not intended to replace advice given to you by your health care provider. Make sure you discuss any questions you have with your health care provider.

## 2015-04-11 NOTE — ED Notes (Signed)
Dr. Rhunette CroftNanavati at bedside suturing

## 2015-04-11 NOTE — ED Provider Notes (Signed)
CSN: [id number]     Arrival date & time 04/10/15  2306 History   First MD Initiated Contact with Patient 04/10/15 2334     Chief Complaint  Patient presents with  . Extremity Laceration     (Consider location/radiation/quality/duration/timing/severity/associated sxs/prior Treatment) HPI Comments: SUBJECTIVE:  81 y.o. female sustained laceration of arm 1 hours ago. Nature of injury: cut from hitting a cabinet. Tetanus vaccination status reviewed: last tetanus booster within 10 years. Pt doesn't think she hit her head. She has no headaches, No nausea, vomiting, visual complains, seizures, altered mental status, loss of consciousness, new weakness, or numbness, no gait instability.    OBJECTIVE:  Patient appears well, vitals are normal. Laceration 7 cm noted.  Description: no foreign bodies, ragged edges, flap edge noted. Neurovascular and tendon structures are intact.  ASSESSMENT:  Laceration as described.  PLAN:  Anesthesia with 1% Lidocaine with Epinephrine. Wound cleansed, debrided of visible foreign material and necrotic tissue, and sutured. Antibiotic ointment and dressing applied.  Wound care instructions provided.  Observe for any signs of infection or other problems.    The history is provided by the patient.    Past Medical History  Diagnosis Date  . Hyperlipidemia   . Hypertension   . History of tobacco abuse   . COPD (chronic obstructive pulmonary disease)   . Esophageal stricture     s/p dilation  . Glaucoma   . Memory disorder 10/26/2014  . HOH (hard of hearing)   . Gait disorder 10/26/2014  . Falls    Past Surgical History  Procedure Laterality Date  . Tonsillectomy and adenoidectomy    . Appendectomy    . Childbirth      x 3  . Esophagogastroduodenoscopy N/A 09/18/2014    Procedure: ESOPHAGOGASTRODUODENOSCOPY (EGD);  Surgeon: Shirley FriarVincent C. Schooler, MD;  [facility];  Service: Endoscopy;  Laterality: N/A;  . Balloon dilation N/A 09/18/2014   Procedure: BALLOON DILATION;  Surgeon: Shirley FriarVincent C. Schooler, MD;  [facility];  Service: Endoscopy;  Laterality: N/A;  . Savory dilation N/A 09/18/2014    Procedure: SAVORY DILATION;  Surgeon: Shirley FriarVincent C. Schooler, MD;  [facility];  Service: Endoscopy;  Laterality: N/A;  no fluro needed  . Savory dilation N/A 09/22/2014    Procedure: SAVORY DILATION;  Surgeon: Vertell NovakJames L Edwards Jr., MD;  [facility];  Service: Endoscopy;  Laterality: N/A;  . Esophagogastroduodenoscopy (egd) with propofol N/A 09/22/2014    Procedure: ESOPHAGOGASTRODUODENOSCOPY (EGD) WITH PROPOFOL;  Surgeon: Vertell NovakJames L Edwards Jr., MD;  [facility];  Service: Endoscopy;  Laterality: N/A;  . Cataract extraction Bilateral    No family history on file. History  Substance Use Topics  . Smoking status: Former Smoker -- 0.25 packs/day    Types: Cigarettes  . Smokeless tobacco: Never Used  . Alcohol Use: No   OB History    No data available     Review of Systems  Musculoskeletal: Negative for back pain and arthralgias.  Skin: Positive for wound.  Neurological: Negative for headaches.  Hematological: Does not bruise/bleed easily.      Allergies  Codeine  Home Medications   Prior to Admission medications   Medication Sig Start Date End Date Taking? Authorizing Provider  ALPRAZolam (XANAX) 0.25 MG tablet Take 0.25 mg by mouth 2 (two) times daily. 12/21/14   Historical Provider, MD  amLODipine (NORVASC) 5 MG tablet Take 1 tablet (5 mg total) by mouth daily. 09/23/14   Maryann Mikhail, DO  aspirin 81 MG  tablet Take 81 mg by mouth daily.    Historical Provider, MD  brinzolamide (AZOPT) 1 % ophthalmic suspension Place 1 drop into both eyes every morning. As directed    Historical Provider, MD  feeding supplement, ENSURE COMPLETE, (ENSURE COMPLETE) LIQD Take 237 mLs by mouth 2 (two) times daily between meals. 11/27/13   Jeanella Craze, NP  latanoprost (XALATAN) 0.005 % ophthalmic solution 1  drop. 07/23/12   Historical Provider, MD  neomycin-bacitracin-polymyxin (NEOSPORIN) [phone] ointment Apply topically 4 (four) times daily. 04/11/15   Derwood Kaplan, MD  nicotine (NICODERM CQ - DOSED IN MG/24 HOURS) 14 mg/24hr patch Place 1 patch (14 mg total) onto the skin daily. 09/23/14   Maryann Mikhail, DO  omeprazole (PRILOSEC) 40 MG capsule  10/22/14   Historical Provider, MD  Travoprost, BAK Free, (TRAVATAN) 0.004 % SOLN ophthalmic solution Place 1 drop into both eyes at bedtime. As directed    Historical Provider, MD   BP 163/82 mmHg  Pulse 94  Temp(Src) 97.6 F (36.4 C) (Oral)  Resp 20  Ht  (1.651 m)  Wt 100 lb 2 oz (45.416 kg)  BMI 16.66 kg/m2  SpO2 96% Physical Exam  Constitutional: She is oriented to person, place, and time. She appears well-developed.  HENT:  Head: Normocephalic and atraumatic.  Eyes: EOM are normal.  Neck: Normal range of motion. Neck supple.  Cardiovascular: Normal rate.   Pulmonary/Chest: Effort normal.  Abdominal: Bowel sounds are normal.  Neurological: She is alert and oriented to person, place, and time.  Skin: Skin is warm and dry.  7 cm laceration, R arm, with 3 cm being deep, the other 4 cm being superficial  Nursing note and vitals reviewed.   ED Course  Procedures (including critical care time) Labs Review Labs Reviewed - No data to display  Imaging Review No results found.   EKG Interpretation None      MDM   Final diagnoses:  Laceration of upper extremity, right, initial encounter    LACERATION REPAIR Performed by: Derwood Kaplan Authorized by: Derwood Kaplan Consent: Verbal consent obtained. Risks and benefits: risks, benefits and alternatives were discussed Consent given by: patient Patient identity confirmed: provided demographic data Prepped and Draped in normal sterile fashion Wound explored  Laceration Location: left arm  Laceration Length: 7 cm  No Foreign Bodies seen or palpated  Anesthesia:  local infiltration  Local anesthetic: lidocaine 1 % with epinephrine  Anesthetic total: 5 ml  Irrigation method: syringe Amount of cleaning: standard  Skin closure: primary  Number of sutures: 7, 5-0 vicryl rapide  Technique: simple inturrupted  Patient tolerance: Patient tolerated the procedure well with no immediate complications.   Lac repaired. Pt is UTD with tetanus as far as she knows. Wound cleaned. Return precautions discussed.   Derwood Kaplan, MD 04/11/15 [phone]

## 2015-04-29 ENCOUNTER — Ambulatory Visit: Payer: Medicare Other | Admitting: Neurology

## 2018-12-12 ENCOUNTER — Encounter (HOSPITAL_COMMUNITY): Payer: Self-pay | Admitting: Obstetrics and Gynecology

## 2018-12-12 ENCOUNTER — Other Ambulatory Visit: Payer: Self-pay

## 2018-12-12 ENCOUNTER — Emergency Department (HOSPITAL_COMMUNITY): Payer: Medicare Other

## 2018-12-12 ENCOUNTER — Emergency Department (HOSPITAL_COMMUNITY)
Admission: EM | Admit: 2018-12-12 | Discharge: 2018-12-12 | Disposition: A | Discharge status: Home / Self Care | Payer: Medicare Other | Source: Home / Self Care | Attending: Emergency Medicine | Admitting: Emergency Medicine

## 2018-12-12 DIAGNOSIS — S82141A Displaced bicondylar fracture of right tibia, initial encounter for closed fracture: Secondary | ICD-10-CM

## 2018-12-12 DIAGNOSIS — Z79899 Other long term (current) drug therapy: Secondary | ICD-10-CM

## 2018-12-12 DIAGNOSIS — S82101A Unspecified fracture of upper end of right tibia, initial encounter for closed fracture: Secondary | ICD-10-CM

## 2018-12-12 DIAGNOSIS — Z87891 Personal history of nicotine dependence: Secondary | ICD-10-CM | POA: Insufficient documentation

## 2018-12-12 DIAGNOSIS — W010XXA Fall on same level from slipping, tripping and stumbling without subsequent striking against object, initial encounter: Secondary | ICD-10-CM | POA: Insufficient documentation

## 2018-12-12 DIAGNOSIS — Y9301 Activity, walking, marching and hiking: Secondary | ICD-10-CM

## 2018-12-12 DIAGNOSIS — T402X1A Poisoning by other opioids, accidental (unintentional), initial encounter: Secondary | ICD-10-CM | POA: Diagnosis not present

## 2018-12-12 DIAGNOSIS — Y92009 Unspecified place in unspecified non-institutional (private) residence as the place of occurrence of the external cause: Secondary | ICD-10-CM

## 2018-12-12 DIAGNOSIS — Z7982 Long term (current) use of aspirin: Secondary | ICD-10-CM | POA: Insufficient documentation

## 2018-12-12 DIAGNOSIS — I10 Essential (primary) hypertension: Secondary | ICD-10-CM | POA: Insufficient documentation

## 2018-12-12 DIAGNOSIS — J9602 Acute respiratory failure with hypercapnia: Secondary | ICD-10-CM | POA: Diagnosis not present

## 2018-12-12 DIAGNOSIS — Y999 Unspecified external cause status: Secondary | ICD-10-CM | POA: Insufficient documentation

## 2018-12-12 LAB — CBC WITH DIFFERENTIAL/PLATELET
Abs Immature Granulocytes: 0.08 10*3/uL — ABNORMAL HIGH (ref 0.00–0.07)
Basophils Absolute: 0.1 10*3/uL (ref 0.0–0.1)
Basophils Relative: 0 %
EOS PCT: 1 %
Eosinophils Absolute: 0.1 10*3/uL (ref 0.0–0.5)
HCT: 45.1 % (ref 36.0–46.0)
Hemoglobin: 13.4 g/dL (ref 12.0–15.0)
Immature Granulocytes: 1 %
Lymphocytes Relative: 15 %
Lymphs Abs: 2.2 10*3/uL (ref 0.7–4.0)
MCH: 28.8 pg (ref 26.0–34.0)
MCHC: 29.7 g/dL — ABNORMAL LOW (ref 30.0–36.0)
MCV: 96.8 fL (ref 80.0–100.0)
Monocytes Absolute: 1.1 10*3/uL — ABNORMAL HIGH (ref 0.1–1.0)
Monocytes Relative: 8 %
Neutro Abs: 11.2 10*3/uL — ABNORMAL HIGH (ref 1.7–7.7)
Neutrophils Relative %: 75 %
Platelets: 232 10*3/uL (ref 150–400)
RBC: 4.66 MIL/uL (ref 3.87–5.11)
RDW: 12.3 % (ref 11.5–15.5)
WBC: 14.8 10*3/uL — ABNORMAL HIGH (ref 4.0–10.5)
nRBC: 0 % (ref 0.0–0.2)

## 2018-12-12 LAB — BASIC METABOLIC PANEL
Anion gap: 8 (ref 5–15)
BUN: 7 mg/dL — ABNORMAL LOW (ref 8–23)
CO2: 37 mmol/L — ABNORMAL HIGH (ref 22–32)
Calcium: 9 mg/dL (ref 8.9–10.3)
Chloride: 87 mmol/L — ABNORMAL LOW (ref 98–111)
Creatinine, Ser: 0.47 mg/dL (ref 0.44–1.00)
GFR calc Af Amer: >60 mL/min (ref >60)
GFR calc non Af Amer: >60 mL/min (ref >60)
Glucose, Bld: 109 mg/dL — ABNORMAL HIGH (ref 70–99)
Potassium: 4.4 mmol/L (ref 3.5–5.1)
SODIUM: 132 mmol/L — AB (ref 135–145)

## 2018-12-12 MED ORDER — FENTANYL CITRATE (PF) 100 MCG/2ML IJ SOLN
50.0000 ug | Freq: Once | INTRAMUSCULAR | Status: AC
  Administered 2018-12-12: 50 ug via INTRAVENOUS
  Filled 2018-12-12: qty 2

## 2018-12-12 MED ORDER — NALOXONE HCL 0.4 MG/ML IJ SOLN
INTRAMUSCULAR | Status: AC
  Administered 2018-12-12: 0.4 mg
  Filled 2018-12-12: qty 1

## 2018-12-12 MED ORDER — HYDROCODONE-ACETAMINOPHEN 5-325 MG PO TABS: 1.0000 | ORAL_TABLET | ORAL | 0 refills | Status: DC | PRN

## 2018-12-12 MED ORDER — ONDANSETRON HCL 4 MG/2ML IJ SOLN
4.0000 mg | Freq: Once | INTRAMUSCULAR | Status: AC
  Administered 2018-12-12: 4 mg via INTRAVENOUS
  Filled 2018-12-12: qty 2

## 2018-12-12 NOTE — ED Notes (Signed)
Patient transported to X-ray 

## 2018-12-12 NOTE — ED Notes (Signed)
RN went in and patient was more unresponsive and and not responding to husband or staff. RN called PA Greta Doom to the bedside as MD was not readily available. PA Greta Doom gave verbal order to give narcan.   Patient's husband reports that before we gave her IV Fentanyl, he had given her her home alprazolam, aspirin, and amlodipine. MD told RN that she had given the patient's husband a verbal okay to give patient her home medications.

## 2018-12-12 NOTE — ED Notes (Signed)
Patient's husband at bedside became very agitated while RN attempted to ambulate patient. RN tried to explain to him that her hip was not broken and we needed to see if she could walk at this time. Patient became frustrated with RN for attempting to walk patient and stated that she has been sleeping in the living room since she had a previous fall and has not been able to get up the stairs. RN asked patient's husband if there is anyone helping them at home and he stated "i'm it honey!" Patient's husband became angry at RN stating "She hasn't had any pain medicine or anything!"  RN reported to patient's husband that she had been given pain and nausea medicine through the IV.  Patient's husband reports she has a walker at home but has not been using it because she does not need it.  RN called MD and told her that patient's husband is becoming agitated.

## 2018-12-12 NOTE — TOC Transition Note (Signed)
Transition of Care [facility]) - CM/SW Discharge Note   Patient Details  Name: AANIAH BATTLEY MRN: [id number] Date of Birth: 1933/12/10  [facility] CM/SW Contact:  Oletta Cohn, RN Phone Number: 12/12/2018, 2:05 PM   Clinical Narrative:    [facility] consulted regarding home health services.  EDCM asked RN to offer choice to pt and spouse.  Spouse states no preference.    Final next level of care: Home w Home Health Services Barriers to Discharge: Barriers Resolved   Patient Goals and CMS Choice Patient states their goals for this hospitalization and ongoing recovery are:: go home CMS Medicare.gov Compare Post Acute Care list provided to:: Patient Represenative (must comment)(spouse) Choice offered to / list presented to : Spouse  Discharge Placement  [facility] reviewed Medicare.gov home health compare list and started with company with 5 stars ([facility]) who accepted pt. Eugenio Hoes of [facility] notified. Patient spouse made aware that [facility] will be in contact in 24-48 hours.  No DME needs identified at this time.                      Discharge Plan and Services   Discharge Planning Services: CM Consult Post Acute Care Choice: Home Health              HH Arranged: RN, PT, OT, Nurse's Aide, Social Work [employer] Agency: [facility]   Social Determinants of Health (SDOH) Interventions     Readmission Risk Interventions No flowsheet data found.

## 2018-12-12 NOTE — ED Provider Notes (Signed)
[facility] Note   CSN: [id number] Arrival date & time: 12/12/18  1118    History   Chief Complaint Chief Complaint  Patient presents with   Fall   Leg Pain    HPI Laurie Horne is a 85 y.o. female.     Pt presents to the ED today with right knee pain.  Pt fell during the night while trying to get to the bathroom.  Her husband helped her get back to bed and she went back to sleep.  This morning, she was unable to put any weight on her right leg.  She points to above her knee where she is hurting.  She does not think she hit her head.  She is not on any blood thinners.     Past Medical History:  Diagnosis Date   COPD (chronic obstructive pulmonary disease) (HCC)    Esophageal stricture    s/p dilation   Falls    Gait disorder 10/26/2014   Glaucoma    History of tobacco abuse    HOH (hard of hearing)    Hyperlipidemia    Hypertension    Memory disorder 10/26/2014    Patient Active Problem List   Diagnosis Date Noted   Memory disorder 10/26/2014   Gait disorder 10/26/2014   Acute encephalopathy 09/29/2014   Bradycardia, sinus    Diarrhea    Suicide attempt [facility])    Traumatic intracranial hemorrhage (HCC)    Metabolic encephalopathy    Hyperkalemia    Anxiety state    Skin lesion of right lower extremity    Acute on chronic respiratory failure with hypercapnia (HCC)    Adverse reaction to beta-blocker    Altered awareness, transient    Sinus bradycardia    Other specified hypotension    Essential hypertension    Hyponatremia    Protein-calorie malnutrition (HCC)    Nicotine abuse    Intracranial bleeding (HCC)    Anxiety 09/08/2014   Depression 09/08/2014   ICH (intracerebral hemorrhage) (HCC) 09/07/2014   Protein-calorie malnutrition, severe (HCC) 11/26/2013   Esophageal dysphagia 11/26/2013   Staphylococcus aureus bronchitis 11/26/2013   Respiratory failure with  hypercapnia (HCC) 11/21/2013   Fall 11/21/2013   Lethargic 11/21/2013   Respiratory failure (HCC) 11/21/2013   History of tobacco abuse     Past Surgical History:  Procedure Laterality Date   APPENDECTOMY     BALLOON DILATION N/A 09/18/2014   Procedure: BALLOON DILATION;  Surgeon: Shirley Friar, MD;  Location: [facility] ENDOSCOPY;  Service: Endoscopy;  Laterality: N/A;   CATARACT EXTRACTION Bilateral    childbirth     x 3   ESOPHAGOGASTRODUODENOSCOPY N/A 09/18/2014   Procedure: ESOPHAGOGASTRODUODENOSCOPY (EGD);  Surgeon: Shirley Friar, MD;  Location: [facility] ENDOSCOPY;  Service: Endoscopy;  Laterality: N/A;   ESOPHAGOGASTRODUODENOSCOPY (EGD) WITH PROPOFOL N/A 09/22/2014   Procedure: ESOPHAGOGASTRODUODENOSCOPY (EGD) WITH PROPOFOL;  Surgeon: Vertell Novak., MD;  Location: [facility] ENDOSCOPY;  Service: Endoscopy;  Laterality: N/A;   SAVORY DILATION N/A 09/18/2014   Procedure: SAVORY DILATION;  Surgeon: Shirley Friar, MD;  Location[facility] ENDOSCOPY;  Service: Endoscopy;  Laterality: N/A;  no fluro needed   SAVORY DILATION N/A 09/22/2014   Procedure: SAVORY DILATION;  Surgeon: Vertell Novak., MD;  Location: [facility] ENDOSCOPY;  Service: Endoscopy;  Laterality: N/A;   TONSILLECTOMY AND ADENOIDECTOMY       OB History   No obstetric history on file.      Home Medications  Prior to Admission medications   Medication Sig Start Date End Date Taking? Authorizing Provider  ALPRAZolam (XANAX) 0.25 MG tablet Take 0.25 mg by mouth 2 (two) times daily. 12/21/14  Yes [provider]  amLODipine (NORVASC) 5 MG tablet Take 1 tablet (5 mg total) by mouth daily. 09/23/14  Yes Mikhail, Nita Sells, DO  aspirin 81 MG tablet Take 81 mg by mouth daily.   Yes [provider]  dorzolamide-timolol (COSOPT) 22.3-6.8 MG/ML ophthalmic solution Place 1 drop into both eyes 2 (two) times daily. 11/14/18  Yes [provider]  latanoprost (XALATAN) 0.005 % ophthalmic solution  Place 1 drop into both eyes at bedtime.  07/23/12  Yes [provider]  feeding supplement, ENSURE COMPLETE, (ENSURE COMPLETE) LIQD Take 237 mLs by mouth 2 (two) times daily between meals. Patient not taking: Reported on 12/12/2018 11/27/13   Jeanella Craze, NP  HYDROcodone-acetaminophen (NORCO/VICODIN) 5-325 MG tablet Take 1 tablet by mouth every 4 (four) hours as needed. 12/12/18   Jacalyn Lefevre, MD  neomycin-bacitracin-polymyxin (NEOSPORIN) [phone] ointment Apply topically 4 (four) times daily. Patient not taking: Reported on 12/12/2018 04/11/15   Derwood Kaplan, MD  nicotine (NICODERM CQ - DOSED IN MG/24 HOURS) 14 mg/24hr patch Place 1 patch (14 mg total) onto the skin daily. Patient not taking: Reported on 12/12/2018 09/23/14   Edsel Petrin, DO    Family History No family history on file.  Social History Social History   Tobacco Use   Smoking status: Former Smoker    Packs/day: 0.25    Types: Cigarettes   Smokeless tobacco: Never Used  Substance Use Topics   Alcohol use: No   Drug use: No     Allergies   Codeine   Review of Systems Review of Systems  Musculoskeletal:       Right knee pain  All other systems reviewed and are negative.    Physical Exam Updated Vital Signs BP (!) 131/94    Pulse 65    Resp 18    SpO2 100%   Physical Exam Vitals signs and nursing note reviewed.  Constitutional:      Appearance: Normal appearance. She is underweight.  HENT:     Head: Normocephalic and atraumatic.     Right Ear: External ear normal.     Left Ear: External ear normal.     Nose: Nose normal.     Mouth/Throat:     Mouth: Mucous membranes are moist.  Eyes:     Extraocular Movements: Extraocular movements intact.     Pupils: Pupils are equal, round, and reactive to light.  Neck:     Musculoskeletal: Normal range of motion and neck supple.  Cardiovascular:     Rate and Rhythm: Normal rate and regular rhythm.     Pulses: Normal pulses.     Heart  sounds: Normal heart sounds.  Pulmonary:     Effort: Pulmonary effort is normal.     Breath sounds: Normal breath sounds.  Abdominal:     General: Abdomen is flat.     Palpations: Abdomen is soft.  Musculoskeletal:     Right knee: She exhibits swelling. Tenderness found.  Skin:    General: Skin is warm.     Capillary Refill: Capillary refill takes less than 2 seconds.  Neurological:     General: No focal deficit present.     Mental Status: She is alert and oriented to person, place, and time.  Psychiatric:        Mood and  Affect: Mood normal.        Behavior: Behavior normal.      ED Treatments / Results  Labs (all labs ordered are listed, but only abnormal results are displayed) Labs Reviewed  BASIC METABOLIC PANEL - Abnormal; Notable for the following components:      Result Value   Sodium 132 (*)    Chloride 87 (*)    CO2 37 (*)    Glucose, Bld 109 (*)    BUN 7 (*)    All other components within normal limits  CBC WITH DIFFERENTIAL/PLATELET - Abnormal; Notable for the following components:   WBC 14.8 (*)    MCHC 29.7 (*)    Neutro Abs 11.2 (*)    Monocytes Absolute 1.1 (*)    Abs Immature Granulocytes 0.08 (*)    All other components within normal limits  URINALYSIS, ROUTINE W REFLEX MICROSCOPIC    EKG None  Radiology Dg Chest 2 View  Result Date: 12/12/2018 CLINICAL DATA:  85 year old female with right lower extremity pain after falling last night. EXAM: CHEST - 2 VIEW COMPARISON:  At home prior chest x-ray 09/08/2014 FINDINGS: Cardiopericardial silhouette remains unchanged. The heart is at the upper limits of normal for size. Atherosclerotic calcifications again visualized in the thoracic aorta. There is a moderately large hiatal hernia. The lungs appear hyperinflated with pulmonary vascular congestion and cephalization but no overt pulmonary edema. No suspicious pulmonary mass or nodule. No pleural effusion or pneumothorax. No acute osseous abnormality.  IMPRESSION: 1. Mild cardiomegaly with pulmonary vascular congestion but no overt edema. 2.  Aortic Atherosclerosis (ICD10-170.0) 3. Stable hyperinflation and chronic bronchitic changes. Electronically Signed   By: Malachy Moan M.D.   On: 12/12/2018 12:07   Dg Pelvis 1-2 Views  Result Date: 12/12/2018 CLINICAL DATA:  Fall.  Can not bear weight. EXAM: PELVIS - 1-2 VIEW COMPARISON:  11/21/2013. FINDINGS: Diffuse osteopenia. Severe degenerative changes scoliosis lumbar spine. Degenerative changes both hips. No acute bony abnormality identified. Aortoiliac and iliofemoral atherosclerotic vascular calcification. Pelvic calcifications consistent phleboliths. IMPRESSION: Diffuse osteopenia. Severe degenerative changes lumbar spine. Degenerative changes both hips. No acute bony abnormality. No evidence of fracture. 2.  Aortoiliac and iliofemoral atherosclerotic vascular disease. Electronically Signed   By: Maisie Fus  Register   On: 12/12/2018 12:06   Dg Knee Complete 4 Views Right  Result Date: 12/12/2018 CLINICAL DATA:  Right knee pain. Fall last night. Initial encounter. EXAM: RIGHT KNEE - COMPLETE 4+ VIEW COMPARISON:  None. FINDINGS: There is a fracture of the lateral tibial plateau with slight depression. The fracture extends distally and anteriorly to the level of the tibial tubercle on the lateral radiograph. There is a large hemarthrosis with fat fluid level. There is no dislocation. Chondrocalcinosis is noted in the medial and lateral compartments. Atherosclerotic vascular calcifications is noted. IMPRESSION: Lateral tibial plateau fracture with large hemarthrosis. Electronically Signed   By: Sebastian Ache M.D.   On: 12/12/2018 12:09    Procedures Procedures (including critical care time)  Medications Ordered in ED Medications  fentaNYL (SUBLIMAZE) injection 50 mcg (50 mcg Intravenous Given 12/12/18 1204)  ondansetron (ZOFRAN) injection 4 mg (4 mg Intravenous Given 12/12/18 1200)  fentaNYL  (SUBLIMAZE) injection 50 mcg (50 mcg Intravenous Given 12/12/18 1342)  naloxone (NARCAN) 0.4 MG/ML injection (0.4 mg  Given 12/12/18 1359)     Initial Impression / Assessment and Plan / ED Course  I have reviewed the triage vital signs and the nursing notes.  Pertinent labs & imaging results that were  available during my care of the patient were reviewed by me and considered in my medical decision making (see chart for details).       Pt is able to ambulate with brace and walker.  Pt has a walker at home.  I consulted case manager to set up home health.  This has been done.   Pt's husband did give her a xanax and shortly after, she was given fentanyl IV.  O2 sats dropped, and she required narcan.  She is now awake and alert and back to normal.  Pt is stable for d/c.  F/u with ortho.  Return if worse.  Final Clinical Impressions(s) / ED Diagnoses   Final diagnoses:  Closed fracture of right tibial plateau, initial encounter    ED Discharge Orders         Ordered    HYDROcodone-acetaminophen (NORCO/VICODIN) 5-325 MG tablet  Every 4 hours PRN     12/12/18 1454           Jacalyn Lefevre, MD 12/12/18 1457

## 2018-12-12 NOTE — ED Notes (Signed)
(  [phone] Case Manager

## 2018-12-12 NOTE — ED Notes (Signed)
ED provider at bedside.

## 2018-12-12 NOTE — Discharge Instructions (Signed)
Walk with walker

## 2018-12-12 NOTE — ED Triage Notes (Signed)
Per EMS: Pt reports she had a fall last night and her husband helped her back into bed. This morning she reportedly was unable to put weight on her right leg. No visible abnormalities.

## 2018-12-12 NOTE — Discharge Planning (Signed)
   Home health agencies that serve [phone]. Your favorite home health agencies  Home Health Agencies Search Results  Results List Table Home Health Agency Information Quality of Patient Care Rating Patient Survey Summary Rating  [facility]  [phone] 4 out of 5 stars 4 out of 5 stars  [facility]  [phone] 4  out of 5 stars 4 out of 5 stars  [facility], [place]  [phone] 4  out of 5 stars 4 out of 5 stars  [facility] [place]  [phone] 4 out of 5 stars 4 out of 5 stars  [facility] [place]  [phone] 3  out of 5 stars 3 out of 5 stars  [facility]  [phone] 3 out of 5 stars 3 out of 5 stars  [facility]  [phone] 4 out of 5 stars Not Available11  [facility]  [phone] 3 out of 5 stars 2 out of 5 stars  [facility]  [phone] 3  out of 5 stars 4 out of 5 stars  [facility]  [phone] 3  out of 5 stars 4 out of 5 stars  [facility]  [phone] 5 out of 5 stars 3 out of 5 stars  [facility] [place], [place]  [phone]

## 2018-12-12 NOTE — ED Notes (Signed)
Bed: WA16 Expected date:  Expected time:  Means of arrival:  Comments: EMS fall 

## 2018-12-14 ENCOUNTER — Emergency Department (HOSPITAL_COMMUNITY): Payer: Medicare Other

## 2018-12-14 ENCOUNTER — Encounter (HOSPITAL_COMMUNITY): Payer: Self-pay

## 2018-12-14 ENCOUNTER — Inpatient Hospital Stay (HOSPITAL_COMMUNITY)
Admission: EM | Admit: 2018-12-14 | Discharge: 2018-12-19 | DRG: 917 | Disposition: A | Discharge status: Skilled Nursing Facility | Payer: Medicare Other | Attending: Internal Medicine | Admitting: Internal Medicine

## 2018-12-14 DIAGNOSIS — R34 Anuria and oliguria: Secondary | ICD-10-CM | POA: Diagnosis not present

## 2018-12-14 DIAGNOSIS — E43 Unspecified severe protein-calorie malnutrition: Secondary | ICD-10-CM | POA: Diagnosis present

## 2018-12-14 DIAGNOSIS — Z79899 Other long term (current) drug therapy: Secondary | ICD-10-CM

## 2018-12-14 DIAGNOSIS — J9692 Respiratory failure, unspecified with hypercapnia: Secondary | ICD-10-CM | POA: Diagnosis present

## 2018-12-14 DIAGNOSIS — Z87891 Personal history of nicotine dependence: Secondary | ICD-10-CM | POA: Diagnosis not present

## 2018-12-14 DIAGNOSIS — Z885 Allergy status to narcotic agent status: Secondary | ICD-10-CM | POA: Diagnosis not present

## 2018-12-14 DIAGNOSIS — Z681 Body mass index (BMI) 19 or less, adult: Secondary | ICD-10-CM

## 2018-12-14 DIAGNOSIS — S82141D Displaced bicondylar fracture of right tibia, subsequent encounter for closed fracture with routine healing: Secondary | ICD-10-CM

## 2018-12-14 DIAGNOSIS — X58XXXD Exposure to other specified factors, subsequent encounter: Secondary | ICD-10-CM | POA: Diagnosis present

## 2018-12-14 DIAGNOSIS — D72829 Elevated white blood cell count, unspecified: Secondary | ICD-10-CM | POA: Diagnosis present

## 2018-12-14 DIAGNOSIS — Z66 Do not resuscitate: Secondary | ICD-10-CM | POA: Diagnosis present

## 2018-12-14 DIAGNOSIS — E785 Hyperlipidemia, unspecified: Secondary | ICD-10-CM | POA: Diagnosis present

## 2018-12-14 DIAGNOSIS — T402X1A Poisoning by other opioids, accidental (unintentional), initial encounter: Secondary | ICD-10-CM | POA: Diagnosis present

## 2018-12-14 DIAGNOSIS — Z9842 Cataract extraction status, left eye: Secondary | ICD-10-CM

## 2018-12-14 DIAGNOSIS — K5900 Constipation, unspecified: Secondary | ICD-10-CM | POA: Diagnosis not present

## 2018-12-14 DIAGNOSIS — J9601 Acute respiratory failure with hypoxia: Secondary | ICD-10-CM | POA: Diagnosis present

## 2018-12-14 DIAGNOSIS — J9622 Acute and chronic respiratory failure with hypercapnia: Secondary | ICD-10-CM | POA: Diagnosis not present

## 2018-12-14 DIAGNOSIS — Y92009 Unspecified place in unspecified non-institutional (private) residence as the place of occurrence of the external cause: Secondary | ICD-10-CM

## 2018-12-14 DIAGNOSIS — F419 Anxiety disorder, unspecified: Secondary | ICD-10-CM | POA: Diagnosis present

## 2018-12-14 DIAGNOSIS — T50901S Poisoning by unspecified drugs, medicaments and biological substances, accidental (unintentional), sequela: Secondary | ICD-10-CM | POA: Diagnosis not present

## 2018-12-14 DIAGNOSIS — H409 Unspecified glaucoma: Secondary | ICD-10-CM | POA: Diagnosis present

## 2018-12-14 DIAGNOSIS — Z7982 Long term (current) use of aspirin: Secondary | ICD-10-CM

## 2018-12-14 DIAGNOSIS — J9602 Acute respiratory failure with hypercapnia: Secondary | ICD-10-CM | POA: Diagnosis present

## 2018-12-14 DIAGNOSIS — F32A Depression, unspecified: Secondary | ICD-10-CM | POA: Diagnosis present

## 2018-12-14 DIAGNOSIS — Z9841 Cataract extraction status, right eye: Secondary | ICD-10-CM | POA: Diagnosis not present

## 2018-12-14 DIAGNOSIS — R400 Somnolence: Secondary | ICD-10-CM

## 2018-12-14 DIAGNOSIS — F329 Major depressive disorder, single episode, unspecified: Secondary | ICD-10-CM | POA: Diagnosis present

## 2018-12-14 DIAGNOSIS — J449 Chronic obstructive pulmonary disease, unspecified: Secondary | ICD-10-CM | POA: Diagnosis present

## 2018-12-14 DIAGNOSIS — I10 Essential (primary) hypertension: Secondary | ICD-10-CM | POA: Diagnosis present

## 2018-12-14 LAB — BLOOD GAS, ARTERIAL
Acid-Base Excess: 13.1 mmol/L — ABNORMAL HIGH (ref 0.0–2.0)
Acid-Base Excess: 13.6 mmol/L — ABNORMAL HIGH (ref 0.0–2.0)
Bicarbonate: 44.3 mmol/L — ABNORMAL HIGH (ref 20.0–28.0)
Bicarbonate: 40.9 mmol/L — ABNORMAL HIGH (ref 20.0–28.0)
Delivery systems: POSITIVE
Drawn by: 103701
Drawn by: 103701
Expiratory PAP: 5
FIO2: 24
Inspiratory PAP: 15
O2 Content: 2 L/min
O2 Saturation: 98.6 %
O2 Saturation: 91.6 %
PATIENT TEMPERATURE: 98.6
PH ART: 7.409 (ref 7.350–7.450)
Patient temperature: 98.6
RATE: 18 resp/min
pCO2 arterial: 104 mmHg (ref 32.0–48.0)
pCO2 arterial: 65.9 mmHg (ref 32.0–48.0)
pH, Arterial: 7.252 — ABNORMAL LOW (ref 7.350–7.450)
pO2, Arterial: 120 mmHg — ABNORMAL HIGH (ref 83.0–108.0)
pO2, Arterial: 57.9 mmHg — ABNORMAL LOW (ref 83.0–108.0)

## 2018-12-14 LAB — COMPREHENSIVE METABOLIC PANEL
ALT: 12 U/L (ref 0–44)
AST: 18 U/L (ref 15–41)
Albumin: 3.5 g/dL (ref 3.5–5.0)
Alkaline Phosphatase: 75 U/L (ref 38–126)
Anion gap: 8 (ref 5–15)
BUN: 13 mg/dL (ref 8–23)
CALCIUM: 9.5 mg/dL (ref 8.9–10.3)
CO2: 41 mmol/L — AB (ref 22–32)
Chloride: 85 mmol/L — ABNORMAL LOW (ref 98–111)
Creatinine, Ser: 0.37 mg/dL — ABNORMAL LOW (ref 0.44–1.00)
GFR calc Af Amer: >60 mL/min (ref >60)
GFR calc non Af Amer: >60 mL/min (ref >60)
Glucose, Bld: 116 mg/dL — ABNORMAL HIGH (ref 70–99)
Potassium: 4.1 mmol/L (ref 3.5–5.1)
SODIUM: 134 mmol/L — AB (ref 135–145)
Total Bilirubin: 0.7 mg/dL (ref 0.3–1.2)
Total Protein: 7.6 g/dL (ref 6.5–8.1)

## 2018-12-14 LAB — LACTIC ACID, PLASMA
Lactic Acid, Venous: 0.7 mmol/L (ref 0.5–1.9)
Lactic Acid, Venous: 1.9 mmol/L (ref 0.5–1.9)

## 2018-12-14 LAB — POCT I-STAT EG7
Acid-Base Excess: 12 mmol/L — ABNORMAL HIGH (ref 0.0–2.0)
Bicarbonate: 42.1 mmol/L — ABNORMAL HIGH (ref 20.0–28.0)
Calcium, Ion: 1.15 mmol/L (ref 1.15–1.40)
HCT: 38 % (ref 36.0–46.0)
Hemoglobin: 12.9 g/dL (ref 12.0–15.0)
O2 Saturation: 68 %
PCO2 VEN: 90.3 mmHg — AB (ref 44.0–60.0)
Potassium: 4.6 mmol/L (ref 3.5–5.1)
Sodium: 133 mmol/L — ABNORMAL LOW (ref 135–145)
TCO2: 45 mmol/L — ABNORMAL HIGH (ref 22–32)
pH, Ven: 7.277 (ref 7.250–7.430)
pO2, Ven: 43 mmHg (ref 32.0–45.0)

## 2018-12-14 LAB — RAPID URINE DRUG SCREEN, HOSP PERFORMED
AMPHETAMINES: NOT DETECTED
Barbiturates: NOT DETECTED
Benzodiazepines: POSITIVE — AB
Cocaine: NOT DETECTED
Opiates: POSITIVE — AB
Tetrahydrocannabinol: NOT DETECTED

## 2018-12-14 LAB — CBC WITH DIFFERENTIAL/PLATELET
Abs Immature Granulocytes: 0.1 10*3/uL — ABNORMAL HIGH (ref 0.00–0.07)
Basophils Absolute: 0.1 10*3/uL (ref 0.0–0.1)
Basophils Relative: 0 %
Eosinophils Absolute: 0 10*3/uL (ref 0.0–0.5)
Eosinophils Relative: 0 %
HCT: 43.6 % (ref 36.0–46.0)
Hemoglobin: 12.8 g/dL (ref 12.0–15.0)
Immature Granulocytes: 1 %
Lymphocytes Relative: 10 %
Lymphs Abs: 1.6 10*3/uL (ref 0.7–4.0)
MCH: 28.5 pg (ref 26.0–34.0)
MCHC: 29.4 g/dL — ABNORMAL LOW (ref 30.0–36.0)
MCV: 97.1 fL (ref 80.0–100.0)
Monocytes Absolute: 1.9 10*3/uL — ABNORMAL HIGH (ref 0.1–1.0)
Monocytes Relative: 12 %
NEUTROS PCT: 77 %
Neutro Abs: 12.5 10*3/uL — ABNORMAL HIGH (ref 1.7–7.7)
Platelets: 214 10*3/uL (ref 150–400)
RBC: 4.49 MIL/uL (ref 3.87–5.11)
RDW: 12.2 % (ref 11.5–15.5)
WBC: 16 10*3/uL — ABNORMAL HIGH (ref 4.0–10.5)
nRBC: 0 % (ref 0.0–0.2)

## 2018-12-14 LAB — CBG MONITORING, ED: Glucose-Capillary: 111 mg/dL — ABNORMAL HIGH (ref 70–99)

## 2018-12-14 LAB — URINALYSIS, ROUTINE W REFLEX MICROSCOPIC
Bilirubin Urine: NEGATIVE
Glucose, UA: NEGATIVE mg/dL
Hgb urine dipstick: NEGATIVE
Ketones, ur: NEGATIVE mg/dL
Leukocytes,Ua: NEGATIVE
Nitrite: NEGATIVE
Protein, ur: NEGATIVE mg/dL
Specific Gravity, Urine: 1.015 (ref 1.005–1.030)
pH: 6 (ref 5.0–8.0)

## 2018-12-14 LAB — TROPONIN I: Troponin I: <0.03 ng/mL (ref <0.03)

## 2018-12-14 LAB — BRAIN NATRIURETIC PEPTIDE: B Natriuretic Peptide: 177 pg/mL — ABNORMAL HIGH (ref 0.0–100.0)

## 2018-12-14 LAB — I-STAT CREATININE, ED: Creatinine, Ser: 0.4 mg/dL — ABNORMAL LOW (ref 0.44–1.00)

## 2018-12-14 LAB — MRSA PCR SCREENING: MRSA by PCR: NEGATIVE

## 2018-12-14 MED ORDER — ALBUTEROL SULFATE (2.5 MG/3ML) 0.083% IN NEBU
2.5000 mg | INHALATION_SOLUTION | Freq: Once | RESPIRATORY_TRACT | Status: AC
  Administered 2018-12-14: 2.5 mg via RESPIRATORY_TRACT
  Filled 2018-12-14: qty 3

## 2018-12-14 MED ORDER — AMLODIPINE BESYLATE 5 MG PO TABS
5.0000 mg | ORAL_TABLET | Freq: Every day | ORAL | Status: DC
  Administered 2018-12-15 – 2018-12-19 (×5): 5 mg via ORAL
  Filled 2018-12-14 (×5): qty 1

## 2018-12-14 MED ORDER — NALOXONE HCL 2 MG/2ML IJ SOSY
2.0000 mg | PREFILLED_SYRINGE | Freq: Once | INTRAMUSCULAR | Status: DC
  Filled 2018-12-14: qty 2

## 2018-12-14 MED ORDER — ASPIRIN EC 81 MG PO TBEC
81.0000 mg | DELAYED_RELEASE_TABLET | Freq: Every day | ORAL | Status: DC
  Administered 2018-12-15 – 2018-12-19 (×5): 81 mg via ORAL
  Filled 2018-12-14 (×5): qty 1

## 2018-12-14 MED ORDER — ORAL CARE MOUTH RINSE
15.0000 mL | Freq: Two times a day (BID) | OROMUCOSAL | Status: DC
  Administered 2018-12-17 (×2): 15 mL via OROMUCOSAL

## 2018-12-14 MED ORDER — NICOTINE 14 MG/24HR TD PT24: 14.0000 mg | MEDICATED_PATCH | TRANSDERMAL | Status: DC

## 2018-12-14 MED ORDER — CHLORHEXIDINE GLUCONATE 0.12 % MT SOLN
15.0000 mL | Freq: Two times a day (BID) | OROMUCOSAL | Status: DC
  Administered 2018-12-15 – 2018-12-19 (×8): 15 mL via OROMUCOSAL
  Filled 2018-12-14 (×9): qty 15

## 2018-12-14 MED ORDER — LATANOPROST 0.005 % OP SOLN
1.0000 [drp] | Freq: Every day | OPHTHALMIC | Status: DC
  Administered 2018-12-14 – 2018-12-18 (×5): 1 [drp] via OPHTHALMIC
  Filled 2018-12-14: qty 2.5

## 2018-12-14 MED ORDER — MAGNESIUM SULFATE 2 GM/50ML IV SOLN
2.0000 g | Freq: Once | INTRAVENOUS | Status: AC
  Administered 2018-12-14: 2 g via INTRAVENOUS
  Filled 2018-12-14: qty 50

## 2018-12-14 MED ORDER — ENSURE ENLIVE PO LIQD: 237.0000 mL | Freq: Two times a day (BID) | ORAL | Status: DC

## 2018-12-14 MED ORDER — FLUMAZENIL 0.5 MG/5ML IV SOLN
0.2000 mg | Freq: Once | INTRAVENOUS | Status: AC
  Administered 2018-12-14: 0.2 mg via INTRAVENOUS
  Filled 2018-12-14: qty 5

## 2018-12-14 MED ORDER — FLUMAZENIL 0.5 MG/5ML IV SOLN: 0.5000 mg | Freq: Once | INTRAVENOUS | Status: DC

## 2018-12-14 MED ORDER — ENOXAPARIN SODIUM 40 MG/0.4ML ~~LOC~~ SOLN
40.0000 mg | SUBCUTANEOUS | Status: DC
  Administered 2018-12-14 – 2018-12-18 (×5): 40 mg via SUBCUTANEOUS
  Filled 2018-12-14 (×5): qty 0.4

## 2018-12-14 MED ORDER — METHYLPREDNISOLONE SODIUM SUCC 125 MG IJ SOLR
125.0000 mg | Freq: Once | INTRAMUSCULAR | Status: AC
  Administered 2018-12-14: 125 mg via INTRAVENOUS
  Filled 2018-12-14: qty 2

## 2018-12-14 MED ORDER — FLUMAZENIL 0.5 MG/5ML IV SOLN
0.5000 mg | Freq: Once | INTRAVENOUS | Status: AC
  Administered 2018-12-14: 0.5 mg via INTRAVENOUS

## 2018-12-14 MED ORDER — TRIPLE ANTIBIOTIC 5-400-5000 EX OINT: TOPICAL_OINTMENT | Freq: Four times a day (QID) | CUTANEOUS | Status: DC

## 2018-12-14 MED ORDER — IPRATROPIUM-ALBUTEROL 0.5-2.5 (3) MG/3ML IN SOLN
3.0000 mL | Freq: Once | RESPIRATORY_TRACT | Status: AC
  Administered 2018-12-14: 3 mL via RESPIRATORY_TRACT
  Filled 2018-12-14: qty 3

## 2018-12-14 MED ORDER — NALOXONE HCL 2 MG/2ML IJ SOSY
PREFILLED_SYRINGE | INTRAMUSCULAR | Status: AC
  Administered 2018-12-14: 2 mg
  Filled 2018-12-14: qty 2

## 2018-12-14 MED ORDER — DORZOLAMIDE HCL-TIMOLOL MAL 2-0.5 % OP SOLN
1.0000 [drp] | Freq: Two times a day (BID) | OPHTHALMIC | Status: DC
  Administered 2018-12-14 – 2018-12-19 (×10): 1 [drp] via OPHTHALMIC
  Filled 2018-12-14: qty 10

## 2018-12-14 MED ORDER — CHLORHEXIDINE GLUCONATE CLOTH 2 % EX PADS
6.0000 | MEDICATED_PAD | Freq: Every day | CUTANEOUS | Status: DC
  Administered 2018-12-15: 6 via TOPICAL

## 2018-12-14 NOTE — ED Notes (Signed)
Patient transported to CT 

## 2018-12-14 NOTE — ED Triage Notes (Signed)
Pt from home via EMS. Husband called EMS c/o pt having AMS that started 2 days ago after taking her prescribed Norco for tibial fx on 3/22. On scene, pt was only responsive to painful stimuli. Pt arrives to ED A&O to self and place. Pt husband also reports that he gave her prescribed xanax with narcotic pain medication. Husband reports that pt is not eating or drinking. Pt complains of constipation. Pt is incontinent of urine, unknown idf this is her baseline. Pt stable and in NAD

## 2018-12-14 NOTE — ED Notes (Signed)
ED TO INPATIENT HANDOFF REPORT  ED Nurse Name and Phone #:  Larita FifeMadina, RN  S Name/Age/Gender Laurie DesanctisJoan D Horne 85 y.o. female Room/Bed: WA04/WA04  Code Status   Code Status: DNR  Home/SNF/Other Home Patient oriented to: self Is this baseline? No   Triage Complete: Triage complete  Chief Complaint AMS  Triage Note Pt from home via EMS. Husband called EMS c/o pt having AMS that started 2 days ago after taking her prescribed Norco for tibial fx on 3/22. On scene, pt was only responsive to painful stimuli. Pt arrives to ED A&O to self and place. Pt husband also reports that he gave her prescribed xanax with narcotic pain medication. Husband reports that pt is not eating or drinking. Pt complains of constipation. Pt is incontinent of urine, unknown idf this is her baseline. Pt stable and in NAD   Allergies Allergies  Allergen Reactions  . Codeine Other (See Comments)    Unknown rxn    Level of Care/Admitting Diagnosis ED Disposition    ED Disposition Condition Comment   Admit  [facility] [[id number]]  Level of Care: ICU [6]  Diagnosis: Respiratory failure with hypercapnia [facility](HCC) [[id number]][[id number]]  Admitting Physician: Tomma LightningLALERE, ADEWALE A [[id number]]  Attending Physician: Tomma LightningLALERE, ADEWALE A [[id number]]  Estimated length of stay: past midnight tomorrow  Certification:: I certify this patient will need inpatient services for at least 2 midnights  PT Class (Do Not Modify): Inpatient [101]  PT Acc Code (Do Not Modify): Private [1]       B Medical/Surgery History Past Medical History:  Diagnosis Date  . COPD (chronic obstructive pulmonary disease) (HCC)   . Esophageal stricture    s/p dilation  . Falls   . Gait disorder 10/26/2014  . Glaucoma   . History of tobacco abuse   . HOH (hard of hearing)   . Hyperlipidemia   . Hypertension   . Memory disorder 10/26/2014   Past Surgical History:  Procedure Laterality Date  . APPENDECTOMY    . BALLOON DILATION N/A  09/18/2014   Procedure: BALLOON DILATION;  Surgeon: Shirley FriarVincent C. Schooler, MD;  [facility];  Service: Endoscopy;  Laterality: N/A;  . CATARACT EXTRACTION Bilateral   . childbirth     x 3  . ESOPHAGOGASTRODUODENOSCOPY N/A 09/18/2014   Procedure: ESOPHAGOGASTRODUODENOSCOPY (EGD);  Surgeon: Shirley FriarVincent C. Schooler, MD;  [facility];  Service: Endoscopy;  Laterality: N/A;  . ESOPHAGOGASTRODUODENOSCOPY (EGD) WITH PROPOFOL N/A 09/22/2014   Procedure: ESOPHAGOGASTRODUODENOSCOPY (EGD) WITH PROPOFOL;  Surgeon: Vertell NovakJames L Edwards Jr., MD;  [facility];  Service: Endoscopy;  Laterality: N/A;  . SAVORY DILATION N/A 09/18/2014   Procedure: SAVORY DILATION;  Surgeon: Shirley FriarVincent C. Schooler, MD;  [facility];  Service: Endoscopy;  Laterality: N/A;  no fluro needed  . SAVORY DILATION N/A 09/22/2014   Procedure: SAVORY DILATION;  Surgeon: Vertell NovakJames L Edwards Jr., MD;  [facility];  Service: Endoscopy;  Laterality: N/A;  . TONSILLECTOMY AND ADENOIDECTOMY       A IV Location/Drains/Wounds Patient Lines/Drains/Airways Status   Active Line/Drains/Airways    Name:   Placement date:   Placement time:   Site:   Days:   Peripheral IV 12/14/18 Left Hand   12/14/18    1312    Hand   less than 1   Wound / Incision (Open or Dehisced) 11/22/13 Other (Comment) Leg Bilateral multiple scabbed areas to bil LEs;o bil UE's, face. and bil arms     11/22/13    -  Leg   1848   Wound / Incision (Open or Dehisced) 09/07/14 Other (Comment) Leg Right   09/07/14    0723    Leg   1559          Intake/Output Last 24 hours No intake or output data in the 24 hours ending 12/14/18 1813  Labs/Imaging Results for orders placed or performed during the hospital encounter of 12/14/18 (from the past 48 hour(s))  CBG monitoring, ED     Status: Abnormal   Collection Time: 12/14/18  1:18 PM  Result Value Ref Range   Glucose-Capillary 111 (H) 70 - 99 mg/dL  Lactic acid, plasma     Status: None    Collection Time: 12/14/18  1:25 PM  Result Value Ref Range   Lactic Acid, Venous 0.7 0.5 - 1.9 mmol/L    Comment: Performed at [facility], [address] [place], [place] [address]  Comprehensive metabolic panel     Status: Abnormal   Collection Time: 12/14/18  1:25 PM  Result Value Ref Range   Sodium 134 (L) 135 - 145 mmol/L   Potassium 4.1 3.5 - 5.1 mmol/L   Chloride 85 (L) 98 - 111 mmol/L   CO2 41 (H) 22 - 32 mmol/L   Glucose, Bld 116 (H) 70 - 99 mg/dL   BUN 13 8 - 23 mg/dL   Creatinine, Ser 6.04 (L) 0.44 - 1.00 mg/dL   Calcium 9.5 8.9 - 54.0 mg/dL   Total Protein 7.6 6.5 - 8.1 g/dL   Albumin 3.5 3.5 - 5.0 g/dL   AST 18 15 - 41 U/L   ALT 12 0 - 44 U/L   Alkaline Phosphatase 75 38 - 126 U/L   Total Bilirubin 0.7 0.3 - 1.2 mg/dL   GFR calc non Af Amer >60 >60 mL/min   GFR calc Af Amer >60 >60 mL/min   Anion gap 8 5 - 15    Comment: Performed at [facility], [address] [place], [place] [address]  CBC WITH DIFFERENTIAL     Status: Abnormal   Collection Time: 12/14/18  1:25 PM  Result Value Ref Range   WBC 16.0 (H) 4.0 - 10.5 K/uL   RBC 4.49 3.87 - 5.11 MIL/uL   Hemoglobin 12.8 12.0 - 15.0 g/dL   HCT 14.7 82.9 - 56.2 %   MCV 97.1 80.0 - 100.0 fL   MCH 28.5 26.0 - 34.0 pg   MCHC 29.4 (L) 30.0 - 36.0 g/dL   RDW 13.0 86.5 - 78.4 %   Platelets 214 150 - 400 K/uL   nRBC 0.0 0.0 - 0.2 %   Neutrophils Relative % 77 %   Neutro Abs 12.5 (H) 1.7 - 7.7 K/uL   Lymphocytes Relative 10 %   Lymphs Abs 1.6 0.7 - 4.0 K/uL   Monocytes Relative 12 %   Monocytes Absolute 1.9 (H) 0.1 - 1.0 K/uL   Eosinophils Relative 0 %   Eosinophils Absolute 0.0 0.0 - 0.5 K/uL   Basophils Relative 0 %   Basophils Absolute 0.1 0.0 - 0.1 K/uL   Immature Granulocytes 1 %   Abs Immature Granulocytes 0.10 (H) 0.00 - 0.07 K/uL    Comment: Performed at [facility], [address], [place], [place] [address]  Urinalysis, Routine w reflex  microscopic     Status: None   Collection Time: 12/14/18  1:25 PM  Result Value Ref Range   Color, Urine YELLOW YELLOW   APPearance CLEAR  CLEAR   Specific Gravity, Urine 1.015 1.005 - 1.030   pH 6.0 5.0 - 8.0   Glucose, UA NEGATIVE NEGATIVE mg/dL   Hgb urine dipstick NEGATIVE NEGATIVE   Bilirubin Urine NEGATIVE NEGATIVE   Ketones, ur NEGATIVE NEGATIVE mg/dL   Protein, ur NEGATIVE NEGATIVE mg/dL   Nitrite NEGATIVE NEGATIVE   Leukocytes,Ua NEGATIVE NEGATIVE    Comment: Performed at [facility], [address], [place], [place] [address]  Troponin I - ONCE - STAT     Status: None   Collection Time: 12/14/18  1:25 PM  Result Value Ref Range   Troponin I <0.03 <0.03 ng/mL    Comment: Performed at [facility], [address] [place], [place] [address]  Rapid urine drug screen (hospital performed)     Status: Abnormal   Collection Time: 12/14/18  1:25 PM  Result Value Ref Range   Opiates POSITIVE (A) NONE DETECTED   Cocaine NONE DETECTED NONE DETECTED   Benzodiazepines POSITIVE (A) NONE DETECTED   Amphetamines NONE DETECTED NONE DETECTED   Tetrahydrocannabinol NONE DETECTED NONE DETECTED   Barbiturates NONE DETECTED NONE DETECTED    Comment: (NOTE) DRUG SCREEN FOR MEDICAL PURPOSES ONLY.  IF CONFIRMATION IS NEEDED FOR ANY PURPOSE, NOTIFY LAB WITHIN 5 DAYS. LOWEST DETECTABLE LIMITS FOR URINE DRUG SCREEN Drug Class                     Cutoff (ng/mL) Amphetamine and metabolites    1000 Barbiturate and metabolites    200 Benzodiazepine                 200 Tricyclics and metabolites     300 Opiates and metabolites        300 Cocaine and metabolites        300 THC                            50 Performed at [facility], [address], [place], [place] [address]   Brain natriuretic peptide     Status: Abnormal   Collection Time: 12/14/18  1:26 PM  Result Value Ref Range   B Natriuretic Peptide 177.0 (H) 0.0 - 100.0 pg/mL     Comment: Performed at [facility], [address], [place], [place] [address]  Blood gas, arterial     Status: Abnormal   Collection Time: 12/14/18  2:28 PM  Result Value Ref Range   O2 Content 2.0 L/min   Delivery systems NASAL CANNULA    pH, Arterial 7.252 (L) 7.350 - 7.450   pCO2 arterial 104 (HH) 32.0 - 48.0 mmHg    Comment: CRITICAL RESULT CALLED TO, READ BACK BY AND VERIFIED WITH: DR.ZAMMIT AT 1438 BY T.BURGESS,RRT,RCP ON 12/14/2018    pO2, Arterial 120 (H) 83.0 - 108.0 mmHg   Bicarbonate 44.3 (H) 20.0 - 28.0 mmol/L   Acid-Base Excess 13.1 (H) 0.0 - 2.0 mmol/L   O2 Saturation 98.6 %   Patient temperature 98.6    Collection site LEFT RADIAL    Drawn by [id number]    Sample type ARTERIAL    Allens test (pass/fail) PASS PASS    Comment: Performed at [facility], [address], [place], [place] [address]  POCT I-Stat EG7     Status: Abnormal   Collection Time: 12/14/18  2:56 PM  Result Value Ref Range   pH, Ven 7.277 7.250 - 7.430  pCO2, Ven 90.3 (HH) 44.0 - 60.0 mmHg   pO2, Ven 43.0 32.0 - 45.0 mmHg   Bicarbonate 42.1 (H) 20.0 - 28.0 mmol/L   TCO2 45 (H) 22 - 32 mmol/L   O2 Saturation 68.0 %   Acid-Base Excess 12.0 (H) 0.0 - 2.0 mmol/L   Sodium 133 (L) 135 - 145 mmol/L   Potassium 4.6 3.5 - 5.1 mmol/L   Calcium, Ion 1.15 1.15 - 1.40 mmol/L   HCT 38.0 36.0 - 46.0 %   Hemoglobin 12.9 12.0 - 15.0 g/dL   Patient temperature HIDE    Sample type VENOUS    Comment NOTIFIED PHYSICIAN   I-Stat Creatinine, ED (not at [facility])     Status: Abnormal   Collection Time: 12/14/18  2:57 PM  Result Value Ref Range   Creatinine, Ser 0.40 (L) 0.44 - 1.00 mg/dL  Blood gas, arterial     Status: Abnormal   Collection Time: 12/14/18  5:50 PM  Result Value Ref Range   FIO2 24.00    Delivery systems BILEVEL POSITIVE AIRWAY PRESSURE    LHR 18 resp/min   Inspiratory PAP 15    Expiratory PAP 5.0    pH, Arterial 7.409 7.350 - 7.450   pCO2 arterial 65.9  (HH) 32.0 - 48.0 mmHg    Comment: CRITICAL RESULT CALLED TO, READ BACK BY AND VERIFIED WITH: DR.OLALERE AT1802 T.BURGESS,RRT,RCP ON 12/14/2018    pO2, Arterial 57.9 (L) 83.0 - 108.0 mmHg   Bicarbonate 40.9 (H) 20.0 - 28.0 mmol/L   Acid-Base Excess 13.6 (H) 0.0 - 2.0 mmol/L   O2 Saturation 91.6 %   Patient temperature 98.6    Collection site RIGHT RADIAL    Drawn by [id number]    Sample type ARTERIAL    Allens test (pass/fail) PASS PASS    Comment: Performed at [facility], [address] [place], [place] [address]   Ct Head Wo Contrast  Result Date: 12/14/2018 CLINICAL DATA:  Altered level of consciousness.  Unresponsive. EXAM: CT HEAD WITHOUT CONTRAST TECHNIQUE: Contiguous axial images were obtained from the base of the skull through the vertex without intravenous contrast. COMPARISON:  None. FINDINGS: Brain: Moderate generalized atrophy and white matter disease is similar the prior study. No acute infarct, hemorrhage, or mass lesion is present. Basal ganglia are intact. Insular ribbon is normal bilaterally. No focal cortical lesions are present. The ventricles are of proportionate to the degree of atrophy. No significant extraaxial fluid collection is present. The brainstem and cerebellum are within normal limits. Vascular: No hyperdense vessel or unexpected calcification. Skull: Calvarium is intact. No focal lytic or blastic lesions are present. Sinuses/Orbits: The paranasal sinuses and mastoid air cells are clear. Bilateral lens replacements are noted. Globes and orbits are otherwise unremarkable. IMPRESSION: 1. No acute intracranial abnormality. No acute or focal abnormality to explain altered mental status or unresponsiveness. 2. Stable atrophy and white matter disease. This likely reflects the sequela of chronic microvascular ischemia. Electronically Signed   By: Marin Roberts M.D.   On: 12/14/2018 14:27   Dg Chest Port 1 View  Result Date: 12/14/2018 CLINICAL  DATA:  Altered mental status for 2 days. EXAM: PORTABLE CHEST 1 VIEW COMPARISON:  PA and lateral chest 12/12/2018. FINDINGS: The lungs are clear. There is cardiomegaly. No pneumothorax or pleural effusion. Aortic atherosclerosis noted. No acute or focal bony abnormality. IMPRESSION: No acute disease. Cardiomegaly. Atherosclerosis. Electronically Signed   By: Drusilla Kanner M.D.   On: 12/14/2018 13:48    Pending Labs  Unresulted Labs (From admission, onward)    Start     Ordered   12/21/18 0500  Creatinine, serum  (enoxaparin (LOVENOX)    CrCl >/= 30 ml/min)  Weekly,   R    Comments:  while on enoxaparin therapy    12/14/18 1534   12/15/18 0500  CBC  Tomorrow morning,   R     12/14/18 1534   12/15/18 0500  Basic metabolic panel  Tomorrow morning,   R     12/14/18 1534   12/15/18 0500  Blood gas, arterial  Tomorrow morning,   R     12/14/18 1534   12/15/18 0500  Magnesium  Tomorrow morning,   R     12/14/18 1534   12/15/18 0500  Phosphorus  Tomorrow morning,   R     12/14/18 1534   12/14/18 1523  CBC  (enoxaparin (LOVENOX)    CrCl >/= 30 ml/min)  Once,   R    Comments:  Baseline for enoxaparin therapy IF NOT ALREADY DRAWN.  Notify MD if PLT < 100 K.    12/14/18 1534   12/14/18 1523  Creatinine, serum  (enoxaparin (LOVENOX)    CrCl >/= 30 ml/min)  Once,   R    Comments:  Baseline for enoxaparin therapy IF NOT ALREADY DRAWN.    12/14/18 1534   12/14/18 1325  Lactic acid, plasma  Now then every 2 hours,   STAT     12/14/18 1324   12/14/18 1325  Blood Culture (routine x 2)  BLOOD CULTURE X 2,   STAT     12/14/18 1324          Vitals/Pain Today's Vitals   12/14/18 1600 12/14/18 1736 12/14/18 1745 12/14/18 1800  BP: 131/67 121/73 (!) 142/75 119/65  Pulse: (!) 53 (!) 56 (!) 58 (!) 53  Resp: (!) Temp:      TempSrc:      SpO2: 95% 100% 96% 98%    Isolation Precautions No active isolations  Medications Medications  amLODipine (NORVASC) tablet 5 mg (has no  administration in time range)  feeding supplement (ENSURE COMPLETE) (ENSURE COMPLETE) liquid 237 mL (has no administration in time range)  dorzolamide-timolol (COSOPT) 22.3-6.8 MG/ML ophthalmic solution 1 drop (has no administration in time range)  latanoprost (XALATAN) 0.005 % ophthalmic solution 1 drop (has no administration in time range)  neomycin-bacitracin-polymyxin (NEOSPORIN) ointment (has no administration in time range)  nicotine (NICODERM CQ - dosed in mg/24 hours) patch 14 mg (has no administration in time range)  aspirin tablet 81 mg (has no administration in time range)  enoxaparin (LOVENOX) injection 40 mg (has no administration in time range)  naloxone (NARCAN) injection 2 mg (has no administration in time range)  naloxone [facility]) 2 MG/2ML injection (2 mg  Given 12/14/18 1324)  methylPREDNISolone sodium succinate (SOLU-MEDROL) 125 mg/2 mL injection 125 mg (125 mg Intravenous Given 12/14/18 1438)  magnesium sulfate IVPB 2 g 50 mL (2 g Intravenous New Bag/Given 12/14/18 1440)  ipratropium-albuterol (DUONEB) 0.5-2.5 (3) MG/3ML nebulizer solution 3 mL (3 mLs Nebulization Given 12/14/18 1515)  albuterol (PROVENTIL) (2.5 MG/3ML) 0.083% nebulizer solution 2.5 mg (2.5 mg Nebulization Given 12/14/18 1515)  flumazenil (ROMAZICON) injection 0.2 mg (0.2 mg Intravenous Given 12/14/18 1534)  flumazenil (ROMAZICON) injection 0.5 mg (0.5 mg Intravenous Given 12/14/18 1551)    Mobility walks with device    R Recommendations: See Admitting Provider Note  Report given to:   Additional Notes: altered  mental status

## 2018-12-14 NOTE — ED Provider Notes (Signed)
[facility]-EMERGENCY DEPT Provider Note   CSN: [id number] Arrival date & time: 12/14/18  1303    History   Chief Complaint Chief Complaint  Patient presents with  . Altered Mental Status    HPI LADAIJA Horne is a 85 y.o. female.     Patient with history of COPD.  She has been obtunded all day today but she was seen here on Friday with a tibial plateau fracture and was given hydrocodone.  Her husband states that she has been sleepy since she left here but he continued to give her pain medicine throughout the weekend and she has become more sleepy  The history is provided by the patient. No language interpreter was used.  Altered Mental Status  Presenting symptoms: behavior changes   Severity:  Moderate Most recent episode:  2 days ago Timing:  Constant Progression:  Worsening Chronicity:  New Context: not alcohol use   Associated symptoms: no abdominal pain, no headaches, no rash and no seizures     Past Medical History:  Diagnosis Date  . COPD (chronic obstructive pulmonary disease) (HCC)   . Esophageal stricture    s/p dilation  . Falls   . Gait disorder 10/26/2014  . Glaucoma   . History of tobacco abuse   . HOH (hard of hearing)   . Hyperlipidemia   . Hypertension   . Memory disorder 10/26/2014    Patient Active Problem List   Diagnosis Date Noted  . Memory disorder 10/26/2014  . Gait disorder 10/26/2014  . Acute encephalopathy 09/29/2014  . Bradycardia, sinus   . Diarrhea   . Suicide attempt (HCC)   . Traumatic intracranial hemorrhage (HCC)   . Metabolic encephalopathy   . Hyperkalemia   . Anxiety state   . Skin lesion of right lower extremity   . Acute on chronic respiratory failure with hypercapnia (HCC)   . Adverse reaction to beta-blocker   . Altered awareness, transient   . Sinus bradycardia   . Other specified hypotension   . Essential hypertension   . Hyponatremia   . Protein-calorie malnutrition (HCC)   . Nicotine  abuse   . Intracranial bleeding (HCC)   . Anxiety 09/08/2014  . Depression 09/08/2014  . ICH (intracerebral hemorrhage) (HCC) 09/07/2014  . Protein-calorie malnutrition, severe (HCC) 11/26/2013  . Esophageal dysphagia 11/26/2013  . Staphylococcus aureus bronchitis 11/26/2013  . Respiratory failure with hypercapnia (HCC) 11/21/2013  . Fall 11/21/2013  . Lethargic 11/21/2013  . Respiratory failure (HCC) 11/21/2013  . History of tobacco abuse     Past Surgical History:  Procedure Laterality Date  . APPENDECTOMY    . BALLOON DILATION N/A 09/18/2014   Procedure: BALLOON DILATION;  Surgeon: Shirley Friar, MD;  Location: [facility] ENDOSCOPY;  Service: Endoscopy;  Laterality: N/A;  . CATARACT EXTRACTION Bilateral   . childbirth     x 3  . ESOPHAGOGASTRODUODENOSCOPY N/A 09/18/2014   Procedure: ESOPHAGOGASTRODUODENOSCOPY (EGD);  Surgeon: Shirley Friar, MD;  Location: [facility] ENDOSCOPY;  Service: Endoscopy;  Laterality: N/A;  . ESOPHAGOGASTRODUODENOSCOPY (EGD) WITH PROPOFOL N/A 09/22/2014   Procedure: ESOPHAGOGASTRODUODENOSCOPY (EGD) WITH PROPOFOL;  Surgeon: Vertell Novak., MD;  Location: [facility] ENDOSCOPY;  Service: Endoscopy;  Laterality: N/A;  . SAVORY DILATION N/A 09/18/2014   Procedure: SAVORY DILATION;  Surgeon: Shirley Friar, MD;  Location: [facility] ENDOSCOPY;  Service: Endoscopy;  Laterality: N/A;  no fluro needed  . SAVORY DILATION N/A 09/22/2014   Procedure: SAVORY DILATION;  Surgeon: Vertell Novak.,  MD;  Location: [facility] ENDOSCOPY;  Service: Endoscopy;  Laterality: N/A;  . TONSILLECTOMY AND ADENOIDECTOMY       OB History   No obstetric history on file.      Home Medications    Prior to Admission medications   Medication Sig Start Date End Date Taking? Authorizing Provider  ALPRAZolam (XANAX) 0.25 MG tablet Take 0.25 mg by mouth 2 (two) times daily. 12/21/14   [provider]  amLODipine (NORVASC) 5 MG tablet Take 1 tablet (5 mg total) by mouth daily. 09/23/14    Edsel Petrin, DO  aspirin 81 MG tablet Take 81 mg by mouth daily.    [provider]  dorzolamide-timolol (COSOPT) 22.3-6.8 MG/ML ophthalmic solution Place 1 drop into both eyes 2 (two) times daily. 11/14/18   [provider]  feeding supplement, ENSURE COMPLETE, (ENSURE COMPLETE) LIQD Take 237 mLs by mouth 2 (two) times daily between meals. Patient not taking: Reported on 12/12/2018 11/27/13   Jeanella Craze, NP  HYDROcodone-acetaminophen (NORCO/VICODIN) 5-325 MG tablet Take 1 tablet by mouth every 4 (four) hours as needed. 12/12/18   Jacalyn Lefevre, MD  latanoprost (XALATAN) 0.005 % ophthalmic solution Place 1 drop into both eyes at bedtime.  07/23/12   [provider]  neomycin-bacitracin-polymyxin (NEOSPORIN) [phone] ointment Apply topically 4 (four) times daily. Patient not taking: Reported on 12/12/2018 04/11/15   Derwood Kaplan, MD  nicotine (NICODERM CQ - DOSED IN MG/24 HOURS) 14 mg/24hr patch Place 1 patch (14 mg total) onto the skin daily. Patient not taking: Reported on 12/12/2018 09/23/14   Edsel Petrin, DO    Family History No family history on file.  Social History Social History   Tobacco Use  . Smoking status: Former Smoker    Packs/day: 0.25    Types: Cigarettes  . Smokeless tobacco: Never Used  Substance Use Topics  . Alcohol use: No  . Drug use: No     Allergies   Codeine   Review of Systems Review of Systems  Unable to perform ROS: Mental status change  Constitutional: Negative for appetite change and fatigue.  HENT: Negative for congestion, ear discharge and sinus pressure.   Eyes: Negative for discharge.  Respiratory: Negative for cough.   Cardiovascular: Negative for chest pain.  Gastrointestinal: Negative for abdominal pain and diarrhea.  Genitourinary: Negative for frequency and hematuria.  Musculoskeletal: Negative for back pain.  Skin: Negative for rash.  Neurological: Negative for seizures and headaches.      Physical Exam Updated Vital Signs BP (!) 145/75   Pulse (!) 59   Temp (!) 97.1 F (36.2 C) (Rectal)   Resp (!) 22   SpO2 100%   Physical Exam Vitals signs and nursing note reviewed.  Constitutional:      Appearance: She is well-developed.     Comments: Lethargic  HENT:     Head: Normocephalic.     Mouth/Throat:     Comments: Gag reflex present Eyes:     General: No scleral icterus.    Conjunctiva/sclera: Conjunctivae normal.  Neck:     Musculoskeletal: Neck supple.     Thyroid: No thyromegaly.  Cardiovascular:     Rate and Rhythm: Normal rate and regular rhythm.     Heart sounds: No murmur. No friction rub. No gallop.   Pulmonary:     Breath sounds: No stridor. No wheezing or rales.  Chest:     Chest wall: No tenderness.  Abdominal:     General: There is no distension.  Tenderness: There is no abdominal tenderness. There is no rebound.  Musculoskeletal: Normal range of motion.  Lymphadenopathy:     Cervical: No cervical adenopathy.  Skin:    Findings: No erythema or rash.  Neurological:     Motor: No abnormal muscle tone.     Coordination: Coordination normal.     Comments: Patient opens her eyes to her name  Psychiatric:        Behavior: Behavior normal.      ED Treatments / Results  Labs (all labs ordered are listed, but only abnormal results are displayed) Labs Reviewed  CBC WITH DIFFERENTIAL/PLATELET - Abnormal; Notable for the following components:      Result Value   WBC 16.0 (*)    MCHC 29.4 (*)    Neutro Abs 12.5 (*)    Monocytes Absolute 1.9 (*)    Abs Immature Granulocytes 0.10 (*)    All other components within normal limits  BLOOD GAS, ARTERIAL - Abnormal; Notable for the following components:   pH, Arterial 7.252 (*)    pCO2 arterial 104 (*)    pO2, Arterial 120 (*)    Bicarbonate 44.3 (*)    Acid-Base Excess 13.1 (*)    All other components within normal limits  CBG MONITORING, ED - Abnormal; Notable for the following components:    Glucose-Capillary 111 (*)    All other components within normal limits  I-STAT CREATININE, ED - Abnormal; Notable for the following components:   Creatinine, Ser 0.40 (*)    All other components within normal limits  POCT I-STAT EG7 - Abnormal; Notable for the following components:   pCO2, Ven 90.3 (*)    Bicarbonate 42.1 (*)    TCO2 45 (*)    Acid-Base Excess 12.0 (*)    Sodium 133 (*)    All other components within normal limits  CULTURE, BLOOD (ROUTINE X 2)  CULTURE, BLOOD (ROUTINE X 2)  LACTIC ACID, PLASMA  URINALYSIS, ROUTINE W REFLEX MICROSCOPIC  LACTIC ACID, PLASMA  COMPREHENSIVE METABOLIC PANEL  BRAIN NATRIURETIC PEPTIDE  TROPONIN I  RAPID URINE DRUG SCREEN, HOSP PERFORMED    EKG EKG Interpretation  Date/Time:  Sunday December 14 2018 13:31:54 EDT Ventricular Rate:  67 PR Interval:    QRS Duration: 100 QT Interval:  389 QTC Calculation: 411 R Axis:   87 Text Interpretation:  Sinus rhythm Borderline right axis deviation Anteroseptal infarct, age indeterminate Minimal ST elevation, inferior leads Confirmed by Bethann Berkshire [phone]) on 12/14/2018 2:23:01 PM   Radiology Ct Head Wo Contrast  Result Date: 12/14/2018 CLINICAL DATA:  Altered level of consciousness.  Unresponsive. EXAM: CT HEAD WITHOUT CONTRAST TECHNIQUE: Contiguous axial images were obtained from the base of the skull through the vertex without intravenous contrast. COMPARISON:  None. FINDINGS: Brain: Moderate generalized atrophy and white matter disease is similar the prior study. No acute infarct, hemorrhage, or mass lesion is present. Basal ganglia are intact. Insular ribbon is normal bilaterally. No focal cortical lesions are present. The ventricles are of proportionate to the degree of atrophy. No significant extraaxial fluid collection is present. The brainstem and cerebellum are within normal limits. Vascular: No hyperdense vessel or unexpected calcification. Skull: Calvarium is intact. No focal lytic or  blastic lesions are present. Sinuses/Orbits: The paranasal sinuses and mastoid air cells are clear. Bilateral lens replacements are noted. Globes and orbits are otherwise unremarkable. IMPRESSION: 1. No acute intracranial abnormality. No acute or focal abnormality to explain altered mental status or unresponsiveness. 2. Stable atrophy and  white matter disease. This likely reflects the sequela of chronic microvascular ischemia. Electronically Signed   By: Marin Roberts M.D.   On: 12/14/2018 14:27   Dg Chest Port 1 View  Result Date: 12/14/2018 CLINICAL DATA:  Altered mental status for 2 days. EXAM: PORTABLE CHEST 1 VIEW COMPARISON:  PA and lateral chest 12/12/2018. FINDINGS: The lungs are clear. There is cardiomegaly. No pneumothorax or pleural effusion. Aortic atherosclerosis noted. No acute or focal bony abnormality. IMPRESSION: No acute disease. Cardiomegaly. Atherosclerosis. Electronically Signed   By: Drusilla Kanner M.D.   On: 12/14/2018 13:48    Procedures Procedures (including critical care time)  Medications Ordered in ED Medications  magnesium sulfate IVPB 2 g 50 mL (2 g Intravenous New Bag/Given 12/14/18 1440)  naloxone (NARCAN) 2 MG/2ML injection (2 mg  Given 12/14/18 1324)  methylPREDNISolone sodium succinate (SOLU-MEDROL) 125 mg/2 mL injection 125 mg (125 mg Intravenous Given 12/14/18 1438)  ipratropium-albuterol (DUONEB) 0.5-2.5 (3) MG/3ML nebulizer solution 3 mL (3 mLs Nebulization Given 12/14/18 1515)  albuterol (PROVENTIL) (2.5 MG/3ML) 0.083% nebulizer solution 2.5 mg (2.5 mg Nebulization Given 12/14/18 1515)     Initial Impression / Assessment and Plan / ED Course  I have reviewed the triage vital signs and the nursing notes.  Pertinent labs & imaging results that were available during my care of the patient were reviewed by me and considered in my medical decision making (see chart for details).        CRITICAL CARE Performed by: Bethann Berkshire Total critical care  time:40 minutes Critical care time was exclusive of separately billable procedures and treating other patients. Critical care was necessary to treat or prevent imminent or life-threatening deterioration. Critical care was time spent personally by me on the following activities: development of treatment plan with patient and/or surrogate as well as nursing, discussions with consultants, evaluation of patient's response to treatment, examination of patient, obtaining history from patient or surrogate, ordering and performing treatments and interventions, ordering and review of laboratory studies, ordering and review of radiographic studies, pulse oximetry and re-evaluation of patient's condition.  Patient with severe CO2 retention.  She has COPD and narcotic use.  Patient will be placed on BiPAP and will be admitted by the intensivist Final Clinical Impressions(s) / ED Diagnoses   Final diagnoses:  Somnolence    ED Discharge Orders    None       Bethann Berkshire, MD 12/14/18 1517

## 2018-12-14 NOTE — ED Notes (Signed)
Bed: WA04 Expected date:  Expected time:  Means of arrival:  Comments: 85 yo AMS

## 2018-12-14 NOTE — H&P (Signed)
NAME:  Laurie Horne, MRN:  [id number], DOB:  09/18/1934, LOS: 0 ADMISSION DATE:  12/14/2018, CONSULTATION DATE: 12/14/2018 REFERRING MD: Dr. Estell Harpin, CHIEF COMPLAINT: Hypercapnic respiratory failure  Brief History   Patient had recently been in the hospital for knee pain, was found to have a tibial plateau fracture.  Was discharged on hydrocodone and she uses Xanax at home The medication got a sleepy Her spouse in trying to help out her pain continued to give her pain medicine and antianxiety medication Was brought into the hospital obtunded  History of present illness     Past Medical History   Past Medical History:  Diagnosis Date  . COPD (chronic obstructive pulmonary disease) (HCC)   . Esophageal stricture    s/p dilation  . Falls   . Gait disorder 10/26/2014  . Glaucoma   . History of tobacco abuse   . HOH (hard of hearing)   . Hyperlipidemia   . Hypertension   . Memory disorder 10/26/2014   Significant Hospital Events   Obtundation  Consults:  pccm 12/14/2018  Procedures:    Significant Diagnostic Tests:  ABG revealed severe hypercapnia  Micro Data:  Blood cultures 12/14/2018  Antimicrobials:    Interim history/subjective:  Patient had started to arouse following [place]  Objective   Blood pressure (!) 145/75, pulse (!) 59, temperature (!) 97.1 F (36.2 C), temperature source Rectal, resp. rate (!) 22, SpO2 100 %.       No intake or output data in the 24 hours ending 12/14/18 1521 There were no vitals filed for this visit.  Examination: General: Elderly lady, very frail HENT: Dry oral mucosa, house BiPAP mask on at present  Lungs: Decreased air movement bilaterally Cardiovascular: S1-S2 appreciated Abdomen: Bowel sounds appreciated Extremities: No edema, no clubbing Neuro: Obtunded GU:   Resolved Hospital Problem list     Assessment & Plan:  Hypercapnic respiratory failure Likely related to a combination of underlying COPD, opiate and  benzodiazepines use -Hold off on opiates and benzodiazepines at present -Follow arterial blood gases -Maintain on BiPAP at present  -BiPAP settings adjusted to 15/5 -Decrease FiO2 to maintain saturations greater than 90%  Chronic obstructive pulmonary disease -Was not on any inhalers at home -PRN bronchodilators  Hypertension -On amlodipine 5 mg  Patella fracture -Reinitiate pain medications once acute hypoxic Respiratory failure resolves -Need to optimize medications to avoid recurrent altered mentation  History of anxiety/depression -Need to optimize anxiolytic once acute hypercapnic respiratory failure resolves  Leukocytosis -Trend CBC -History is not consistent with any infectious process  Continue BiPAP Follow arterial blood gases Discussed extensively with spouse at bedside   Best practice:  Diet: Regular Pain/Anxiety/Delirium protocol (if indicated): Hold at present VAP protocol (if indicated): Not applicable DVT prophylaxis: Lovenox  GI prophylaxis:  Glucose control:  Mobility: Bedrest Code Status: DNR Family Communication: Discussed with spouse at bedside Disposition:   Labs   CBC: Recent Labs  Lab 12/12/18 1126 12/14/18 1325 12/14/18 1456  WBC 14.8* 16.0*  --   NEUTROABS 11.2* 12.5*  --   HGB 13.4 12.8 12.9  HCT 45.1 43.6 38.0  MCV 96.8 97.1  --   PLT 232 214  --     Basic Metabolic Panel: Recent Labs  Lab 12/12/18 1126 12/14/18 1325 12/14/18 1456 12/14/18 1457  NA 132* 134* 133*  --   K 4.4 4.1 4.6  --   CL 87* 85*  --   --   CO2 37* 41*  --   --  GLUCOSE 109* 116*  --   --   BUN 7* 13  --   --   CREATININE 0.47 0.37*  --  0.40*  CALCIUM 9.0 9.5  --   --    GFR: CrCl cannot be calculated (Unknown ideal weight.). Recent Labs  Lab 12/12/18 1126 12/14/18 1325  WBC 14.8* 16.0*  LATICACIDVEN  --  0.7    Liver Function Tests: Recent Labs  Lab 12/14/18 1325  AST 18  ALT 12  ALKPHOS 75  BILITOT 0.7  PROT 7.6  ALBUMIN 3.5    No results for input(s): LIPASE, AMYLASE in the last 168 hours. No results for input(s): AMMONIA in the last 168 hours.  ABG    Component Value Date/Time   PHART 7.252 (L) 12/14/2018 1428   PCO2ART 104 (HH) 12/14/2018 1428   PO2ART 120 (H) 12/14/2018 1428   HCO3 42.1 (H) 12/14/2018 1456   TCO2 45 (H) 12/14/2018 1456   O2SAT 68.0 12/14/2018 1456     Coagulation Profile: No results for input(s): INR, PROTIME in the last 168 hours.  Cardiac Enzymes: Recent Labs  Lab 12/14/18 1325  TROPONINI <0.03    HbA1C: Hgb A1c MFr Bld  Date/Time Value Ref Range Status  09/15/2014 06:55 PM 5.9 (H) <5.7 % Final    Comment:    (NOTE)                                                                       According to the ADA Clinical Practice Recommendations for 2011, when HbA1c is used as a screening test:  >=6.5%   Diagnostic of Diabetes Mellitus           (if abnormal result is confirmed) 5.7-6.4%   Increased risk of developing Diabetes Mellitus References:Diagnosis and Classification of Diabetes Mellitus,Diabetes Care,2011,34(Suppl 1):S62-S69 and Standards of Medical Care in         Diabetes - 2011,Diabetes Care,2011,34 (Suppl 1):S11-S61.     CBG: Recent Labs  Lab 12/14/18 1318  GLUCAP 111*    Review of Systems:   Review of Systems  Unable to perform ROS: Mental status change     Past Medical History  She,  has a past medical history of COPD (chronic obstructive pulmonary disease) (HCC), Esophageal stricture, Falls, Gait disorder (10/26/2014), Glaucoma, History of tobacco abuse, HOH (hard of hearing), Hyperlipidemia, Hypertension, and Memory disorder (10/26/2014).   Surgical History    Past Surgical History:  Procedure Laterality Date  . APPENDECTOMY    . BALLOON DILATION N/A 09/18/2014   Procedure: BALLOON DILATION;  Surgeon: Shirley Friar, MD;  Location: [facility] ENDOSCOPY;  Service: Endoscopy;  Laterality: N/A;  . CATARACT EXTRACTION Bilateral   . childbirth     x  3  . ESOPHAGOGASTRODUODENOSCOPY N/A 09/18/2014   Procedure: ESOPHAGOGASTRODUODENOSCOPY (EGD);  Surgeon: Shirley Friar, MD;  Location: [facility] ENDOSCOPY;  Service: Endoscopy;  Laterality: N/A;  . ESOPHAGOGASTRODUODENOSCOPY (EGD) WITH PROPOFOL N/A 09/22/2014   Procedure: ESOPHAGOGASTRODUODENOSCOPY (EGD) WITH PROPOFOL;  Surgeon: Vertell Novak., MD;  Location: [facility] ENDOSCOPY;  Service: Endoscopy;  Laterality: N/A;  . SAVORY DILATION N/A 09/18/2014   Procedure: SAVORY DILATION;  Surgeon: Shirley Friar, MD;  Location: [facility] ENDOSCOPY;  Service: Endoscopy;  Laterality: N/A;  no  fluro needed  . SAVORY DILATION N/A 09/22/2014   Procedure: SAVORY DILATION;  Surgeon: Vertell Novak., MD;  Location: [facility] ENDOSCOPY;  Service: Endoscopy;  Laterality: N/A;  . TONSILLECTOMY AND ADENOIDECTOMY       Social History   reports that she has quit smoking. Her smoking use included cigarettes. She smoked 0.25 packs per day. She has never used smokeless tobacco. She reports that she does not drink alcohol or use drugs.   Family History   Her family history is not on file.   Allergies Allergies  Allergen Reactions  . Codeine Other (See Comments)    Unknown rxn     Critical care time: 35 minutes of critical care time spent evaluating patient, reviewing records, formulating plan of care, discussing with patient's spouse

## 2018-12-14 NOTE — TOC Transition Note (Signed)
TOC CM/SW note:   [facility] aware of pt being in [facility] ED and they are following. Pt was dc from ED on 12/12/2018 with HH. Isidoro Donning RN CCM Case Mgmt phone [phone]

## 2018-12-15 ENCOUNTER — Other Ambulatory Visit: Payer: Self-pay

## 2018-12-15 LAB — CBC
HCT: 42.6 % (ref 36.0–46.0)
Hemoglobin: 12.7 g/dL (ref 12.0–15.0)
MCH: 28.2 pg (ref 26.0–34.0)
MCHC: 29.8 g/dL — AB (ref 30.0–36.0)
MCV: 94.7 fL (ref 80.0–100.0)
Platelets: 215 10*3/uL (ref 150–400)
RBC: 4.5 MIL/uL (ref 3.87–5.11)
RDW: 12.2 % (ref 11.5–15.5)
WBC: 12.2 10*3/uL — ABNORMAL HIGH (ref 4.0–10.5)
nRBC: 0 % (ref 0.0–0.2)

## 2018-12-15 LAB — BASIC METABOLIC PANEL
Anion gap: 12 (ref 5–15)
BUN: 21 mg/dL (ref 8–23)
CO2: 34 mmol/L — ABNORMAL HIGH (ref 22–32)
CREATININE: 0.46 mg/dL (ref 0.44–1.00)
Calcium: 9.4 mg/dL (ref 8.9–10.3)
Chloride: 86 mmol/L — ABNORMAL LOW (ref 98–111)
GFR calc Af Amer: >60 mL/min (ref >60)
GFR calc non Af Amer: >60 mL/min (ref >60)
Glucose, Bld: 136 mg/dL — ABNORMAL HIGH (ref 70–99)
Potassium: 4.1 mmol/L (ref 3.5–5.1)
Sodium: 132 mmol/L — ABNORMAL LOW (ref 135–145)

## 2018-12-15 LAB — BLOOD GAS, ARTERIAL
Acid-Base Excess: 13.2 mmol/L — ABNORMAL HIGH (ref 0.0–2.0)
Bicarbonate: 38.2 mmol/L — ABNORMAL HIGH (ref 20.0–28.0)
Delivery systems: POSITIVE
Drawn by: 514251
Expiratory PAP: 5
FIO2: 24
Inspiratory PAP: 15
O2 Saturation: 96.2 %
PATIENT TEMPERATURE: 37
PO2 ART: 74.1 mmHg — AB (ref 83.0–108.0)
RATE: 18 resp/min
pCO2 arterial: 47.9 mmHg (ref 32.0–48.0)
pH, Arterial: 7.513 — ABNORMAL HIGH (ref 7.350–7.450)

## 2018-12-15 LAB — MAGNESIUM: Magnesium: 2.4 mg/dL (ref 1.7–2.4)

## 2018-12-15 LAB — PHOSPHORUS: Phosphorus: 3.1 mg/dL (ref 2.5–4.6)

## 2018-12-15 MED ORDER — IBUPROFEN 200 MG PO TABS: 400.0000 mg | ORAL_TABLET | ORAL | Status: DC | PRN

## 2018-12-15 MED ORDER — UMECLIDINIUM-VILANTEROL 62.5-25 MCG/INH IN AEPB
1.0000 | INHALATION_SPRAY | Freq: Every day | RESPIRATORY_TRACT | Status: DC
  Administered 2018-12-15 – 2018-12-19 (×4): 1 via RESPIRATORY_TRACT
  Filled 2018-12-15: qty 14

## 2018-12-15 MED ORDER — SODIUM CHLORIDE 0.9 % IV BOLUS
1000.0000 mL | Freq: Once | INTRAVENOUS | Status: AC
  Administered 2018-12-15: 1000 mL via INTRAVENOUS

## 2018-12-15 MED ORDER — LIP MEDEX EX OINT
TOPICAL_OINTMENT | CUTANEOUS | Status: AC
  Administered 2018-12-15: 11:00:00
  Filled 2018-12-15: qty 7

## 2018-12-15 MED ORDER — ALBUTEROL SULFATE (2.5 MG/3ML) 0.083% IN NEBU: 2.5000 mg | INHALATION_SOLUTION | RESPIRATORY_TRACT | Status: DC | PRN

## 2018-12-15 MED ORDER — SERTRALINE HCL 25 MG PO TABS
25.0000 mg | ORAL_TABLET | Freq: Every day | ORAL | Status: DC
  Administered 2018-12-15 – 2018-12-18 (×4): 25 mg via ORAL
  Filled 2018-12-15 (×4): qty 1

## 2018-12-15 NOTE — Evaluation (Signed)
Physical Therapy Evaluation Patient Details Name: Laurie GipJoan D Horne MRN: [id number] DOB: 02/07/1934 Today's Date: 12/15/2018   History of Present Illness  85 yo female admitted to ED on 3/22 with AMS from home, suspected due to medication complications. Pt with recent ED admission 3/20 with R tibial plateau fracture, pt sent home in a R knee immobilizer. PMH includes COPD, esophageal stricture with dilation, falls, gait abnormality, HOH, HLD, HTN, memory disorder, suicide attempt, anxiety/depression, ICH 2015.  Clinical Impression   Pt presents with RLE pain, impaired cognition, difficulty performing mobility tasks, decreased safety awareness with mobility, inconsistent command following, dyspnea and desaturation with short periods of activity, and decreased activity tolerance. Pt to benefit from acute PT to address deficits. Pt ambulated 5 ft in room, PT with intent to have pt perform stand pivot only to recliner, but pt did not follow command and began walking towards door. Pt redirected to recliner, with TDWB on RLE as pt unable to follow NWB. Pt also has been home for 2 days with use of cane, per pt report. Give pt's presentation this session, PT recommending SNF placement to address mobility deficits. PT unsure of pt's true baseline and the assist her husband is able to provide to her. PT to progress mobility as tolerated, and will continue to follow acutely.      Follow Up Recommendations SNF;Home health PT;Supervision/Assistance - 24 hour    Equipment Recommendations  Other (comment)(will reassess next session)    Recommendations for Other Services       Precautions / Restrictions Precautions Precautions: Fall Required Braces or Orthoses: Knee Immobilizer - Right Knee Immobilizer - Right: On at all times(Unsure, assuming on at all times until PT hears otherwise) Restrictions Weight Bearing Restrictions: (unsure of WB precautions, none documented in orders or notes at this time. RN unsure  of if there are precautions)      Mobility  Bed Mobility Overal bed mobility: Needs Assistance Bed Mobility: Supine to Sit     Supine to sit: Min assist;HOB elevated     General bed mobility comments: Min assist for RLE lifting and translation to EOB, pt with increased time for scooting to EOB.   Transfers Overall transfer level: Needs assistance Equipment used: Rolling walker (2 wheeled) Transfers: Sit to/from Stand Sit to Stand: Mod assist;From elevated surface         General transfer comment: Assist for power up, steadying. Due to not know WB status of RLE, pt encouraged not to WB on RLE. Pt with touchdown weight bearing (TDWB), unable to be corrected by PT with verbal cuing.   Ambulation/Gait Ambulation/Gait assistance: Min guard Gait Distance (Feet): 5 Feet Assistive device: Rolling walker (2 wheeled) Gait Pattern/deviations: Step-to pattern;Decreased stance time - right;Decreased weight shift to left;Antalgic;Trunk flexed;Drifts right/left Gait velocity: decr    General Gait Details: Min guard for safety. multimodal cuing to not WB on RLE until WB status is cleared up, pt with TDWB anyway. Pt told to stand pivot to recliner at bedside, but pt continued to ambulate to door of room. Pt redirected with PT assist to translate RW to recliner at bedside.   Stairs            Wheelchair Mobility    Modified Rankin (Stroke Patients Only)       Balance Overall balance assessment: Needs assistance;History of Falls Sitting-balance support: No upper extremity supported;Feet supported Sitting balance-Leahy Scale: Good     Standing balance support: Bilateral upper extremity supported Standing balance-Leahy Scale: Poor Standing  balance comment: relies on RW for support                              Pertinent Vitals/Pain Pain Assessment: Faces Faces Pain Scale: Hurts little more Pain Location: RLE, with mobility  Pain Descriptors / Indicators:  Grimacing;Guarding Pain Intervention(s): Limited activity within patient's tolerance;Monitored during session;Repositioned    Home Living Family/patient expects to be discharged to:: Private residence Living Arrangements: Spouse/significant other Available Help at Discharge: Family Type of Home: House Home Access: Stairs to enter Entrance Stairs-Rails: Left Entrance Stairs-Number of Steps: 2 Home Layout: Two level Home Equipment: Cane - single point      Prior Function Level of Independence: Needs assistance   Gait / Transfers Assistance Needed: Unsure of assist level provided by husband at home, pt unable to speak on this. Pt does report using cane for ambulation after tibial plateau fracture. Pt reports she has had 2 falls, but does not know the timeline of them and states she does not remember her most recent fall.  ADL's / Homemaking Assistance Needed: Unsure of assist level provided by husband, not at bedside and pt unable to report what he assists with.   Comments: pt reports wearing oxygen at home, but she is unable to state if she wears it all the time or how many liters. She states "I don't know, ask my husband"     Hand Dominance   Dominant Hand: Left    Extremity/Trunk Assessment   Upper Extremity Assessment Upper Extremity Assessment: Generalized weakness    Lower Extremity Assessment Lower Extremity Assessment: Generalized weakness;RLE deficits/detail RLE Deficits / Details: Pt in R KI, unsure of WB status or ability to remove brace RLE: Unable to fully assess due to immobilization    Cervical / Trunk Assessment Cervical / Trunk Assessment: Normal  Communication   Communication: No difficulties  Cognition Arousal/Alertness: Awake/alert Behavior During Therapy: Restless                                   General Comments: Pt with documented history of memory loss. Pt initially oriented to self and place, but not situation. 10 minutes into  eval, pt did not know where she was. Pt does not recall if she wears O2 all the time at home, what the circumstances of her falls have been, or why she is here.      General Comments General comments (skin integrity, edema, etc.): Pt instructed to perform stand pivot to recliner at bedside, so PT briefly removed O2 in preparation to go to chair, at which time O2 was reapplied. Pt ended up walking towards door, was quickly redirected by PT, placed back on O2, and was satting 75%. Pt placed on 3L to recover sats, quickly recovered to mid90s. Pt replaced on 2LO2.     Exercises     Assessment/Plan    PT Assessment Patient needs continued PT services  PT Problem List Decreased strength;Decreased mobility;Decreased safety awareness;Decreased range of motion;Decreased knowledge of precautions;Decreased activity tolerance;Decreased cognition;Decreased balance;Decreased knowledge of use of DME;Pain       PT Treatment Interventions DME instruction;Functional mobility training;Gait training;Therapeutic activities;Neuromuscular re-education;Therapeutic exercise;Stair training;Balance training;Patient/family education    PT Goals (Current goals can be found in the Care Plan section)  Acute Rehab PT Goals Patient Stated Goal: I want to walk PT Goal Formulation: With patient Time For Goal  Achievement: 12/29/18 Potential to Achieve Goals: Good    Frequency Min 3X/week   Barriers to discharge        Co-evaluation               AM-PAC PT "6 Clicks" Mobility  Outcome Measure Help needed turning from your back to your side while in a flat bed without using bedrails?: A Little Help needed moving from lying on your back to sitting on the side of a flat bed without using bedrails?: A Little Help needed moving to and from a bed to a chair (including a wheelchair)?: A Little Help needed standing up from a chair using your arms (e.g., wheelchair or bedside chair)?: A Little Help needed to walk in  hospital room?: A Little Help needed climbing 3-5 steps with a railing? : Total 6 Click Score: 16    End of Session Equipment Utilized During Treatment: Gait belt;Oxygen;Right knee immobilizer Activity Tolerance: Patient limited by pain;Patient limited by fatigue Patient left: in chair;with chair alarm set;with call bell/phone within reach Nurse Communication: Mobility status PT Visit Diagnosis: History of falling (Z91.81);Other abnormalities of gait and mobility (R26.89);Muscle weakness (generalized) (M62.81)    Time: 1761-6073 PT Time Calculation (min) (ACUTE ONLY): 25 min   Charges:   PT Evaluation $PT Eval Low Complexity: 1 Low PT Treatments $Gait Training: 8-22 mins        Nicola Police, PT Acute Rehabilitation Services Pager [phone]  Office [phone]   Tyrone Apple D Despina Hidden 12/15/2018, 5:27 PM

## 2018-12-15 NOTE — Progress Notes (Signed)
NAME:  Laurie Horne, MRN:  [id number], DOB:  Aug 03, 1934, LOS: 1 ADMISSION DATE:  12/14/2018, CONSULTATION DATE:  12/15/2018 REFERRING MD:  Estell Harpin, CHIEF COMPLAINT:  Acute encephalopathy, hypercarbia   Brief History   85 y/o female with recently hospitalized for tibial plateau fracture discharged on hydrocodone and Xanax.  Found obtunded by spouse, required narcan, flumazenil, BIPAP.  Past Medical History  COPD, Glaucoma, Hypertension, HLD  Significant Hospital Events     Consults:  PCCM 12/14/2018  Procedures:    Significant Diagnostic Tests:  3/22 Head CT > NAICP  Micro Data:  3/22 blood culture>   Antimicrobials:    Interim history/subjective:  Has not been on BIPAP all night Feels OK Denies suicidality  Objective   Blood pressure 118/63, pulse 60, temperature 98.6 F (37 C), temperature source Oral, resp. rate (!) 25, height 5\' 6"  (1.676 m), weight 41.7 kg, SpO2 92 %.    FiO2 (%):  [24 %] 24 %  No intake or output data in the 24 hours ending 12/15/18 1055 Filed Weights   12/14/18 2000 12/15/18 0428  Weight: 41.7 kg 41.7 kg    Examination:  General:  Resting comfortably in bed HENT: NCAT OP clear PULM: CTA B, normal effort CV: RRR, no mgr GI: BS+, soft, nontender MSK: normal bulk and tone Neuro: awake, alert, no distress, MAEW   Resolved Hospital Problem list     Assessment & Plan:  Hypercarbic respiratory failure due to narcotic overdose, unintentional Likely related to a combination of underlying COPD, opiate and benzodiazepine use > hold opiates, benzos > titrate O2 to maintain O2 saturation 88-92% > monitor off of BIPAP  COPD: This is the cause of her chronic hypercarbia > add Anoro today > albuterol prn  Patella fracture > use NSAIDS for pain > adding ibuprofent > mobilize  Anxiety/depression > add sertraline > hold xanax  Oliguria > advance diet > monitor BMET, UOP  Best practice:  Diet:  Pain/Anxiety/Delirium protocol (if  indicated): n/a VAP protocol (if indicated): n/a DVT prophylaxis: lovenox GI prophylaxis: n/a Glucose control: SSI Mobility: out of bed Code Status: full Family Communication: Updated her husband by phone on 3/23 Disposition: out of ICU, to [facility]  Labs   CBC: Recent Labs  Lab 12/12/18 1126 12/14/18 1325 12/14/18 1456 12/15/18 0356  WBC 14.8* 16.0*  --  12.2*  NEUTROABS 11.2* 12.5*  --   --   HGB 13.4 12.8 12.9 12.7  HCT 45.1 43.6 38.0 42.6  MCV 96.8 97.1  --  94.7  PLT 232 214  --  215    Basic Metabolic Panel: Recent Labs  Lab 12/12/18 1126 12/14/18 1325 12/14/18 1456 12/14/18 1457 12/15/18 0356  NA 132* 134* 133*  --  132*  K 4.4 4.1 4.6  --  4.1  CL 87* 85*  --   --  86*  CO2 37* 41*  --   --  34*  GLUCOSE 109* 116*  --   --  136*  BUN 7* 13  --   --  21  CREATININE 0.47 0.37*  --  0.40* 0.46  CALCIUM 9.0 9.5  --   --  9.4  MG  --   --   --   --  2.4  PHOS  --   --   --   --  3.1   GFR: Estimated Creatinine Clearance: 35.7 mL/min (by C-G formula based on SCr of 0.46 mg/dL). Recent Labs  Lab 12/12/18 1126 12/14/18 1325 12/14/18 2003  12/15/18 0356  WBC 14.8* 16.0*  --  12.2*  LATICACIDVEN  --  0.7 1.9  --     Liver Function Tests: Recent Labs  Lab 12/14/18 1325  AST 18  ALT 12  ALKPHOS 75  BILITOT 0.7  PROT 7.6  ALBUMIN 3.5   No results for input(s): LIPASE, AMYLASE in the last 168 hours. No results for input(s): AMMONIA in the last 168 hours.  ABG    Component Value Date/Time   PHART 7.513 (H) 12/15/2018 0356   PCO2ART 47.9 12/15/2018 0356   PO2ART 74.1 (L) 12/15/2018 0356   HCO3 38.2 (H) 12/15/2018 0356   TCO2 45 (H) 12/14/2018 1456   O2SAT 96.2 12/15/2018 0356     Coagulation Profile: No results for input(s): INR, PROTIME in the last 168 hours.  Cardiac Enzymes: Recent Labs  Lab 12/14/18 1325  TROPONINI <0.03    HbA1C: Hgb A1c MFr Bld  Date/Time Value Ref Range Status  09/15/2014 06:55 PM 5.9 (H) <5.7 % Final     Comment:    (NOTE)                                                                       According to the ADA Clinical Practice Recommendations for 2011, when HbA1c is used as a screening test:  >=6.5%   Diagnostic of Diabetes Mellitus           (if abnormal result is confirmed) 5.7-6.4%   Increased risk of developing Diabetes Mellitus References:Diagnosis and Classification of Diabetes Mellitus,Diabetes Care,2011,34(Suppl 1):S62-S69 and Standards of Medical Care in         Diabetes - 2011,Diabetes Care,2011,34 (Suppl 1):S11-S61.     CBG: Recent Labs  Lab 12/14/18 1318  GLUCAP 111*   Heber Faith, MD [place] [facility] Pager: [phone] Cell: [phone] If no response, call [phone]

## 2018-12-15 NOTE — Progress Notes (Signed)
eLink Physician-Brief Progress Note Patient Name: Laurie Horne DOB: [birth year] MRN: [id number]   Date of Service  12/15/2018  HPI/Events of Note  Oliguria - Bladder scan = 0 mL. LVEF = 60-65%   eICU Interventions  Will order: 1. Bolus with 0.9 NaCl 1 liter IV over 1 hour now.      Intervention Category Intermediate Interventions: Oliguria - evaluation and management  [name] Eugene 12/15/2018, 4:36 AM

## 2018-12-15 NOTE — Progress Notes (Signed)
Pt transferred to 1518 in stable condition.

## 2018-12-16 DIAGNOSIS — I10 Essential (primary) hypertension: Secondary | ICD-10-CM

## 2018-12-16 DIAGNOSIS — T50901S Poisoning by unspecified drugs, medicaments and biological substances, accidental (unintentional), sequela: Secondary | ICD-10-CM

## 2018-12-16 DIAGNOSIS — F329 Major depressive disorder, single episode, unspecified: Secondary | ICD-10-CM

## 2018-12-16 DIAGNOSIS — E43 Unspecified severe protein-calorie malnutrition: Secondary | ICD-10-CM

## 2018-12-16 NOTE — Progress Notes (Signed)
I reviewed the patients Xrays.  Recommend touchdown weightbearing with a knee immobilizer and f/u in my office in 2wks to repeat xrays. Please call with questions.  Jackquline Bosch [facility] [phone]

## 2018-12-16 NOTE — NC FL2 (Signed)
[place] MEDICAID FL2 LEVEL OF CARE SCREENING TOOL     IDENTIFICATION  Patient Name: Laurie Horne Birthdate: [birth year] Sex: female Admission Date (Current Location): 12/14/2018  [facility] and [place] Number:  Producer, television/film/video and Address:  [facility],  [address], [place] [address]      Provider Number: [id number]  Attending Physician Name and Address:  Fran Lowes, DO  Relative Name and Phone Number:       Current Level of Care: Hospital Recommended Level of Care: Skilled Nursing Facility Prior Approval Number:    Date Approved/Denied:   PASRR Number: [id number]  Discharge Plan: SNF    Current Diagnoses: Patient Active Problem List   Diagnosis Date Noted  . Memory disorder 10/26/2014  . Gait disorder 10/26/2014  . Acute encephalopathy 09/29/2014  . Bradycardia, sinus   . Diarrhea   . Suicide attempt (HCC)   . Traumatic intracranial hemorrhage (HCC)   . Metabolic encephalopathy   . Hyperkalemia   . Anxiety state   . Skin lesion of right lower extremity   . Acute on chronic respiratory failure with hypercapnia (HCC)   . Adverse reaction to beta-blocker   . Altered awareness, transient   . Sinus bradycardia   . Other specified hypotension   . Essential hypertension   . Hyponatremia   . Protein-calorie malnutrition (HCC)   . Nicotine abuse   . Intracranial bleeding (HCC)   . Anxiety 09/08/2014  . Depression 09/08/2014  . ICH (intracerebral hemorrhage) (HCC) 09/07/2014  . Protein-calorie malnutrition, severe (HCC) 11/26/2013  . Esophageal dysphagia 11/26/2013  . Staphylococcus aureus bronchitis 11/26/2013  . Respiratory failure with hypercapnia (HCC) 11/21/2013  . Fall 11/21/2013  . Lethargic 11/21/2013  . Respiratory failure (HCC) 11/21/2013  . History of tobacco abuse     Orientation RESPIRATION BLADDER Height & Weight     Self, Situation  O2 Continent Weight: 96 lb 3.2 oz (43.6 kg) Height:  5\' 6"  (167.6 cm)  BEHAVIORAL  SYMPTOMS/MOOD NEUROLOGICAL BOWEL NUTRITION STATUS      Continent Diet(See dc summary)  AMBULATORY STATUS COMMUNICATION OF NEEDS Skin   Extensive Assist   Normal                       Personal Care Assistance Level of Assistance  Bathing, Feeding, Dressing Bathing Assistance: Limited assistance Feeding assistance: Independent Dressing Assistance: Limited assistance     Functional Limitations Info  Sight, Hearing, Speech Sight Info: Adequate Hearing Info: Adequate      SPECIAL CARE FACTORS FREQUENCY  PT (By licensed PT), OT (By licensed OT)     PT Frequency: 5x/week OT Frequency: 5x/week            Contractures Contractures Info: Not present    Additional Factors Info  Code Status, Allergies Code Status Info: DNR Allergies Info: Codeine           Current Medications (12/16/2018):  This is the current hospital active medication list Current Facility-Administered Medications  Medication Dose Route Frequency Provider Last Rate Last Dose  . albuterol (PROVENTIL) (2.5 MG/3ML) 0.083% nebulizer solution 2.5 mg  2.5 mg Nebulization Q4H PRN Max Fickle B, MD      . amLODipine (NORVASC) tablet 5 mg  5 mg Oral Daily Max Fickle B, MD   5 mg at 12/16/18 0919  . aspirin EC tablet 81 mg  81 mg Oral Daily Max Fickle B, MD   81 mg at 12/16/18 0919  . chlorhexidine (  PERIDEX) 0.12 % solution 15 mL  15 mL Mouth Rinse BID Max Fickle B, MD   15 mL at 12/16/18 0920  . dorzolamide-timolol (COSOPT) 22.3-6.8 MG/ML ophthalmic solution 1 drop  1 drop Both Eyes BID Max Fickle B, MD   1 drop at 12/16/18 0920  . enoxaparin (LOVENOX) injection 40 mg  40 mg Subcutaneous Q24H Max Fickle B, MD   40 mg at 12/15/18 2215  . ibuprofen (ADVIL,MOTRIN) tablet 400 mg  400 mg Oral Q4H PRN Max Fickle B, MD      . latanoprost (XALATAN) 0.005 % ophthalmic solution 1 drop  1 drop Both Eyes QHS Max Fickle B, MD   1 drop at 12/15/18 2216  . MEDLINE mouth rinse  15  mL Mouth Rinse q12n4p Max Fickle B, MD      . sertraline (ZOLOFT) tablet 25 mg  25 mg Oral QHS Max Fickle B, MD   25 mg at 12/15/18 2215  . umeclidinium-vilanterol (ANORO ELLIPTA) 62.5-25 MCG/INH 1 puff  1 puff Inhalation Daily Lupita Leash, MD   1 puff at 12/16/18 4193     Discharge Medications: Please see discharge summary for a list of discharge medications.  Relevant Imaging Results:  Relevant Lab Results:   Additional Information ssn: [id number]  Coralyn Helling, LCSW

## 2018-12-16 NOTE — Progress Notes (Signed)
  PROGRESS NOTE  Laurie Horne VQM:[id number] DOB: [birth year] DOA: 12/14/2018 PCP: Elizabeth Palau, FNP  Brief History   The patient is a 85 yr old woman who was discharged from the [facility] 12/11/2018 with a diagnosis of right tibial plateau fracture.  She ws discharged with a prescription for hydrocodone. She also takes Xanax. The patient was admitted to the ICU on 12/14/2018 obtunded due to inadvertent overdose of narcotics.  She was in hypercapneic respiratory failure and was intubated in the ED. The patient was extubated on 12/15/2018 and transferred to the floor. I contacted orthopedic surgery today and confirmed that the patient should be on "toe touch" weight bearing limitations. She is being evaluated by PT/OT today.  Consultants  . None  Procedures  . None  Antibiotics  . None  Interval History/Subjective  See above  Objective   Vitals:  Vitals:   12/16/18 0926 12/16/18 1255  BP:  125/66  Pulse:  (!) 59  Resp:  (!) 22  Temp:  98 F (36.7 C)  SpO2: 94% 99%    Exam:  Constitutional:  . The patient is awake, alert, and oriented x 3. No acute distress. Respiratory:  . No wheezes, rales, or rhonchi are appreciated. . No increased work of breathing. . No tactile fremitus/ Cardiovascular:  . Regular rate and rhythm. . No murmurs, ectopy, or gallups. . No lateral PMI. No thrills. Abdomen:  . Abdomen is soft, non-tender, non-distended . No hernias, masses, or organomegaly. . Normoactive bowel sounds. Musculoskeletal:  . No cyanosis, clubbing or edema Skin:  . No rashes, lesions, ulcers . palpation of skin: no induration or nodules Neurologic:  . CN 2-12 intact . Sensation all 4 extremities intact Psychiatric:  . Mental status o Mood, affect appropriate o Orientation to person, place, time  . judgment and insight appear intact    I have personally reviewed the following:   Today's Data  . Vitals, blood cultures  Scheduled Meds: . amLODipine  5 mg Oral  Daily  . aspirin EC  81 mg Oral Daily  . chlorhexidine  15 mL Mouth Rinse BID  . dorzolamide-timolol  1 drop Both Eyes BID  . enoxaparin (LOVENOX) injection  40 mg Subcutaneous Q24H  . latanoprost  1 drop Both Eyes QHS  . mouth rinse  15 mL Mouth Rinse q12n4p  . sertraline  25 mg Oral QHS  . umeclidinium-vilanterol  1 puff Inhalation Daily   Continuous Infusions:  Active Problems:   Respiratory failure with hypercapnia (HCC) Tibial plateau fracture Hypertension Depression  A & P   Problem  Drug Overdoses, Accidental Or Unintentional, [place]  Essential Hypertension  Depression  Protein-calorie malnutrition, severe (HCC)  Respiratory Failure With Hypercapnia (Hcc)   Hypercapneic respiratory failure: Resolved  Tibial plateau fracture: Toe touch weight bearing per orthopedic surgery. Patient to be evaluated by PT/OT to determine appropriate disposition. Only tylenol for pain.  Essential Hypertension: continue home doses of Norvasc  Depression: continue Sertraline as at outpatient.  DVT prophylaxis: Lovenox Code Status: DNR Family Communication: None at bedside Disposition Plan: tbd   Gregoria Selvy, DO [facility] Direct contact: see [email or web address]  7PM-7AM contact night coverage as above 12/16/2018, 4:54 PM  LOS: 2 days    LOS: 2 days

## 2018-12-16 NOTE — Progress Notes (Signed)
Physical Therapy Treatment Patient Details Name: Laurie Horne MRN: [id number] DOB: [birth year] Today's Date: 12/16/2018    History of Present Illness 85 yo female admitted to ED on 3/22 with AMS from home, suspected due to medication complications. Pt with recent ED admission 3/20 with R tibial plateau fracture, pt sent home in a R knee immobilizer. PMH includes COPD, esophageal stricture with dilation, falls, gait abnormality, HOH, HLD, HTN, memory disorder, suicide attempt, anxiety/depression, ICH 2015.    PT Comments    Progressing slowly with mobility. Contacted MD (hospitalist) for WB status. Ortho consulted and recommended TDWB and KI for activity. Pt will require continued education on adherence to WB status. Min-Mod assist for mobility on today. Recommend SNF for continued rehab.    Follow Up Recommendations  SNF     Equipment Recommendations  (TBD at next venue)    Recommendations for Other Services       Precautions / Restrictions Precautions Precautions: Fall Required Braces or Orthoses: Knee Immobilizer - Right Knee Immobilizer - Right: On at all times Restrictions Weight Bearing Restrictions: Yes RLE Weight Bearing: Touchdown weight bearing    Mobility  Bed Mobility Overal bed mobility: Needs Assistance Bed Mobility: Supine to Sit;Sit to Supine     Supine to sit: Min assist;HOB elevated Sit to supine: Min assist;HOB elevated   General bed mobility comments: Assist for R LE. Increased time. Cues for safety, technique.   Transfers Overall transfer level: Needs assistance Equipment used: Rolling walker (2 wheeled) Transfers: Sit to/from [employer] Sit to Stand: Mod assist;From elevated surface Stand pivot transfers: Min assist       General transfer comment: Assist to rise, stabilize, control descent. Multimodal cueing required for safety, technique, hand/LE placement, adherence to TDWB status. Pt still has some difficulty maintaining TDWB  consistently.   Ambulation/Gait Min Assist; +2 safety/equipment Gait Distance (Feet): 3 Feet Assistive device: Rolling walker (2 wheeled) Gait Pattern/deviations: Step-to pattern     General Gait Details: Assist to stabilize pt and maneuver safely with RW. Constant cueing for adherence to TDWB status and proper use of RW to aid with WBing.    Stairs             Wheelchair Mobility    Modified Rankin (Stroke Patients Only)       Balance Overall balance assessment: Needs assistance         Standing balance support: Bilateral upper extremity supported Standing balance-Leahy Scale: Poor                              Cognition Arousal/Alertness: Awake/alert Behavior During Therapy: WFL for tasks assessed/performed Overall Cognitive Status: No family/caregiver present to determine baseline cognitive functioning Area of Impairment: Memory;Safety/judgement;Problem solving                     Memory: Decreased short-term memory;Decreased recall of precautions   Safety/Judgement: Decreased awareness of deficits;Decreased awareness of safety   Problem Solving: Requires verbal cues;Requires tactile cues        Exercises      General Comments        Pertinent Vitals/Pain Pain Assessment: Faces Faces Pain Scale: Hurts little more Pain Location: RLE with mobility  Pain Descriptors / Indicators: Grimacing;Discomfort Pain Intervention(s): Limited activity within patient's tolerance;Repositioned    Home Living  Prior Function            PT Goals (current goals can now be found in the care plan section) Progress towards PT goals: Progressing toward goals    Frequency    Min 3X/week      PT Plan Current plan remains appropriate    Co-evaluation              AM-PAC PT "6 Clicks" Mobility   Outcome Measure  Help needed turning from your back to your side while in a flat bed without using  bedrails?: A Little Help needed moving from lying on your back to sitting on the side of a flat bed without using bedrails?: A Little Help needed moving to and from a bed to a chair (including a wheelchair)?: A Little Help needed standing up from a chair using your arms (e.g., wheelchair or bedside chair)?: A Lot Help needed to walk in hospital room?: A Lot Help needed climbing 3-5 steps with a railing? : Total 6 Click Score: 14    End of Session Equipment Utilized During Treatment: Gait belt;Oxygen;Right knee immobilizer Activity Tolerance: Patient tolerated treatment well Patient left: in bed;with call bell/phone within reach;with bed alarm set   PT Visit Diagnosis: History of falling (Z91.81);Other abnormalities of gait and mobility (R26.89);Muscle weakness (generalized) (M62.81);Pain Pain - Right/Left: Right Pain - part of body: Leg     Time: 4315-4008 PT Time Calculation (min) (ACUTE ONLY): 13 min  Charges:  $Therapeutic Activity: 8-22 mins                       Rebeca Alert, PT Acute Rehabilitation Services Pager: [phone] Office: [phone]

## 2018-12-16 NOTE — TOC Progression Note (Signed)
Transition of Care [facility]) - Progression Note    Patient Details  Name: Laurie Horne MRN: [id number] Date of Birth: Apr 11, 1934  [facility]) CM/SW Contact  Coralyn Helling, LCSW Phone Number: 12/16/2018, 10:44 AM  Clinical Narrative:    Patient dc last week with home health. Patient readmitted on 3/22. Patient and spouse agreeable to SNF at dc.   Patient is from home with spouse. Patient uses a cane to ambulate and requires assistance with ADLs. Patient has stairs in the home and a few to enter the home.   LCSW faxed patient out to SNFs.       Expected Discharge Plan and Services    SNF at dc.                                  Social Determinants of Health (SDOH) Interventions    Readmission Risk Interventions Readmission Risk Prevention Plan 12/16/2018  Post Dischage Appt Complete  Medication Screening Complete  Transportation Screening Complete  Some recent data might be hidden

## 2018-12-17 DIAGNOSIS — S82141D Displaced bicondylar fracture of right tibia, subsequent encounter for closed fracture with routine healing: Secondary | ICD-10-CM

## 2018-12-17 MED ORDER — ENSURE ENLIVE PO LIQD
237.0000 mL | Freq: Three times a day (TID) | ORAL | Status: DC
  Administered 2018-12-17 – 2018-12-19 (×6): 237 mL via ORAL

## 2018-12-17 NOTE — Progress Notes (Signed)
Physical Therapy Treatment Patient Details Name: Laurie Horne MRN: [id number] DOB: 1934-07-27 Today's Date: 12/17/2018    History of Present Illness 85 yo female admitted to ED on 3/22 with AMS from home, suspected due to medication complications. Pt with recent ED admission 3/20 with R tibial plateau fracture, pt sent home in a R knee immobilizer. PMH includes COPD, esophageal stricture with dilation, falls, gait abnormality, HOH, HLD, HTN, memory disorder, suicide attempt, anxiety/depression, ICH 2015.    PT Comments    Pt appears very drowsy/lethargic on today. Performed bed level exercises only on today. Will continue to follow. Will need SNF.    Follow Up Recommendations  SNF     Equipment Recommendations  (TBD at next venue)    Recommendations for Other Services       Precautions / Restrictions Precautions Precautions: Fall Required Braces or Orthoses: Knee Immobilizer - Right Knee Immobilizer - Right: On at all times Restrictions Weight Bearing Restrictions: Yes RLE Weight Bearing: Touchdown weight bearing    Mobility  Bed Mobility               General bed mobility comments: NT-pt too drowsy to attempt OOB. She responded to questions but kept eyes closed  Transfers                    Ambulation/Gait                 Stairs             Wheelchair Mobility    Modified Rankin (Stroke Patients Only)       Balance                                            Cognition Arousal/Alertness: Lethargic/Drowsy Behavior During Therapy: Flat affect Overall Cognitive Status: No family/caregiver present to determine baseline cognitive functioning                                        Exercises General Exercises - Upper Extremity Shoulder Flexion: AROM;AAROM;Both;10 reps;Supine General Exercises - Lower Extremity Ankle Circles/Pumps: AROM;Right;10 reps;Supine Heel Slides: AAROM;Left;10  reps;Supine Hip ABduction/ADduction: AAROM;Both;10 reps;Supine    General Comments        Pertinent Vitals/Pain Pain Assessment: Faces Faces Pain Scale: No hurt    Home Living                      Prior Function            PT Goals (current goals can now be found in the care plan section) Progress towards PT goals: Progressing toward goals    Frequency    Min 3X/week      PT Plan Current plan remains appropriate    Co-evaluation              AM-PAC PT "6 Clicks" Mobility   Outcome Measure  Help needed turning from your back to your side while in a flat bed without using bedrails?: A Little Help needed moving from lying on your back to sitting on the side of a flat bed without using bedrails?: A Little Help needed moving to and from a bed to a chair (including a wheelchair)?: A Little Help needed standing up from a chair  using your arms (e.g., wheelchair or bedside chair)?: A Lot Help needed to walk in hospital room?: A Lot Help needed climbing 3-5 steps with a railing? : Total 6 Click Score: 14    End of Session Equipment Utilized During Treatment: Oxygen;Right knee immobilizer Activity Tolerance: Patient tolerated treatment well Patient left: in bed;with call bell/phone within reach;with bed alarm set   PT Visit Diagnosis: History of falling (Z91.81);Other abnormalities of gait and mobility (R26.89);Muscle weakness (generalized) (M62.81);Pain Pain - Right/Left: Right Pain - part of body: Leg     Time: 0109-3235 PT Time Calculation (min) (ACUTE ONLY): 8 min  Charges:  $Therapeutic Exercise: 8-22 mins                       Rebeca Alert, PT Acute Rehabilitation Services Pager: [phone] Office: [phone]

## 2018-12-17 NOTE — TOC Initial Note (Signed)
Transition of Care [facility]) - Initial/Assessment Note    Patient Details  Name: Laurie Horne MRN: [id number] Date of Birth: 1934-09-06  Transition of Care [facility]) CM/SW Contact:    Coralyn Helling, LCSW Phone Number: 12/17/2018, 2:48 PM  Clinical Narrative:                   Expected Discharge Plan: Skilled Nursing Facility Barriers to Discharge: Continued Medical Work up   Patient Goals and CMS Choice   CMS Medicare.gov Compare Post Acute Care list provided to:: Patient Represenative (must comment)(Left in patients room. Spoke with son Sidonie Dickens) Choice offered to / list presented to : Patient, Spouse, Adult Children  Expected Discharge Plan and Services Expected Discharge Plan: Skilled Nursing Facility                                  Prior Living Arrangements/Services     Patient language and need for interpreter reviewed:: No        Need for Family Participation in Patient Care: Yes (Comment) Care giver support system in place?: Yes (comment)   Criminal Activity/Legal Involvement Pertinent to Current Situation/Hospitalization: No - Comment as needed  Activities of Daily Living Home Assistive Devices/Equipment: Cane (specify quad or straight) ADL Screening (condition at time of admission) Patient's cognitive ability adequate to safely complete daily activities?: No Is the patient deaf or have difficulty hearing?: Yes Does the patient have difficulty seeing, even when wearing glasses/contacts?: No Does the patient have difficulty concentrating, remembering, or making decisions?: Yes Patient able to express need for assistance with ADLs?: Yes Does the patient have difficulty dressing or bathing?: Yes Independently performs ADLs?: No Does the patient have difficulty walking or climbing stairs?: Yes Weakness of Legs: Both Weakness of Arms/Hands: None  Permission Sought/Granted Permission sought to share information with : Facility Medical sales representative, Family  Supports Permission granted to share information with : Yes, Verbal Permission Granted  Share Information with NAME: Jonny Ruiz and Sidonie Dickens  Permission granted to share info w AGENCY: SNFs  Permission granted to share info w Relationship: Sidonie Dickens, son [phone]     Emotional Assessment Appearance:: Appears stated age Attitude/Demeanor/Rapport: Unable to Assess Affect (typically observed): Calm Orientation: : Oriented to Self, Oriented to Place Alcohol / Substance Use: Not Applicable Psych Involvement: No (comment)  Admission diagnosis:  Somnolence [R40.0] Patient Active Problem List   Diagnosis Date Noted  . Drug overdoses, accidental or unintentional, sequela 12/16/2018  . Memory disorder 10/26/2014  . Gait disorder 10/26/2014  . Acute encephalopathy 09/29/2014  . Bradycardia, sinus   . Diarrhea   . Suicide attempt (HCC)   . Traumatic intracranial hemorrhage (HCC)   . Metabolic encephalopathy   . Hyperkalemia   . Anxiety state   . Skin lesion of right lower extremity   . Acute on chronic respiratory failure with hypercapnia (HCC)   . Adverse reaction to beta-blocker   . Altered awareness, transient   . Sinus bradycardia   . Other specified hypotension   . Essential hypertension   . Hyponatremia   . Protein-calorie malnutrition (HCC)   . Nicotine abuse   . Intracranial bleeding (HCC)   . Anxiety 09/08/2014  . Depression 09/08/2014  . ICH (intracerebral hemorrhage) (HCC) 09/07/2014  . Protein-calorie malnutrition, severe (HCC) 11/26/2013  . Esophageal dysphagia 11/26/2013  . Staphylococcus aureus bronchitis 11/26/2013  . Respiratory failure with hypercapnia (HCC) 11/21/2013  . Fall 11/21/2013  . Lethargic  11/21/2013  . Respiratory failure (HCC) 11/21/2013  . History of tobacco abuse    PCP:  Elizabeth Palau, FNP Pharmacy:   [facility] [phone] - [place], [place] - [address] [place] [address] Phone:  [phone] Fax: [phone]     Social Determinants of Health (SDOH) Interventions    Readmission Risk Interventions Readmission Risk Prevention Plan 12/16/2018  Post Dischage Appt Complete  Medication Screening Complete  Transportation Screening Complete  Some recent data might be hidden

## 2018-12-17 NOTE — Progress Notes (Signed)
  PROGRESS NOTE  Laurie Horne HAF:[id number] DOB: [birth year] DOA: 12/14/2018 PCP: Elizabeth Palau, FNP  Brief History   The patient is a 85 yr old woman who was discharged from the [facility] 12/11/2018 with a diagnosis of right tibial plateau fracture.  She ws discharged with a prescription for hydrocodone. She also takes Xanax. The patient was admitted to the ICU on 12/14/2018 obtunded due to inadvertent overdose of narcotics.  She was in hypercapneic respiratory failure and was intubated in the ED. The patient was extubated on 12/15/2018 and transferred to the floor. I contacted orthopedic surgery today and confirmed that the patient should be on "toe touch" weight bearing limitations.   The patient has been evaluated by PT/OT. Their recommendation is for SNF.  Consultants  . None  Procedures  . None  Antibiotics  . None  Interval History/Subjective  See above  Objective   Vitals:  Vitals:   12/17/18 0618 12/17/18 0800  BP: 139/77   Pulse: 62   Resp: (!) 22 20  Temp: 97.6 F (36.4 C)   SpO2: 100%     Exam:  Constitutional:  . The patient is awake, alert, and oriented x 3. No acute distress. Respiratory:  . No wheezes, rales, or rhonchi are appreciated. . No increased work of breathing. . No tactile fremitus/ Cardiovascular:  . Regular rate and rhythm. . No murmurs, ectopy, or gallups. . No lateral PMI. No thrills. Abdomen:  . Abdomen is soft, non-tender, non-distended . No hernias, masses, or organomegaly. . Normoactive bowel sounds. Musculoskeletal:  . No cyanosis, clubbing or edema Skin:  . No rashes, lesions, ulcers . palpation of skin: no induration or nodules Neurologic:  . CN 2-12 intact . Sensation all 4 extremities intact Psychiatric:  . Mental status o Mood, affect Depressed, flat o Orientation to person, place, time  . judgment and insight cannot be evaluated due to patient's reluctance to speak.  I have personally reviewed the following:    Today's Data  . Vitals, blood cultures  Scheduled Meds: . amLODipine  5 mg Oral Daily  . aspirin EC  81 mg Oral Daily  . chlorhexidine  15 mL Mouth Rinse BID  . dorzolamide-timolol  1 drop Both Eyes BID  . enoxaparin (LOVENOX) injection  40 mg Subcutaneous Q24H  . latanoprost  1 drop Both Eyes QHS  . mouth rinse  15 mL Mouth Rinse q12n4p  . sertraline  25 mg Oral QHS  . umeclidinium-vilanterol  1 puff Inhalation Daily   Continuous Infusions:  Active Problems:   Respiratory failure with hypercapnia (HCC)   Protein-calorie malnutrition, severe (HCC)   Depression   Essential hypertension   Drug overdoses, accidental or unintentional, sequela Tibial plateau fracture Hypertension Depression  A & P   No problems updated. Hypercapneic respiratory failure: Resolved  Tibial plateau fracture: Toe touch weight bearing per orthopedic surgery. Patient to be evaluated by PT/OT to determine appropriate disposition. Only tylenol for pain. The patient is awaiting SNF placement. SW has been consulted.  Essential Hypertension: continue home doses of Norvasc  Depression: continue Sertraline as at outpatient.  DVT prophylaxis: Lovenox Code Status: DNR Family Communication: None at bedside Disposition Plan: SNF.   Trinidad Ingle, DO [facility] Direct contact: see [email or web address]  7PM-7AM contact night coverage as above 12/16/2018, 4:54 PM  LOS: 2 days    LOS: 3 days

## 2018-12-17 NOTE — TOC Progression Note (Signed)
Transition of Care [facility]) - Progression Note    Patient Details  Name: Laurie Horne MRN: [id number] Date of Birth: Dec 03, 1933  Transition of Care [facility]) CM/SW Contact  Coralyn Helling, [place] Phone Number: 12/17/2018, 2:44 PM  Clinical Narrative:    LCSW spoke with patients son, Sidonie Dickens and spouse Jonny Ruiz. Family would like for patient to go to rehab in [place] at [facility]. LCSW left message for admissions. Awaiting response. Patient has local bed offers.         Expected Discharge Plan and Services                                     Social Determinants of Health (SDOH) Interventions    Readmission Risk Interventions Readmission Risk Prevention Plan 12/16/2018  Post Dischage Appt Complete  Medication Screening Complete  Transportation Screening Complete  Some recent data might be hidden

## 2018-12-17 NOTE — Care Management Important Message (Signed)
Important Message  Patient Details  Name: Laurie Horne MRN: [id number] Date of Birth: 08/14/1934   Medicare Important Message Given:  Yes    Caren Macadam 12/17/2018, 11:02 AMImportant Message  Patient Details  Name: Laurie Horne MRN: [id number] Date of Birth: 08-21-1934   Medicare Important Message Given:  Yes    Caren Macadam 12/17/2018, 11:02 AM

## 2018-12-17 NOTE — Progress Notes (Signed)
Initial Nutrition Assessment  DOCUMENTATION CODES:   Underweight  INTERVENTION:   Provide Ensure Enlive po TID, each supplement provides 350 kcal and 20 grams of protein  **Pt's husband requests a change of phone number where he can be reached. Prefers his cell phone: [phone].   NUTRITION DIAGNOSIS:   Inadequate oral intake related to (altered cognition) as evidenced by per patient/family report, meal completion < 50%.  GOAL:   Patient will meet greater than or equal to 90% of their needs  MONITOR:   PO intake, Supplement acceptance, Weight trends, Labs, I & O's  REASON FOR ASSESSMENT:   Malnutrition Screening Tool    ASSESSMENT:  85 y.o. patient was admitted to the ICU on 12/14/2018 obtunded due to inadvertent overdose of narcotics.  **RD working remotely**  Patient is currently with altered mental status, unable to provide any history. Spoke with pt's husband on the phone. Seemed overwhelmed but was still able to provide some nutrition history. Pt has not been eating well, husband is concerned that she has been restricting, states she often brings up she has "a belly". Husband concerned that she will continue to lose weight.   Husband cooks most meals for pt and himself. She eats a good breakfast, typically OJ, tea, fried apples, bacon and 1 egg. For lunch she has some fresh fruit. For dinner, he amy make tacos, pizza or soup. Pt like honey, pt's husband states she might sneak in the kitchen and eat the honey at nighttime. States they have Ensure supplements at home, but only vanilla and the patient prefers chocolate. RD to order chocolate Ensure supplements this admission.  Per weight records, pt has weighed ~90-100 lb since 2016. Suspect severe malnutrition is still present but unable to diagnose at this time without NFPE.  Medications reviewed. Labs reviewed: Low Na   NUTRITION - FOCUSED PHYSICAL EXAM:  Unable to perform given department requirement to work  remotely at this time.  Diet Order:   Diet Order            Diet regular Room service appropriate? Yes; Fluid consistency: Thin  Diet effective now              EDUCATION NEEDS:   Not appropriate for education at this time  Skin:  Skin Assessment: Reviewed RN Assessment  Last BM:  3/25  Height:   Ht Readings from Last 1 Encounters:  12/14/18 5\' 6"  (1.676 m)    Weight:   Wt Readings from Last 1 Encounters:  12/16/18 43.6 kg    Ideal Body Weight:  59.1 kg  BMI:  Body mass index is 15.53 kg/m.  Estimated Nutritional Needs:   Kcal:  1500-1700  Protein:  60-70g  Fluid:  1.7L/day  Tilda Franco, MS, RD, LDN [facility] Inpatient Clinical Dietitian Pager: [phone] After Hours Pager: [phone]

## 2018-12-18 LAB — CBC WITH DIFFERENTIAL/PLATELET
Abs Immature Granulocytes: 0.05 10*3/uL (ref 0.00–0.07)
BASOS ABS: 0 10*3/uL (ref 0.0–0.1)
Basophils Relative: 0 %
EOS ABS: 0 10*3/uL (ref 0.0–0.5)
Eosinophils Relative: 0 %
HCT: 38.4 % (ref 36.0–46.0)
Hemoglobin: 11.2 g/dL — ABNORMAL LOW (ref 12.0–15.0)
Immature Granulocytes: 0 %
LYMPHS ABS: 1.2 10*3/uL (ref 0.7–4.0)
Lymphocytes Relative: 10 %
MCH: 28.7 pg (ref 26.0–34.0)
MCHC: 29.2 g/dL — ABNORMAL LOW (ref 30.0–36.0)
MCV: 98.5 fL (ref 80.0–100.0)
Monocytes Absolute: 1.3 10*3/uL — ABNORMAL HIGH (ref 0.1–1.0)
Monocytes Relative: 11 %
NRBC: 0 % (ref 0.0–0.2)
Neutro Abs: 9.4 10*3/uL — ABNORMAL HIGH (ref 1.7–7.7)
Neutrophils Relative %: 79 %
Platelets: 228 10*3/uL (ref 150–400)
RBC: 3.9 MIL/uL (ref 3.87–5.11)
RDW: 11.9 % (ref 11.5–15.5)
WBC: 11.9 10*3/uL — ABNORMAL HIGH (ref 4.0–10.5)

## 2018-12-18 LAB — BASIC METABOLIC PANEL
Anion gap: 7 (ref 5–15)
BUN: 18 mg/dL (ref 8–23)
CO2: 39 mmol/L — ABNORMAL HIGH (ref 22–32)
Calcium: 8.5 mg/dL — ABNORMAL LOW (ref 8.9–10.3)
Chloride: 87 mmol/L — ABNORMAL LOW (ref 98–111)
Creatinine, Ser: <0.3 mg/dL — ABNORMAL LOW (ref 0.44–1.00)
GLUCOSE: 150 mg/dL — AB (ref 70–99)
Potassium: 3.8 mmol/L (ref 3.5–5.1)
Sodium: 133 mmol/L — ABNORMAL LOW (ref 135–145)

## 2018-12-18 MED ORDER — LIP MEDEX EX OINT
TOPICAL_OINTMENT | CUTANEOUS | Status: AC
  Administered 2018-12-18: 18:00:00
  Filled 2018-12-18: qty 7

## 2018-12-18 NOTE — Progress Notes (Signed)
PROGRESS NOTE  Laurie Horne OIT:[id number] DOB: 1934-08-02 DOA: 12/14/2018 PCP: Elizabeth Palau, FNP  Brief History   The patient is a 85 yr old woman who was discharged from the [facility] 12/11/2018 with a diagnosis of right tibial plateau fracture.  She ws discharged with a prescription for hydrocodone. She also takes Xanax. The patient was admitted to the ICU on 12/14/2018 obtunded due to inadvertent overdose of narcotics.  She was in hypercapneic respiratory failure and was intubated in the ED. The patient was extubated on 12/15/2018 and transferred to the floor. I contacted orthopedic surgery today and confirmed that the patient should be on "toe touch" weight bearing limitations.   The patient has been evaluated by PT/OT. Their recommendation is for SNF. She is awaiting placement.  Consultants  . None  Procedures  . None  Antibiotics  . None  Interval History/Subjective  See above  Objective   Vitals:  Vitals:   12/18/18 0846 12/18/18 1427  BP:  134/66  Pulse:  66  Resp:  20  Temp:  98.5 F (36.9 C)  SpO2: 98% 98%    Exam:  Constitutional:  . The patient is awake, alert, and oriented x 3. No acute distress. She does not remember me from my visits the two previous days. Respiratory:  . No wheezes, rales, or rhonchi are appreciated. . No increased work of breathing. . No tactile fremitus/ Cardiovascular:  . Regular rate and rhythm. . No murmurs, ectopy, or gallups. . No lateral PMI. No thrills. Abdomen:  . Abdomen is soft, non-tender, non-distended . No hernias, masses, or organomegaly. . Normoactive bowel sounds. Musculoskeletal:  . No cyanosis, clubbing or edema Skin:  . No rashes, lesions, ulcers . palpation of skin: no induration or nodules Neurologic:  . CN 2-12 intact . Sensation all 4 extremities intact Psychiatric:  . Mental status o Mood, affect Depressed, flat o Orientation to person, place, time  . judgment and insight cannot be evaluated due to  patient's reluctance to speak.  I have personally reviewed the following:   Today's Data  . Vitals, blood cultures  Scheduled Meds: . amLODipine  5 mg Oral Daily  . aspirin EC  81 mg Oral Daily  . chlorhexidine  15 mL Mouth Rinse BID  . dorzolamide-timolol  1 drop Both Eyes BID  . enoxaparin (LOVENOX) injection  40 mg Subcutaneous Q24H  . feeding supplement (ENSURE ENLIVE)  237 mL Oral TID BM  . latanoprost  1 drop Both Eyes QHS  . lip balm      . mouth rinse  15 mL Mouth Rinse q12n4p  . sertraline  25 mg Oral QHS  . umeclidinium-vilanterol  1 puff Inhalation Daily   Continuous Infusions:  Active Problems:   Respiratory failure with hypercapnia (HCC)   Protein-calorie malnutrition, severe (HCC)   Depression   Essential hypertension   Drug overdoses, accidental or unintentional, sequela Tibial plateau fracture Hypertension Depression  A & P   No problems updated. Hypercapneic respiratory failure: Resolved  Tibial plateau fracture: Toe touch weight bearing per orthopedic surgery. Patient to be evaluated by PT/OT to determine appropriate disposition. Only tylenol for pain. The patient is awaiting SNF placement. SW has been consulted.  Essential Hypertension: continue home doses of Norvasc  Depression: continue Sertraline as at outpatient.  DVT prophylaxis: Lovenox Code Status: DNR Family Communication: None at bedside Disposition Plan: SNF. Awaiting placement.   Raequon Catanzaro, DO [facility] Direct contact: see [email or web address]  7PM-7AM contact night coverage as above  12/16/2018, 4:54 PM  LOS: 2 days    LOS: 4 days

## 2018-12-18 NOTE — Plan of Care (Signed)
  Problem: Nutrition: Goal: Adequate nutrition will be maintained Outcome: Progressing   Problem: Coping: Goal: Level of anxiety will decrease Outcome: Progressing   Problem: Pain Managment: Goal: General experience of comfort will improve Outcome: Progressing   

## 2018-12-18 NOTE — Progress Notes (Signed)
Physical Therapy Treatment Patient Details Name: Laurie Horne MRN: [id number] DOB: 1933/09/30 Today's Date: 12/18/2018    History of Present Illness 85 yo female admitted to ED on 3/22 with AMS from home, suspected due to medication complications. Pt with recent ED admission 3/20 with R tibial plateau fracture, pt sent home in a R knee immobilizer. PMH includes COPD, esophageal stricture with dilation, falls, gait abnormality, HOH, HLD, HTN, memory disorder, suicide attempt, anxiety/depression, ICH 2015.    PT Comments    Pt participates fairly well with multimodal cueing. Mod assist for mobility; +2 for ambulation safety. Some pain with activity. Continuing to educate pt on WB precautions-unfortunately she is unable to retain the information. Continue to recommend SNF.    Follow Up Recommendations  SNF     Equipment Recommendations  (TBD at next venue)    Recommendations for Other Services       Precautions / Restrictions Precautions Precautions: Fall Required Braces or Orthoses: Knee Immobilizer - Right Knee Immobilizer - Right: On at all times Restrictions Weight Bearing Restrictions: Yes RLE Weight Bearing: Touchdown weight bearing    Mobility  Bed Mobility               General bed mobility comments: oob in recliner  Transfers Overall transfer level: Needs assistance Equipment used: Rolling walker (2 wheeled) Transfers: Sit to/from [employer] Sit to Stand: Mod assist Stand pivot transfers: Min assist       General transfer comment: Assist to rise, stabilize, control descent. Multimodal cueing required for safety, technique, hand/LE placement, adherence to TDWB status. Pt utilizing NWB vs TDWB/shuffle on L foot  Ambulation/Gait Ambulation/Gait assistance: Min assist;+2 safety/equipment Gait Distance (Feet): 3 Feet Assistive device: Rolling walker (2 wheeled) Gait Pattern/deviations: Step-to pattern     General Gait Details: Assist to  stabilize pt and maneuver safely with RW. Constant cueing for adherence to TDWB status and proper use of RW to aid with WBing.    Stairs             Wheelchair Mobility    Modified Rankin (Stroke Patients Only)       Balance Overall balance assessment: Needs assistance         Standing balance support: Bilateral upper extremity supported Standing balance-Leahy Scale: Poor                              Cognition Arousal/Alertness: Awake/alert Behavior During Therapy: Flat affect Overall Cognitive Status: No family/caregiver present to determine baseline cognitive functioning Area of Impairment: Memory;Safety/judgement;Problem solving                     Memory: Decreased short-term memory;Decreased recall of precautions   Safety/Judgement: Decreased awareness of deficits;Decreased awareness of safety   Problem Solving: Requires verbal cues;Requires tactile cues        Exercises      General Comments        Pertinent Vitals/Pain Pain Assessment: Faces Faces Pain Scale: Hurts little more Pain Location: RLE with mobility  Pain Descriptors / Indicators: Grimacing;Discomfort Pain Intervention(s): Limited activity within patient's tolerance;Repositioned    Home Living                      Prior Function            PT Goals (current goals can now be found in the care plan section) Progress towards PT goals: Progressing  toward goals    Frequency    Min 3X/week      PT Plan Current plan remains appropriate    Co-evaluation              AM-PAC PT "6 Clicks" Mobility   Outcome Measure  Help needed turning from your back to your side while in a flat bed without using bedrails?: A Little Help needed moving from lying on your back to sitting on the side of a flat bed without using bedrails?: A Little Help needed moving to and from a bed to a chair (including a wheelchair)?: A Little Help needed standing up from a  chair using your arms (e.g., wheelchair or bedside chair)?: A Lot Help needed to walk in hospital room?: A Lot Help needed climbing 3-5 steps with a railing? : Total 6 Click Score: 14    End of Session Equipment Utilized During Treatment: Oxygen;Right knee immobilizer Activity Tolerance: Patient tolerated treatment well Patient left: in chair;with call bell/phone within reach;with chair alarm set   PT Visit Diagnosis: History of falling (Z91.81);Other abnormalities of gait and mobility (R26.89);Muscle weakness (generalized) (M62.81);Pain Pain - Right/Left: Right Pain - part of body: Leg     Time: 2751-7001 PT Time Calculation (min) (ACUTE ONLY): 22 min  Charges:  $Therapeutic Activity: 8-22 mins                       Rebeca Alert, PT Acute Rehabilitation Services Pager: [phone] Office: [phone]

## 2018-12-19 LAB — CULTURE, BLOOD (ROUTINE X 2)
Culture: NO GROWTH
Culture: NO GROWTH
Special Requests: ADEQUATE

## 2018-12-19 MED ORDER — SERTRALINE HCL 25 MG PO TABS: 25.0000 mg | ORAL_TABLET | Freq: Every day | ORAL | 0 refills | Status: AC

## 2018-12-19 MED ORDER — IBUPROFEN 400 MG PO TABS: 400.0000 mg | ORAL_TABLET | ORAL | 0 refills | Status: AC | PRN

## 2018-12-19 MED ORDER — UMECLIDINIUM-VILANTEROL 62.5-25 MCG/INH IN AEPB: 1.0000 | INHALATION_SPRAY | Freq: Every day | RESPIRATORY_TRACT | 0 refills | Status: DC

## 2018-12-19 NOTE — Progress Notes (Signed)
Report called to Joy at [facility]).  Patient's son, Sidonie Dickens notified.  States he will be here in about an hour.

## 2018-12-19 NOTE — Discharge Summary (Signed)
Physician Discharge Summary  Laurie Horne ZOX:[id number] DOB: 25-Aug-1934 DOA: 12/14/2018  PCP: Elizabeth Palau, FNP  Admit date: 12/14/2018 Discharge date: 12/19/2018  Recommendations for Outpatient Follow-up:  1. PT/OT eval and treat at the subacute nursing facility. 2. Follow up with [facility] as Outpatient in one week.  Follow-up Information    [facility] Follow up.   Specialty:  Home Health Services Why:  Home Health Contact information: [address] [place] [address] [phone]          Discharge Diagnoses: Principal diagnosis is #1 1. Acute hypercarbic respiratory failure 2. Unintentional narcotic overdose 3. Right tibial plateau fracture 4. Constipation 5. Essential Hypertension 6. Depression   Discharge Condition: Fair Disposition: SNF  Diet recommendation: Regular  Filed Weights   12/15/18 0428 12/16/18 0457 12/18/18 0527  Weight: 41.7 kg 43.6 kg 43.1 kg    History of present illness:  The patient is a 85 yr old woman who was discharged from the [facility] 12/11/2018 with a diagnosis of right tibial plateau fracture.  She ws discharged with a prescription for hydrocodone. She also takes Xanax. The patient was admitted to the ICU on 12/14/2018 obtunded due to inadvertent overdose of narcotics.   Hospital Course:  She was in hypercapneic respiratory failure and was intubated in the ED. The patient was extubated on 12/15/2018 and transferred to the floor. Orthopedic surgery was contacted and confirmed that the patient should be on "toe touch" weight bearing limitations.   The patient has been evaluated by PT/OT. Their recommendation is for SNF. She is awaiting placement.  Today's assessment:Please see physical exam on the 3/37/3030 progress note for physical exam on the date of discharge.  Discharge Instructions  Discharge Instructions    Diet - low sodium heart healthy   Complete by:  As directed    Increase activity slowly   Complete by:  As directed    Other Restrictions   Complete by:  As directed    Toe touch weight bearing only on the right lower extremity until patient has followed up with [employer].     Allergies as of 12/19/2018      Reactions   Codeine Other (See Comments)   Unknown rxn      Medication List    STOP taking these medications   ALPRAZolam 0.25 MG tablet Commonly known as:  XANAX   HYDROcodone-acetaminophen 5-325 MG tablet Commonly known as:  NORCO/VICODIN   neomycin-bacitracin-polymyxin [phone] ointment     TAKE these medications   amLODipine 5 MG tablet Commonly known as:  NORVASC Take 1 tablet (5 mg total) by mouth daily.   aspirin 81 MG tablet Take 81 mg by mouth daily.   dorzolamide-timolol 22.3-6.8 MG/ML ophthalmic solution Commonly known as:  COSOPT Place 1 drop into both eyes 2 (two) times daily.   feeding supplement (ENSURE COMPLETE) Liqd Take 237 mLs by mouth 2 (two) times daily between meals.   ibuprofen 400 MG tablet Commonly known as:  ADVIL,MOTRIN Take 1 tablet (400 mg total) by mouth every 4 (four) hours as needed for mild pain.   latanoprost 0.005 % ophthalmic solution Commonly known as:  XALATAN Place 1 drop into both eyes at bedtime.   nicotine 14 mg/24hr patch Commonly known as:  NICODERM CQ - dosed in mg/24 hours Place 1 patch (14 mg total) onto the skin daily.   sertraline 25 MG tablet Commonly known as:  ZOLOFT Take 1 tablet (25 mg total)  by mouth at bedtime.   umeclidinium-vilanterol 62.5-25 MCG/INH Aepb Commonly known as:  ANORO ELLIPTA Inhale 1 puff into the lungs daily. Start taking on:  December 20, 2018      Allergies  Allergen Reactions   Codeine Other (See Comments)    Unknown rxn    The results of significant diagnostics from this hospitalization (including imaging, microbiology, ancillary and laboratory) are listed below for reference.    Significant Diagnostic Studies: Dg  Chest 2 View  Result Date: 12/12/2018 CLINICAL DATA:  85 year old female with right lower extremity pain after falling last night. EXAM: CHEST - 2 VIEW COMPARISON:  At home prior chest x-ray 09/08/2014 FINDINGS: Cardiopericardial silhouette remains unchanged. The heart is at the upper limits of normal for size. Atherosclerotic calcifications again visualized in the thoracic aorta. There is a moderately large hiatal hernia. The lungs appear hyperinflated with pulmonary vascular congestion and cephalization but no overt pulmonary edema. No suspicious pulmonary mass or nodule. No pleural effusion or pneumothorax. No acute osseous abnormality. IMPRESSION: 1. Mild cardiomegaly with pulmonary vascular congestion but no overt edema. 2.  Aortic Atherosclerosis (ICD10-170.0) 3. Stable hyperinflation and chronic bronchitic changes. Electronically Signed   By: Malachy Moan M.D.   On: 12/12/2018 12:07   Dg Pelvis 1-2 Views  Result Date: 12/12/2018 CLINICAL DATA:  Fall.  Can not bear weight. EXAM: PELVIS - 1-2 VIEW COMPARISON:  11/21/2013. FINDINGS: Diffuse osteopenia. Severe degenerative changes scoliosis lumbar spine. Degenerative changes both hips. No acute bony abnormality identified. Aortoiliac and iliofemoral atherosclerotic vascular calcification. Pelvic calcifications consistent phleboliths. IMPRESSION: Diffuse osteopenia. Severe degenerative changes lumbar spine. Degenerative changes both hips. No acute bony abnormality. No evidence of fracture. 2.  Aortoiliac and iliofemoral atherosclerotic vascular disease. Electronically Signed   By: Maisie Fus  Register   On: 12/12/2018 12:06   Ct Head Wo Contrast  Result Date: 12/14/2018 CLINICAL DATA:  Altered level of consciousness.  Unresponsive. EXAM: CT HEAD WITHOUT CONTRAST TECHNIQUE: Contiguous axial images were obtained from the base of the skull through the vertex without intravenous contrast. COMPARISON:  None. FINDINGS: Brain: Moderate generalized atrophy and  white matter disease is similar the prior study. No acute infarct, hemorrhage, or mass lesion is present. Basal ganglia are intact. Insular ribbon is normal bilaterally. No focal cortical lesions are present. The ventricles are of proportionate to the degree of atrophy. No significant extraaxial fluid collection is present. The brainstem and cerebellum are within normal limits. Vascular: No hyperdense vessel or unexpected calcification. Skull: Calvarium is intact. No focal lytic or blastic lesions are present. Sinuses/Orbits: The paranasal sinuses and mastoid air cells are clear. Bilateral lens replacements are noted. Globes and orbits are otherwise unremarkable. IMPRESSION: 1. No acute intracranial abnormality. No acute or focal abnormality to explain altered mental status or unresponsiveness. 2. Stable atrophy and white matter disease. This likely reflects the sequela of chronic microvascular ischemia. Electronically Signed   By: Marin Roberts M.D.   On: 12/14/2018 14:27   Dg Chest Port 1 View  Result Date: 12/14/2018 CLINICAL DATA:  Altered mental status for 2 days. EXAM: PORTABLE CHEST 1 VIEW COMPARISON:  PA and lateral chest 12/12/2018. FINDINGS: The lungs are clear. There is cardiomegaly. No pneumothorax or pleural effusion. Aortic atherosclerosis noted. No acute or focal bony abnormality. IMPRESSION: No acute disease. Cardiomegaly. Atherosclerosis. Electronically Signed   By: Drusilla Kanner M.D.   On: 12/14/2018 13:48   Dg Knee Complete 4 Views Right  Result Date: 12/12/2018 CLINICAL DATA:  Right knee pain. Fall last  night. Initial encounter. EXAM: RIGHT KNEE - COMPLETE 4+ VIEW COMPARISON:  None. FINDINGS: There is a fracture of the lateral tibial plateau with slight depression. The fracture extends distally and anteriorly to the level of the tibial tubercle on the lateral radiograph. There is a large hemarthrosis with fat fluid level. There is no dislocation. Chondrocalcinosis is noted in  the medial and lateral compartments. Atherosclerotic vascular calcifications is noted. IMPRESSION: Lateral tibial plateau fracture with large hemarthrosis. Electronically Signed   By: Sebastian Ache M.D.   On: 12/12/2018 12:09    Microbiology: Recent Results (from the past 240 hour(s))  Blood Culture (routine x 2)     Status: None   Collection Time: 12/14/18  2:35 PM  Result Value Ref Range Status   Specimen Description   Final    BLOOD RIGHT WRIST Performed at [facility], [address], [place], [place] [address]    Special Requests   Final    BOTTLES DRAWN AEROBIC AND ANAEROBIC Blood Culture adequate volume Performed at [facility], [address] [place], [place] [address]    Culture   Final    NO GROWTH 5 DAYS Performed at [facility], [address], [place], [place] [address]    Report Status 12/19/2018 FINAL  Final  Blood Culture (routine x 2)     Status: None   Collection Time: 12/14/18  2:50 PM  Result Value Ref Range Status   Specimen Description   Final    BLOOD RIGHT ANTECUBITAL Performed at [facility], [address], [place], [place] [address]    Special Requests   Final    BOTTLES DRAWN AEROBIC AND ANAEROBIC Blood Culture results may not be optimal due to an excessive volume of blood received in culture bottles Performed at [facility], [address] [place], [place] [address]    Culture   Final    NO GROWTH 5 DAYS Performed at [facility], [address], [place], [place] [address]    Report Status 12/19/2018 FINAL  Final  MRSA PCR Screening     Status: None   Collection Time: 12/14/18  8:39 PM  Result Value Ref Range Status   MRSA by PCR NEGATIVE NEGATIVE Final    Comment:        The GeneXpert MRSA Assay (FDA approved for NASAL specimens only), is one component of a comprehensive MRSA colonization surveillance program. It is not intended to diagnose MRSA infection nor  to guide or monitor treatment for MRSA infections. Performed at [facility], [address], [place], [place] [address]      Labs: Basic Metabolic Panel: Recent Labs  Lab 12/14/18 1325 12/14/18 1456 12/14/18 1457 12/15/18 0356 12/18/18 0512  NA 134* 133*  --  132* 133*  K 4.1 4.6  --  4.1 3.8  CL 85*  --   --  86* 87*  CO2 41*  --   --  34* 39*  GLUCOSE 116*  --   --  136* 150*  BUN 13  --   --  21 18  CREATININE 0.37*  --  0.40* 0.46 <0.30*  CALCIUM 9.5  --   --  9.4 8.5*  MG  --   --   --  2.4  --   PHOS  --   --   --  3.1  --    Liver Function Tests: Recent Labs  Lab 12/14/18 1325  AST 18  ALT 12  ALKPHOS 75  BILITOT 0.7  PROT 7.6  ALBUMIN 3.5   No results for input(s): LIPASE, AMYLASE in the last 168 hours. No results for input(s): AMMONIA in the last 168 hours. CBC: Recent Labs  Lab 12/14/18 1325 12/14/18 1456 12/15/18 0356 12/18/18 0512  WBC 16.0*  --  12.2* 11.9*  NEUTROABS 12.5*  --   --  9.4*  HGB 12.8 12.9 12.7 11.2*  HCT 43.6 38.0 42.6 38.4  MCV 97.1  --  94.7 98.5  PLT 214  --  215 228   Cardiac Enzymes: Recent Labs  Lab 12/14/18 1325  TROPONINI <0.03   BNP: BNP (last 3 results) Recent Labs    12/14/18 1326  BNP 177.0*    ProBNP (last 3 results) No results for input(s): PROBNP in the last 8760 hours.  CBG: Recent Labs  Lab 12/14/18 1318  GLUCAP 111*    Active Problems:   Respiratory failure with hypercapnia (HCC)   Protein-calorie malnutrition, severe (HCC)   Depression   Essential hypertension   Drug overdoses, accidental or unintentional, sequela   Time coordinating discharge: 38 minutes  Signed:        Rudra Hobbins, DO [facility]  12/19/2018, 2:15 PM

## 2018-12-19 NOTE — Plan of Care (Signed)
  Problem: Clinical Measurements: Goal: Will remain free from infection Outcome: Progressing Goal: Diagnostic test results will improve Outcome: Progressing   Problem: Elimination: Goal: Will not experience complications related to bowel motility Outcome: Progressing Goal: Will not experience complications related to urinary retention Outcome: Progressing   Problem: Pain Managment: Goal: General experience of comfort will improve Outcome: Progressing   Problem: Safety: Goal: Ability to remain free from injury will improve Outcome: Progressing   Problem: Skin Integrity: Goal: Risk for impaired skin integrity will decrease Outcome: Progressing   Problem: Health Behavior/Discharge Planning: Goal: Ability to manage health-related needs will improve Outcome: Not Progressing   Problem: Activity: Goal: Risk for activity intolerance will decrease Outcome: Not Progressing   Problem: Nutrition: Goal: Adequate nutrition will be maintained Outcome: Not Progressing   Problem: Coping: Goal: Level of anxiety will decrease Outcome: Not Progressing

## 2018-12-19 NOTE — TOC Transition Note (Signed)
Transition of Care [facility]) - CM/SW Discharge Note   Patient Details  Name: Laurie Horne MRN: [id number] Date of Birth: 1934/04/28  [facility] CM/SW Contact:  Coralyn Helling, LCSW Phone Number: 12/19/2018, 2:17 PM   Clinical Narrative:    [facility]   Patients son, Sidonie Dickens will transport patient to facility.   LCSW to fax dc docs when available.   RN report #: [phone]    Final next level of care: Skilled Nursing Facility Barriers to Discharge: Continued Medical Work up   Patient Goals and CMS Choice   CMS Medicare.gov Compare Post Acute Care list provided to:: Patient Represenative (must comment)(Left in patients room. Spoke with son Sidonie Dickens) Choice offered to / list presented to : Patient, Spouse, Adult Children  Discharge Placement   Existing PASRR number confirmed : 12/17/18          Patient chooses bed at: [facility] Patient to be transferred to facility by: Son, private car Name of family member notified: Shaun Patient and family notified of of transfer: 12/19/18  Discharge Plan and Services                          Social Determinants of Health (SDOH) Interventions     Readmission Risk Interventions Readmission Risk Prevention Plan 12/16/2018  Post Dischage Appt Complete  Medication Screening Complete  Transportation Screening Complete  Some recent data might be hidden

## 2018-12-19 NOTE — Progress Notes (Signed)
PROGRESS NOTE  Laurie Horne OVZ:[id number] DOB: [birth year] DOA: 12/14/2018 PCP: Elizabeth Palau, FNP  Brief History   The patient is a 85 yr old woman who was discharged from the [facility] 12/11/2018 with a diagnosis of right tibial plateau fracture.  She ws discharged with a prescription for hydrocodone. She also takes Xanax. The patient was admitted to the ICU on 12/14/2018 obtunded due to inadvertent overdose of narcotics.  She was in hypercapneic respiratory failure and was intubated in the ED. The patient was extubated on 12/15/2018 and transferred to the floor. I contacted orthopedic surgery today and confirmed that the patient should be on "toe touch" weight bearing limitations.   The patient has been evaluated by PT/OT. Their recommendation is for SNF. She is awaiting placement.  Consultants  . None  Procedures  . None  Antibiotics  . None  Interval History/Subjective  Today the patient is complaining of constipation. She is requesting an enema.  Objective   Vitals:  Vitals:   12/19/18 0629 12/19/18 0906  BP: (!) 153/77   Pulse: 70   Resp: 15   Temp: 98.8 F (37.1 C)   SpO2: 96% 96%    Exam:  Constitutional:  . The patient is awake, alert, and oriented x 3. Mild distress from abdominal cramping. She does not remember me from my visits the two previous days. Respiratory:  . No wheezes, rales, or rhonchi are appreciated. . No increased work of breathing. . No tactile fremitus/ Cardiovascular:  . Regular rate and rhythm. . No murmurs, ectopy, or gallups. . No lateral PMI. No thrills. Abdomen:  . Abdomen is soft, non-tender, slightly distended. . No hernias, masses, or organomegaly. . Normoactive bowel sounds. Musculoskeletal:  . No cyanosis, clubbing or edema Skin:  . No rashes, lesions, ulcers . palpation of skin: no induration or nodules Neurologic:  . CN 2-12 intact . Sensation all 4 extremities intact Psychiatric:  . Mental status o Mood, affect  Depressed, flat o Orientation to person, place, time  . judgment and insight cannot be evaluated due to patient's reluctance to speak.  I have personally reviewed the following:   Today's Data  . Vitals, blood cultures  Scheduled Meds: . amLODipine  5 mg Oral Daily  . aspirin EC  81 mg Oral Daily  . chlorhexidine  15 mL Mouth Rinse BID  . dorzolamide-timolol  1 drop Both Eyes BID  . enoxaparin (LOVENOX) injection  40 mg Subcutaneous Q24H  . feeding supplement (ENSURE ENLIVE)  237 mL Oral TID BM  . latanoprost  1 drop Both Eyes QHS  . mouth rinse  15 mL Mouth Rinse q12n4p  . sertraline  25 mg Oral QHS  . umeclidinium-vilanterol  1 puff Inhalation Daily   Continuous Infusions:  Active Problems:   Respiratory failure with hypercapnia (HCC)   Protein-calorie malnutrition, severe (HCC)   Depression   Essential hypertension   Drug overdoses, accidental or unintentional, sequela Tibial plateau fracture Hypertension Depression  A & P   No problems updated. Hypercapneic respiratory failure: Resolved  Tibial plateau fracture: Toe touch weight bearing per orthopedic surgery. Patient to be evaluated by PT/OT to determine appropriate disposition. Only tylenol for pain. The patient is awaiting SNF placement. SW has been consulted.  Constipation: Soap suds enema.  Essential Hypertension: continue home doses of Norvasc  Depression: continue Sertraline as at outpatient.  DVT prophylaxis: Lovenox Code Status: DNR Family Communication: None at bedside Disposition Plan: SNF. Awaiting placement.  Fran Lowes, DO [facility] Direct  contact: see [email or web address]  7PM-7AM contact night coverage as above 12/16/2018, 4:54 PM  LOS: 2 days    LOS: 5 days

## 2018-12-19 NOTE — Progress Notes (Signed)
Soap suds enema given as ordered.  Post 500 ml warm water inserted, a large brown solid stool and brown liquid from rectum.  Tolerated well.  States she feels better.  Pericare provided and linens changed with assist of NT

## 2018-12-19 NOTE — Progress Notes (Signed)
Patients son Sidonie Dickens here to transport patient to SNF.  Provided clothing placed on patient and transported via w/c and O2 to exit.  Assisted to vehicle.  Personal O2 in use at time of departure.

## 2018-12-19 NOTE — Plan of Care (Signed)
  Problem: Education: Goal: Knowledge of General Education information will improve Description Including pain rating scale, medication(s)/side effects and non-pharmacologic comfort measures Outcome: Adequate for Discharge   Problem: Health Behavior/Discharge Planning: Goal: Ability to manage health-related needs will improve 12/19/2018 1449 by Watt Climes, RN Outcome: Adequate for Discharge 12/19/2018 1259 by Watt Climes, RN Outcome: Not Progressing   Problem: Clinical Measurements: Goal: Ability to maintain clinical measurements within normal limits will improve Outcome: Adequate for Discharge Goal: Will remain free from infection 12/19/2018 1449 by Watt Climes, RN Outcome: Adequate for Discharge 12/19/2018 1259 by Watt Climes, RN Outcome: Progressing Goal: Diagnostic test results will improve 12/19/2018 1449 by Watt Climes, RN Outcome: Adequate for Discharge 12/19/2018 1259 by Watt Climes, RN Outcome: Progressing Goal: Respiratory complications will improve Outcome: Adequate for Discharge Goal: Cardiovascular complication will be avoided Outcome: Adequate for Discharge   Problem: Activity: Goal: Risk for activity intolerance will decrease 12/19/2018 1449 by Watt Climes, RN Outcome: Adequate for Discharge 12/19/2018 1259 by Watt Climes, RN Outcome: Not Progressing   Problem: Nutrition: Goal: Adequate nutrition will be maintained 12/19/2018 1449 by Watt Climes, RN Outcome: Adequate for Discharge 12/19/2018 1259 by Watt Climes, RN Outcome: Not Progressing   Problem: Coping: Goal: Level of anxiety will decrease 12/19/2018 1449 by Watt Climes, RN Outcome: Adequate for Discharge 12/19/2018 1259 by Watt Climes, RN Outcome: Not Progressing   Problem: Elimination: Goal: Will not experience complications related to bowel motility 12/19/2018 1449 by Watt Climes, RN Outcome: Adequate for Discharge 12/19/2018 1259 by Watt Climes,  RN Outcome: Progressing Goal: Will not experience complications related to urinary retention 12/19/2018 1449 by Watt Climes, RN Outcome: Adequate for Discharge 12/19/2018 1259 by Watt Climes, RN Outcome: Progressing   Problem: Pain Managment: Goal: General experience of comfort will improve 12/19/2018 1449 by Watt Climes, RN Outcome: Adequate for Discharge 12/19/2018 1259 by Watt Climes, RN Outcome: Progressing   Problem: Safety: Goal: Ability to remain free from injury will improve 12/19/2018 1449 by Watt Climes, RN Outcome: Adequate for Discharge 12/19/2018 1259 by Watt Climes, RN Outcome: Progressing   Problem: Skin Integrity: Goal: Risk for impaired skin integrity will decrease 12/19/2018 1449 by Watt Climes, RN Outcome: Adequate for Discharge 12/19/2018 1259 by Watt Climes, RN Outcome: Progressing

## 2020-01-05 ENCOUNTER — Telehealth: Payer: Self-pay

## 2020-01-05 NOTE — Telephone Encounter (Signed)
Received Palliative care referral. Patient evaluated for hospice but did not admit to Hospice services.

## 2020-01-06 ENCOUNTER — Telehealth: Payer: Self-pay | Admitting: Internal Medicine

## 2020-01-06 NOTE — Telephone Encounter (Signed)
Phone call placed to patient to offer to schedule a visit with [facility]. Phone rang, with no answer I left a voicemail for call back. 

## 2020-01-14 ENCOUNTER — Telehealth: Payer: Self-pay

## 2020-01-14 NOTE — Telephone Encounter (Signed)
Received message that patient's son, Ines Bloomer, called and would like to defer Palliative care referral until patient receives second step of Covid vaccine. Vaccine is scheduled for weekend of 01/16/20.

## 2020-01-18 ENCOUNTER — Telehealth: Payer: Self-pay

## 2020-01-18 NOTE — Telephone Encounter (Signed)
Received a message to call patient's son, Ines Bloomer, regarding patient continues to test positive for Covid and is not doing well. Phone call placed to [facility]. VM left

## 2020-01-18 NOTE — Telephone Encounter (Signed)
VM left on patient's home phone.

## 2020-01-25 ENCOUNTER — Telehealth: Payer: Self-pay

## 2020-01-25 NOTE — Telephone Encounter (Signed)
Phone call placed to follow up regarding Palliative care referral. Phone rang with no answer, no VM

## 2020-02-03 ENCOUNTER — Telehealth: Payer: Self-pay | Admitting: Internal Medicine

## 2020-02-03 NOTE — Telephone Encounter (Signed)
Called patient's son Ines Bloomer, to offer to schedule the Palliative Consult and he suggested that I call his brother Beatrice Lecher (who is now living with patient) to schedule.  Called patient's other son Beatrice Lecher, with no answer - left message with reason for call along with my name and contact number.

## 2020-02-03 NOTE — Telephone Encounter (Signed)
Rec'd call back from [facility] and after discussing Palliative services and all questions were answered he was in agreement with this.  I have scheduled an In-person Consult for 02/12/20 @ 12:30 PM

## 2020-02-12 ENCOUNTER — Other Ambulatory Visit: Payer: Self-pay

## 2020-02-12 ENCOUNTER — Encounter: Payer: Self-pay | Admitting: Internal Medicine

## 2020-02-12 ENCOUNTER — Other Ambulatory Visit: Payer: Medicare Other | Admitting: Internal Medicine

## 2020-02-12 DIAGNOSIS — Z7189 Other specified counseling: Secondary | ICD-10-CM

## 2020-02-12 DIAGNOSIS — Z515 Encounter for palliative care: Secondary | ICD-10-CM

## 2020-02-12 NOTE — Progress Notes (Addendum)
May 21st, 2021 [facility] Palliative Care Consult Note Telephone: [phone]  Fax: [phone]  PATIENT NAME: Laurie Horne DOB: Jan 09, 1934 MRN: [id number] [address] [id number] Phone: [phone]  PRIMARY CARE PROVIDER:   Vicenta Aly, NP/Dr. Babette Horne PROVIDER:  Vicenta Horne, [place],  Laurie Horne [address]  RESPONSIBLE PARTY:  Laurie Horne (son recently came to live with patient) [phone]. Laurie Horne (son/HCPOA) [phone]   ASSESSMENT / RECOMMENDATIONS:  1. Advance Care Planning: A. Directives: DNR in home; I uploaded this into Laurie Horne EMR. B. Goals of Care: To enjoy her family. Would like to sit out on the front porch.  2. Cognitive / Functional status: Patient if very hard of hearing. She is alert and pleasant but marked short term memory loss. She did remember that her husband had died about 1 month ago. She misses him and is debating if she should go to his upcoming memorial service or not. She has disinhibition; breaks out into song / uses bedside commode which is beside her electric bed in the living room without concern for who is there. She is engaging and can follow complex commands. Conversant and able to stay on topic. She mentions she lacks motivation; feels apathetic, yes seems restless and is quick to hop up from the bed. She can show poor judgement / child-like behavior / impulsivity.   Patient can self-transfer from bed to bedside commode. She had a fall in the bathroom about 3 months ago. Since that time she has been content to stay close to her bed / bed side commode. She has just a short length of 6 feet or so on her oxygen tubing. She is compliant with wearing her oxygen (continuous flow 2.5 lpm).   Patient appears anorexic. Last weight 85lbs about a month ago. At a height: '5\' 6"'$  her   BMI is 13.7 Her appetite seems to have picked up with initiation of Remeron 7.'5mg'$   qhs, and this has helped her sleep as well. Family trialed a higher dose of '15mg'$  but patient with hypersomnolence the next day. She is supplementing her diet with 2-3 BOOST / day. Son asks if that's too much. I told him he could increase to 4/d.   D-I-L asked me to check out a small hemorrhoid. Site non-inflamed/no-swelling/non-painful. No treatment needed. Daughter also request I take a look at her toes. Toenails with fungal infection. I mentioned to daughter only real treatment would be with antibiotic for which she would need blood work to ensure no liver insult. Family decided against treatment. I did recommend patient see a podiatrist to trim those nails.   Skin intact but she has a  dollar sized reddened area on sacrum (stage 1 pressure injury). Discussed with patient, son and D-I-L that to prevent exacerbation in this area, patient needed good nutrition, to get up out of bed as much as possible, and to turn off side to side while in bed. Her skin is pretty dry and flaky. Recommend liberal use of Nivea cream, which is available in the home.  Patient with generalized weakness. She remembered and demonstrated sitting exercises she used to do with past physical therapy. I reviewed low impact standing exercises and e-mailed a copy of these to her son. I encouraged her to do these on a daily basis (10 x each of 6 exercises) and work up to tid if able. We discussed PT/OT referral to get her ambulating about the home.  She needs a walker and training how to utilize correctly. D-I-L found some oxygen extension tubing and I connected this together so patient could ambulate about living room. She could push the wheelchair for stability, with 1 person assist and guidance, till PT eval / walker.  -Patient's primary caregiver (other son) was not in the home at the time of my evaluation. Patient's son from [place], [place], wished to defer consideration of PT/OT referral to that son. I left my contact information with  request for him to call me.  -Not sure if patient has had a TSH drawn in the recent past. Her CBC and C-Met from earlier this year were pretty much within normal range.   3. Family Supports: Spouse (married 52 yrs) recently deceased (about 1 month ago COVID related pneumonia). Three sons. Son Laurie Horne lives on same street; does the grocery shopping/other tasks. Son Laurie Horne (lives in [place], [place]) visits weekly.Iis PCP/HCPOA. Son Laurie Horne (in the [employer]) has traveled up from [place] with spouse, young son, and their little elderly dog and are currently staying in patient's home. They are considering staying In [place] long term. Laurie Horne provides aide services 24/7.  4. Follow up Palliative Care Visit: Will schedule with Laurie Horne when he calls.   I spent 60 minutes providing this consultation from 12:30pm to 1:30pm. More than 50% of the time in this consultation was spent coordinating communication.   HISTORY OF PRESENT ILLNESS:  Laurie Horne is an 86 y.o. female with h/o COPD (oxygen dependent), dementia, esophageal stricture, HTN, HLD. Palliative Care was asked to help address goals of care.   CODE STATUS: DNR  PPS: 40%  HOSPICE ELIGIBILITY/DIAGNOSIS: TBD  PAST MEDICAL HISTORY:  Past Medical History:  Diagnosis Date  . COPD (chronic obstructive pulmonary disease) ([place])   . Esophageal stricture    s/p dilation  . Falls   . Gait disorder 10/26/2014  . Glaucoma   . History of tobacco abuse   . HOH (hard of hearing)   . Hyperlipidemia   . Hypertension   . Memory disorder 10/26/2014    SOCIAL HX:  Social History   Tobacco Use  . Smoking status: Former Smoker    Packs/day: 0.25    Types: Cigarettes  . Smokeless tobacco: Never Used  Substance Use Topics  . Alcohol use: No    ALLERGIES:  Allergies  Allergen Reactions  . Codeine Other (See Comments)    Unknown rxn     PERTINENT MEDICATIONS:  Outpatient Encounter Medications as of 02/12/2020  Medication Sig  . amLODipine (NORVASC) 5 MG tablet  Take 1 tablet (5 mg total) by mouth daily.  Marland Kitchen aspirin 81 MG tablet Take 81 mg by mouth daily.  . dorzolamide-timolol (COSOPT) 22.3-6.8 MG/ML ophthalmic solution Place 1 drop into both eyes 2 (two) times daily.  . feeding supplement, ENSURE COMPLETE, (ENSURE COMPLETE) LIQD Take 237 mLs by mouth 2 (two) times daily between meals.  . latanoprost (XALATAN) 0.005 % ophthalmic solution Place 1 drop into both eyes at bedtime.   . mirtazapine (REMERON) 15 MG tablet Take 7.5 mg by mouth at bedtime. 1/2 tab (7.'5mg'$  ) qhs  . sertraline (ZOLOFT) 25 MG tablet Take 1 tablet (25 mg total) by mouth at bedtime.  Marland Kitchen ibuprofen (ADVIL,MOTRIN) 400 MG tablet Take 1 tablet (400 mg total) by mouth every 4 (four) hours as needed for mild pain. (Patient not taking: Reported on 02/12/2020)  . [DISCONTINUED] nicotine (NICODERM CQ - DOSED IN MG/24 HOURS) 14 mg/24hr patch Place 1 patch (  14 mg total) onto the skin daily. (Patient not taking: Reported on 12/12/2018)  . [DISCONTINUED] umeclidinium-vilanterol (ANORO ELLIPTA) 62.5-25 MCG/INH AEPB Inhale 1 puff into the lungs daily.   No facility-administered encounter medications on file as of 02/12/2020.    Julianne Handler, NP

## 2020-02-29 ENCOUNTER — Telehealth: Payer: Self-pay | Admitting: Internal Medicine

## 2020-02-29 NOTE — Telephone Encounter (Signed)
  02/29/2020 5pm TC from son Shawn [phone]), asking how to obtain a referral for home Physical Therapy. I directed him to ask at the office of patient's PCP Elizabeth Palau. I mentioned to him to mention that his preference would be for [facility], as patient has used PT services from that agency in the past and they were happy with.  Holly Bodily NP-C  [phone]

## 2020-03-22 ENCOUNTER — Other Ambulatory Visit: Payer: Medicare Other | Admitting: Internal Medicine

## 2020-03-22 ENCOUNTER — Other Ambulatory Visit: Payer: Self-pay

## 2020-03-22 ENCOUNTER — Encounter: Payer: Self-pay | Admitting: Internal Medicine

## 2020-03-22 DIAGNOSIS — Z7189 Other specified counseling: Secondary | ICD-10-CM

## 2020-03-22 DIAGNOSIS — Z515 Encounter for palliative care: Secondary | ICD-10-CM

## 2020-03-23 NOTE — Progress Notes (Signed)
June 29th, 2021 [facility] Palliative Care Consult Note Telephone: [phone]  Fax: [phone]   PATIENT NAME: Laurie Horne DOB: 10/15/1933 MRN: [id number] [address] [phone] Phone: [phone]   PRIMARY CARE PROVIDER:   Elizabeth Palau, NP/Dr. Josem Kaufmann PROVIDER:  Elizabeth Palau, FNP [address] [place],  [place] [address]   RESPONSIBLE PARTY:  Giuseppina Quinones (son recently came to live with patient) [phone]. Shawn Peggs (son/HCPOA) [phone]   ASSESSMENT / RECOMMENDATIONS:  1. Advance Care Planning: A. Directives: DNR in home and present in [facility] EMR. B. Goals of Care: To enjoy her family, increase her activity level, and sit out on front porch in good weather.   2. Cognitive / Functional status: Patient is very hard of hearing. She is oriented to self only. She is alert and pleasantly engaging but marked short term memory loss. She asks repetitious questions and forgets the name of her steady caregiver. She needs frequent cuing and redirection. She has an anxious affect which son Ines Bloomer reports is life-long personality trait. She can exhibit some paranoid behavior. She is on low dose Zoloft. I discussed with Shawn there is room to up titrate, but he really doesn't want to add any more medications to her profile.    Patient can self-transfer from bed to bedside commode. She ambulates with an unsteady gait with a 4 prong cane and stand by assist. She has been working with home PT. She has an exercise bike she uses about twice a week.   -I reviewed some low impact LE exercises with caregiver today; target 10 of each exercise up to tid.  -Anticipate eventual need for walker.  She wears her oxygen [place] at continuous flow 2.5 lpm. No dyspnea.  Occasional insomnia. Will trial giving the Zoloft in the am rather than at hs.  Patient looks like she's gained a little weight over this last 5 weeks. No current  weight recorded. Shawn plans to pick up / locate a scale so we can see how much ground we are gaining. Last measured weight mid-April was 85lbs. She's 5\' 6" , which gave her a BMI of 13.7kg/m2 at that time. She's on Remeron 7.5mg  qhs. Higher doses caused hypersomnolence the next day. She is supplementing her diet with 2-3 [employer] a day. She tends to chew and then spit out meat products.   Chronic urinary urgency. Caregivers limit oral liquids after 9pm to minimize nocturia.  Moving bowels well and regularly.  Patient just completed a course of antibiotics for a R ingrown toenail infection. Shawn plans to contact [facility] to schedule a home podiatry visit to clip/evaluate her nails. I left a message with [facility] on their website to contact Shawn.   Patient has some stage one pressure injuries mid spine and sacral area managed with padded dressings (Allevyn pad), barrier cream, and relieving pressure on those areas as much as possible. She could benefit form a gel pad mattress overlay.  3. Family Supports: Spouse (married 61 yrs) recently deceased (about 2 months ago COVID related pneumonia). Three sons. Son [facility] lives on same street; does the grocery shopping/other tasks. Son Riki Rusk (lives in [place], [place]) visits weekly.Iis PCP/HCPOA. Son [place] (in the Beatrice Lecher) currently in [place]. Kastja provides aide services 24/7 in 12 hour shifts..   4. Follow up Palliative Care Visit: Will schedule with Shawn when he calls.    I spent 60 minutes providing this consultation from 12:30pm to  1:30pm. More than 50% of the time in this consultation was spent coordinating communication.    HISTORY OF PRESENT ILLNESS:  Laurie Horne is an 86 y.o. female with h/o COPD (oxygen dependent), dementia, esophageal stricture, HTN, HLD. Palliative Care was asked to help address goals of care.    CODE STATUS: DNR   PPS: 40%   HOSPICE ELIGIBILITY/DIAGNOSIS: TBD  PAST MEDICAL HISTORY:  Past Medical  History:  Diagnosis Date  . COPD (chronic obstructive pulmonary disease) ([facility])   . Esophageal stricture    s/p dilation  . Falls   . Gait disorder 10/26/2014  . Glaucoma   . History of tobacco abuse   . HOH (hard of hearing)   . Hyperlipidemia   . Hypertension   . Memory disorder 10/26/2014    SOCIAL HX:  Social History   Tobacco Use  . Smoking status: Former Smoker    Packs/day: 0.25    Types: Cigarettes  . Smokeless tobacco: Never Used  Substance Use Topics  . Alcohol use: No    ALLERGIES:  Allergies  Allergen Reactions  . Codeine Other (See Comments)    Unknown rxn     PERTINENT MEDICATIONS:  Outpatient Encounter Medications as of 03/22/2020  Medication Sig  . amLODipine (NORVASC) 5 MG tablet Take 1 tablet (5 mg total) by mouth daily.  Marland Kitchen aspirin 81 MG tablet Take 81 mg by mouth daily.  . dorzolamide-timolol (COSOPT) 22.3-6.8 MG/ML ophthalmic solution Place 1 drop into both eyes 2 (two) times daily.  . feeding supplement, ENSURE COMPLETE, (ENSURE COMPLETE) LIQD Take 237 mLs by mouth 2 (two) times daily between meals. (Patient taking differently: Take 237 mLs by mouth 2 (two) times daily between meals. Ready Care nutritional supplement 200 cal plus protein)  . latanoprost (XALATAN) 0.005 % ophthalmic solution Place 1 drop into both eyes at bedtime.   . mirtazapine (REMERON) 15 MG tablet Take 7.5 mg by mouth at bedtime. 1/2 tab (7.5mg  ) qhs  . sertraline (ZOLOFT) 25 MG tablet Take 1 tablet (25 mg total) by mouth at bedtime.  Marland Kitchen ibuprofen (ADVIL,MOTRIN) 400 MG tablet Take 1 tablet (400 mg total) by mouth every 4 (four) hours as needed for mild pain. (Patient not taking: Reported on 02/12/2020)   No facility-administered encounter medications on file as of 03/22/2020.    General Appearance:   Sleeping propped up in bed but awoke easily to my voice. Very slender but more filled out over the last 5 weeks.  Alert and pleasantly conversant. Somewhat anxious affect. Caregiver  present.  She finished a light lunch while I was there.  Anselm Lis, NP

## 2020-04-18 ENCOUNTER — Telehealth: Payer: Self-pay | Admitting: Internal Medicine

## 2020-04-19 NOTE — Telephone Encounter (Signed)
Spoke with patient's son Beatrice Lecher to see if it was okay to change the time of the Palliative f/u visit on 8/9 to 12:30 PM instead of 1 (so that we could work another patient in) and he was agreeable to this.  Also called son, Ines Bloomer and left a message asking him if this would okay since and rec'd a message from On-call RN that he had returned my call and said this would be fine.

## 2020-05-02 ENCOUNTER — Other Ambulatory Visit: Payer: Medicare Other | Admitting: Internal Medicine

## 2020-05-02 ENCOUNTER — Other Ambulatory Visit: Payer: Self-pay

## 2020-05-02 DIAGNOSIS — Z515 Encounter for palliative care: Secondary | ICD-10-CM

## 2020-05-02 DIAGNOSIS — Z7189 Other specified counseling: Secondary | ICD-10-CM

## 2020-05-03 ENCOUNTER — Encounter: Payer: Self-pay | Admitting: Internal Medicine

## 2020-05-03 NOTE — Progress Notes (Addendum)
Aug 10th, 2021 [facility] Palliative Care Consult Note Telephone: [phone]  Fax: [phone]   05/03/2020 RTC to son Shawn with visit updates. F/U home visit scheduled for 06/28/2020  1pm.  PATIENT NAME: Laurie Horne DOB: May 15, 1934 MRN: [id number] [address] [phone] Phone: [phone]   PRIMARY CARE PROVIDER:   Elizabeth Palau, NP/Dr. Josem Kaufmann PROVIDER:  Elizabeth Palau, FNP [address] [place],  [place] [address]   RESPONSIBLE PARTY:  Jenette Rayson (son recently came to live with patient) [phone]. Shawn Crispen (son/HCPOA) [phone]   ASSESSMENT / RECOMMENDATIONS:  1. Advance Care Planning: A. Directives: DNR in home and present in [facility] EMR. B. Goals of Care: To enjoy her family, increase her activity level, and sit out on front porch in good weather.   2. Cognitive / Functional status (reviewed and updated): Patient continues very hard of hearing. She is oriented to self only. She is alert and pleasantly engaging but marked short term memory loss. She asks repetitious questions and forgets the name of her steady caregiver, if she ate a recent meal, or what it was. She needs frequent cuing and redirection. She has an anxious affect which her son Ines Bloomer reports is life-long personality trait. She can exhibit some paranoid behavior. She is on low dose Zoloft.   Patient can self-transfer from bed to bedside commode with minimal cuing. She ambulates with an unsteady gait and stand by assist. She is unable to cognate use of a walker. She has completed her home PT. They left her exercises which her caregivers have her do about twice daily. She has an exercise bike she uses about twice a week. She needs cuing and assist to dress. Is mostly an assist to bathe (chair bath), though she can brush her own teeth.             She wears her oxygen [place] at continuous flow 2.5 lpm. No dyspnea.   Occasional insomnia.  Sleeping  better since Zoloft schedule changed to qam rather than qhs.    Patient has gained 17lbs over the last 4 months to a current weight of 102lbs. She is on an appetite stimulant (Remeron) and nutritional supplements. She has good nutrition and care from her excellent aides. At a height of 5\' 6"  her BMI is 16.5kg/m2.   Chronic urinary urgency. Caregivers limit oral liquids after 9pm to minimize nocturia.  Moving bowels well and regularly.  Some upper airway congestion last week, now resolved.  LUE skin tear incurred about 3 weeks ago, is healing nicely. It is about 1inch long. Her bottom is no longer reddened; has a new mattress, is up and about more, and has been gaining weight.     She seems in improved spirits; has a tendency to act annoyed but quick it crack a smile. I reviewed some activities with her caregiver to increase her engagement. She was a , so could tap into that past hobby. Also some matching games, puzzles, etc.    3. Family Supports: Spouse (married 61 yrs) recently deceased (about 6 months ago COVID related pneumonia). Three sons. Son Education administrator lives on same street; does the grocery shopping/other tasks. Son Riki Rusk (lives in [place], [place]) visits weekly. He is  PCG/HCPOA/contact person and is very involved/proactive in patient's care. Son [place] (in the [place]). [employer] provides aide services 24/7 in 12 hour shifts..    4. Follow up Palliative Care Visit: Will schedule with Shawn when he  calls.    I spent 60 minutes providing this consultation from 12:30pm to 1:30pm. More than 50% of the time in this consultation was spent coordinating communication.    HISTORY OF PRESENT ILLNESS:  Laurie Horne is an 86 y.o. female with h/o COPD (oxygen dependent), dementia, esophageal stricture, HTN, HLD.   This is a f/u Palliative Care visit from June 29th.   CODE STATUS: DNR   PPS: 40%   HOSPICE ELIGIBILITY/DIAGNOSIS: TBD  PAST MEDICAL HISTORY:  Past Medical History:    Diagnosis Date  . COPD (chronic obstructive pulmonary disease) (HCC)   . Esophageal stricture    s/p dilation  . Falls   . Gait disorder 10/26/2014  . Glaucoma   . History of tobacco abuse   . HOH (hard of hearing)   . Hyperlipidemia   . Hypertension   . Memory disorder 10/26/2014    SOCIAL HX:  Social History   Tobacco Use  . Smoking status: Former Smoker    Packs/day: 0.25    Types: Cigarettes  . Smokeless tobacco: Never Used  Substance Use Topics  . Alcohol use: No    ALLERGIES:  Allergies  Allergen Reactions  . Codeine Other (See Comments)    Unknown rxn     PERTINENT MEDICATIONS:  Outpatient Encounter Medications as of 05/02/2020  Medication Sig  . amLODipine (NORVASC) 5 MG tablet Take 1 tablet (5 mg total) by mouth daily.  Marland Kitchen aspirin 81 MG tablet Take 81 mg by mouth daily.  . dorzolamide-timolol (COSOPT) 22.3-6.8 MG/ML ophthalmic solution Place 1 drop into both eyes 2 (two) times daily.  . feeding supplement, ENSURE COMPLETE, (ENSURE COMPLETE) LIQD Take 237 mLs by mouth 2 (two) times daily between meals. (Patient taking differently: Take 237 mLs by mouth 2 (two) times daily between meals. Ready Care nutritional supplement 200 cal plus protein)  . ibuprofen (ADVIL,MOTRIN) 400 MG tablet Take 1 tablet (400 mg total) by mouth every 4 (four) hours as needed for mild pain.  Marland Kitchen latanoprost (XALATAN) 0.005 % ophthalmic solution Place 1 drop into both eyes at bedtime.   . mirtazapine (REMERON) 15 MG tablet Take 7.5 mg by mouth at bedtime. 1/2 tab (7.5mg  ) qhs  . sertraline (ZOLOFT) 25 MG tablet Take 1 tablet (25 mg total) by mouth at bedtime. (Patient taking differently: Take 25 mg by mouth daily. )   No facility-administered encounter medications on file as of 05/02/2020.    General Appearance:  BP 150/60, HR 57, Sat 99% 2.5LPM   Sleeping propped up in bed but awoke easily to my voice. Very slender but more filled out over the last 5 weeks. She ambulated across the room with  unsteady gait/stand by assist. Able to open the front screen porch door and comment on the very hot weather. Very poor short term memory and very HOH.  Alert and pleasantly conversant. Somewhat anxious affect. Caregiver present.   Anselm Lis, NP [phone]

## 2020-08-21 IMAGING — CR RIGHT KNEE - COMPLETE 4+ VIEW
4 series · 4 of 4 positions shown · non-contrast
Comparison: None.

CLINICAL DATA: Right knee pain. Fall last night. Initial encounter.

EXAM:
RIGHT KNEE - COMPLETE 4+ VIEW

[x knee obl right (1 of 2)]
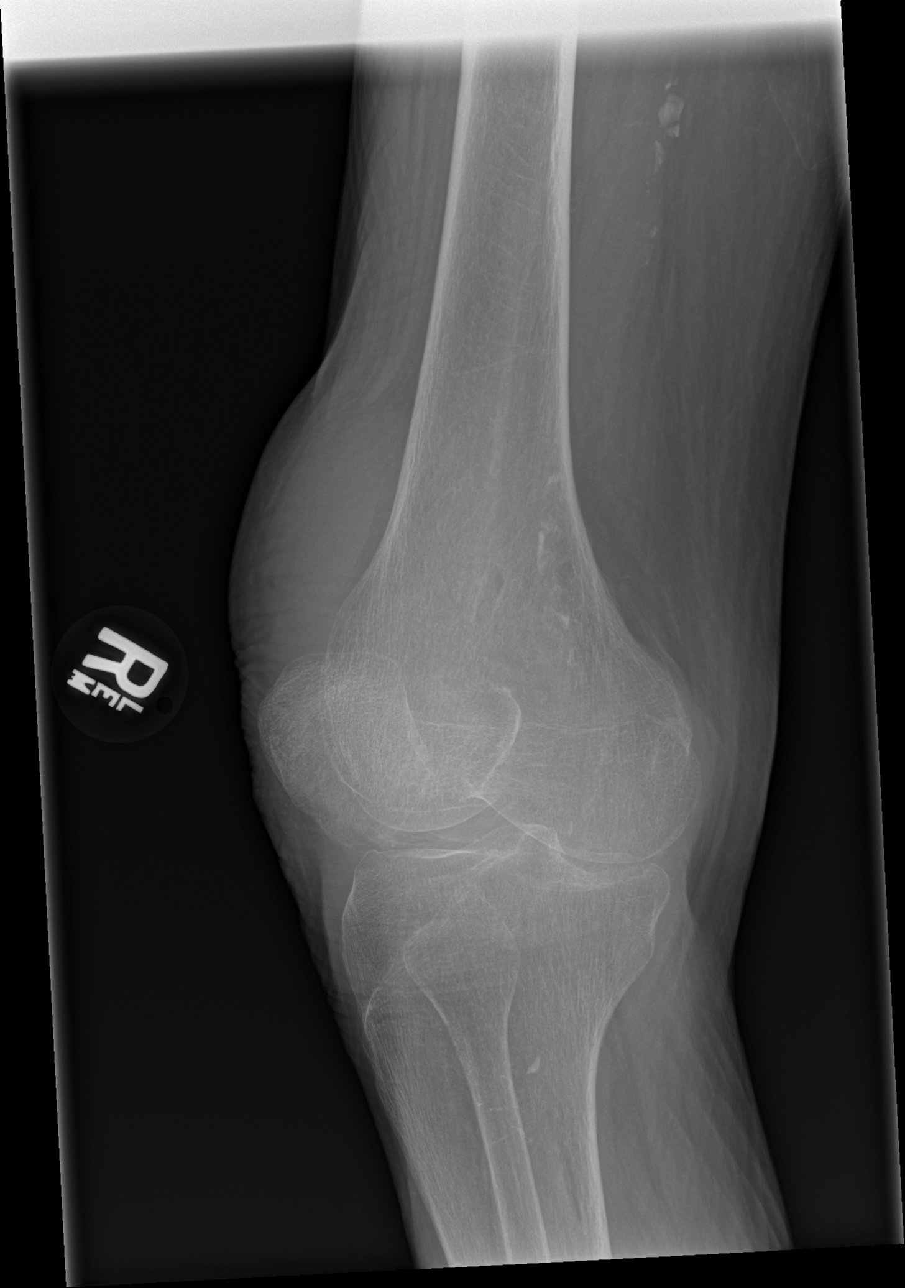

[x knee ap right]
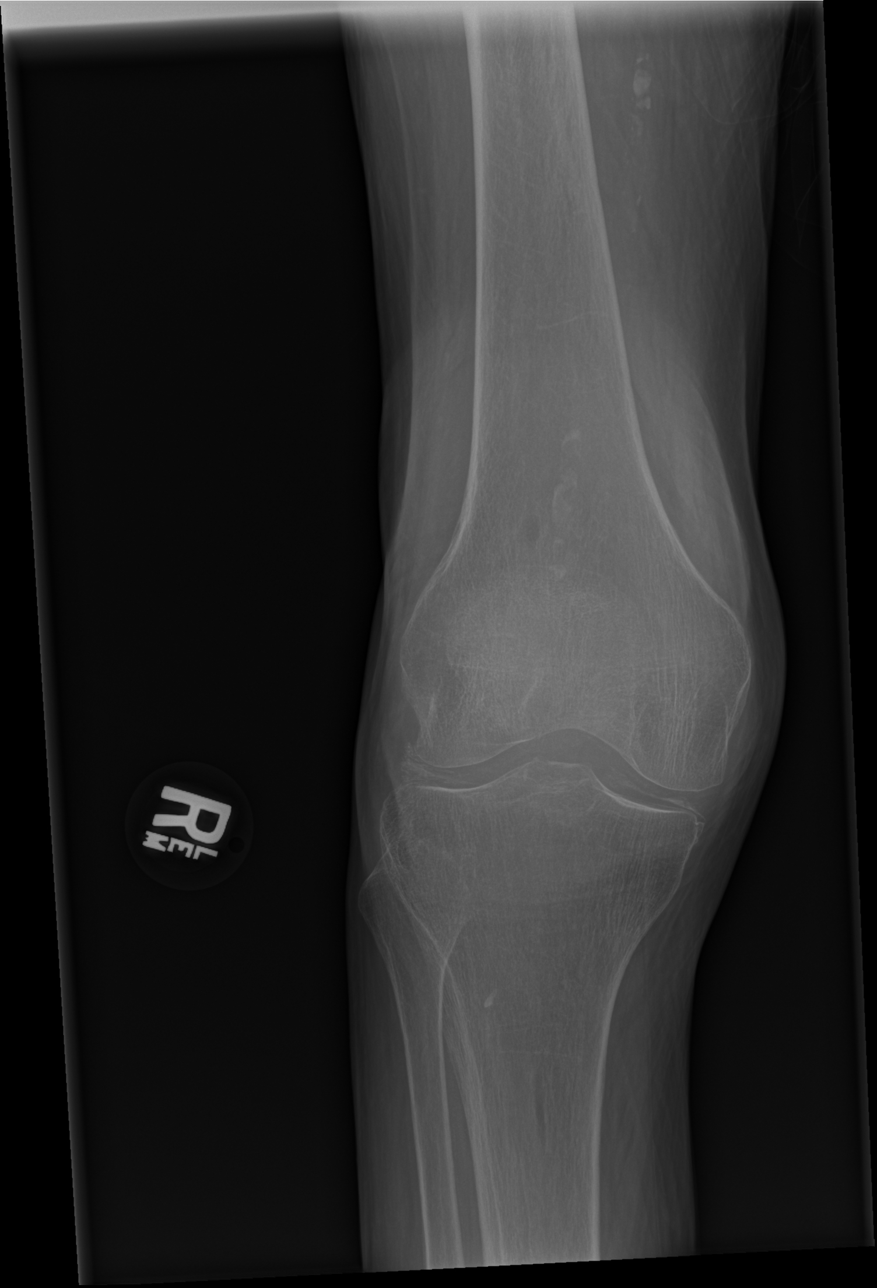

[x knee obl right (2 of 2)]
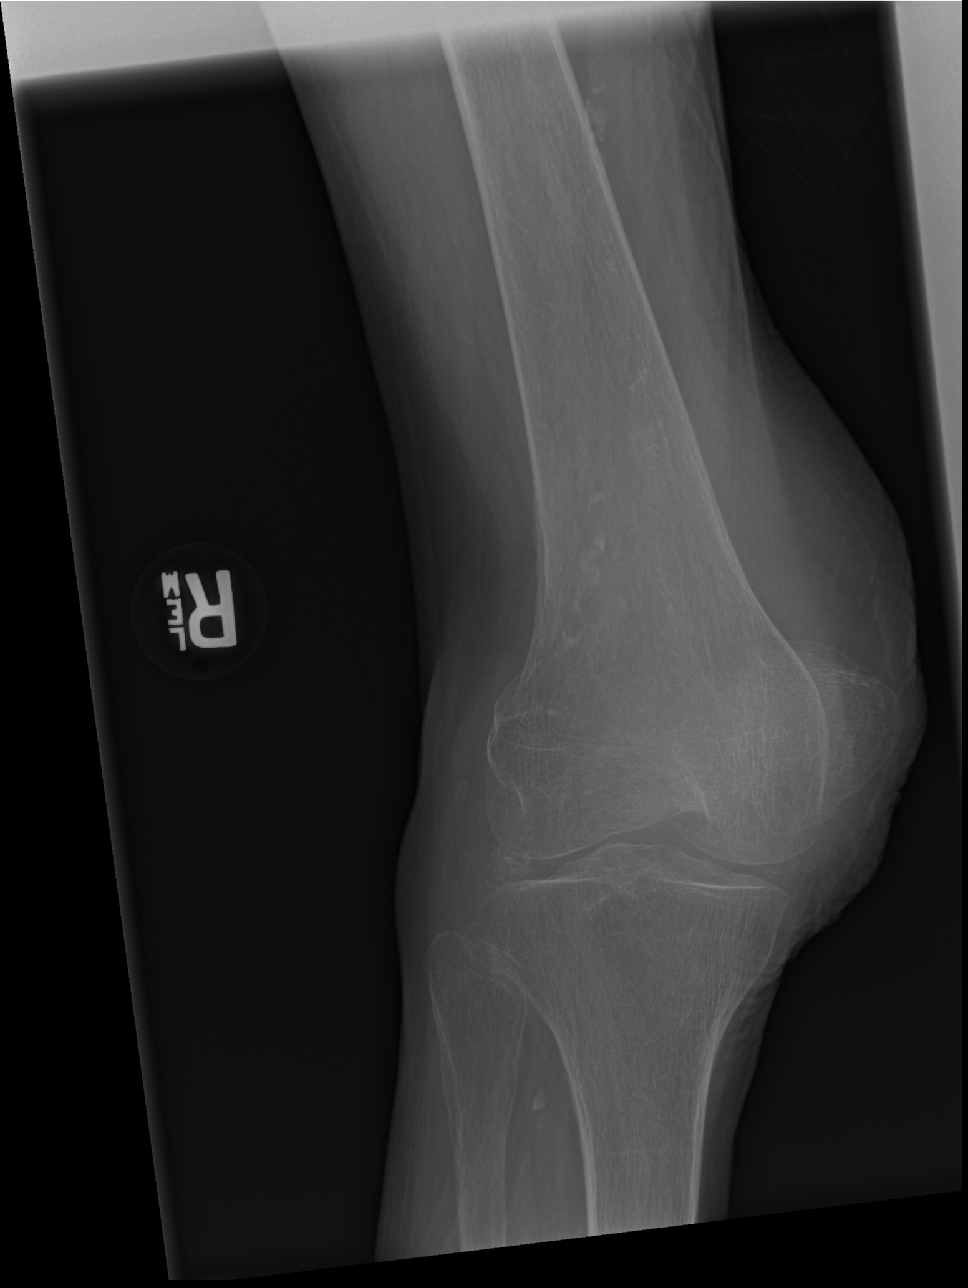

[x knee lat right]
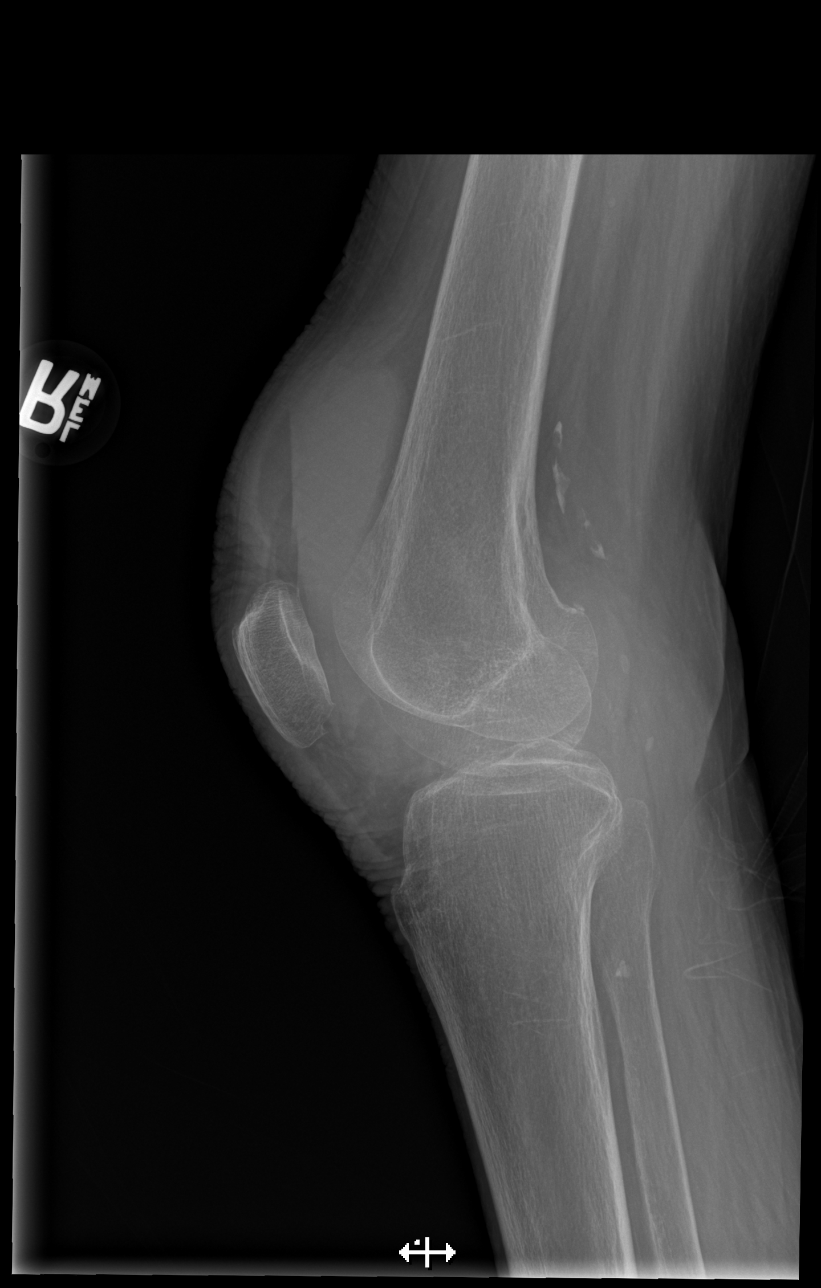

[4 of 4 positions shown; findings below may reference images not displayed]

FINDINGS: There is a fracture of the lateral tibial plateau with slight
depression. The fracture extends distally and anteriorly to the
level of the tibial tubercle on the lateral radiograph. There is a
large hemarthrosis with fat fluid level. There is no dislocation.
Chondrocalcinosis is noted in the medial and lateral compartments.
Atherosclerotic vascular calcifications is noted.
IMPRESSION: Lateral tibial plateau fracture with large hemarthrosis.

## 2020-12-14 ENCOUNTER — Other Ambulatory Visit: Payer: Medicare Other | Admitting: Nurse Practitioner

## 2020-12-14 ENCOUNTER — Other Ambulatory Visit: Payer: Self-pay

## 2021-01-02 ENCOUNTER — Telehealth: Payer: Self-pay

## 2021-01-02 NOTE — Telephone Encounter (Signed)
Son called and he is interested in possibly getting a wheelchair for his mom (patient).  They have a MD appointment coming up and will address with MD at that time, no other questions.

## 2021-03-30 ENCOUNTER — Telehealth: Payer: Self-pay

## 2021-03-30 NOTE — Telephone Encounter (Signed)
Telephone Call/Scheduled Initial Visit SW completed call with patient/PCG to schedule an initial palliative care visit. Visit scheduled for 04/04/21 @10  am.

## 2021-04-04 ENCOUNTER — Other Ambulatory Visit: Payer: Medicare Other

## 2021-04-04 ENCOUNTER — Other Ambulatory Visit: Payer: Medicare Other | Admitting: *Deleted

## 2021-04-04 ENCOUNTER — Other Ambulatory Visit: Payer: Self-pay

## 2021-04-04 VITALS — BP 125/64 | HR 60 | Temp 97.6°F | Resp 18

## 2021-04-04 DIAGNOSIS — Z515 Encounter for palliative care: Secondary | ICD-10-CM

## 2021-04-04 NOTE — Progress Notes (Signed)
[facility] RN NOTE  PATIENT NAME: Laurie Horne DOB: 31-Mar-1934 MRN: [id number]  PRIMARY CARE PROVIDER: Vicenta Aly, FNP  RESPONSIBLE PARTY: Laurie Horne (granddaughter) Acct ID - [place] Phone Work Phone Relationship Acct Type  [id number] Laurie Horne, [phone]  Self P/F     [place], [place], [place] [address]   Covid-19 Pre-screening Negative  PLAN OF CARE and INTERVENTION:  ADVANCE CARE PLANNING/GOALS OF CARE: Goal is for patient to remain in her home. Granddaughter and grandson are her caretakers. She has a DNR PATIENT/CAREGIVER EDUCATION: Explained palliative care services, symptom management, safe mobility, s/s of infection DISEASE STATUS: Joint Initial palliative care face-to-face visit completed with palliative care MSW, [facility]. Met with patient and her granddaughter Laurie Horne. Patient was recently discharged from hospice services for a greater than 6 month prognosis so was referred to palliative care. Upon arrival, patient is sitting up at the dining table finishing up her nebulizer treatment. She uses her nebulizer 3 times daily most of the time. Laurie Horne states that she notices if she doesn't do them regularly she experiences increased confusion and her gait is more unsteady. She is also on continuous oxygen at 2L/min via [place]. She has a Copy that she uses sporadically. She is hard of hearing but able to answer questions and engage in conversation. She is forgetful and will repeat questions. Her mood is pleasant and engaging. She denies pain during visit, but does report mild pain at times in her 5th toes and fingers. She denies shortness of breath and none noted during conversation. She is ambulatory using her cane. No recent falls. She requires assistance with all ADLs except feeding partly due to her poor vision. She is continent of both bowel and bladder. Denies any issues with bowel movements or urination. Her intake is at  baseline which are small meals and Boost for nutritional supplementation. She denies any difficulty swallowing. She currently has a hospital bed and bedside commode. Laurie Horne feels patient would benefit from a transport wheelchair, as she enjoys going outside but is unable to walk on the pavement outside. We will reach out to a community resource to inquire about wheelchair. No questions or concerns at this time. Patient is agreeable to receiving palliative care services. Consent signed. Palliative folder left in the home with program information, hours, etc. Both are appreciative of visit today.  HISTORY OF PRESENT ILLNESS:  This is a 87 yo female with a diagnosis of Alzheimer's dementia. She was recently discharged from hospice services on 03/15/21 for a greater than 6 month prognosis. Family was interested in receiving palliative care services for additional support.  CODE STATUS: DNR ADVANCED DIRECTIVES: Y MOST FORM: no PPS: 50%   PHYSICAL EXAM:   VITALS: T: 97.6, BP 125/64, R 18, P 60, O2 95% 2L  LUNGS:  slight expiratory wheeze in right upper lobe, respirations even, regular and unlabored CARDIAC: Cor RRR EXTREMITIES: No edema SKIN:  Thin,frail skin; no wounds    NEURO:  Alert and oriented x 3 (person/place/time), forgetful, some confusion at times, generalized weakness, ambulatory w/quad cane   (Duration of visit and documentation 60 minutes)   Daryl Eastern, RN BSN

## 2021-04-04 NOTE — Progress Notes (Signed)
COMMUNITY PALLIATIVE CARE SW NOTE  PATIENT NAME: Laurie Horne DOB: July 29, 1934 MRN: [id number]  PRIMARY CARE PROVIDER: Elizabeth Palau, FNP  RESPONSIBLE PARTY:  Acct ID - Guarantor Home Phone Work Phone Relationship Acct Type  [id number] Laurie Horne* [phone]  Self P/F     [address], [place], [place] [address]     PLAN OF CARE and INTERVENTIONS:             GOALS OF CARE/ ADVANCE CARE PLANNING:  Goal is for patient to remain in her home. Patient is a DNR. SOCIAL/EMOTIONAL/SPIRITUAL ASSESSMENT/ INTERVENTIONS:  SW and RN- Laurie Horne completed an initial palliative care visit with patient at her home where she was present with her PCG/granddaughter-Laurie Horne. Palliative care services, role in patient's care, visit frequency and contact information was provided to patient/PCG. Patient and PCG have verbal and written consent to ongoing services. Patient was recently discharged from [facility] hosp[facility] services due to greater than six month prognosis. Patient current status is she remains hard of hearing, takes nebulizer treatments 3 x/day most days, she is on 2 liters of continuous o2. She sporadically does her Trilogy treatments. Patient has intermittent pain to her toes and fingers. Patient ambulates with a cane inside the home and a wheelchair outside of the home. PCG request assistance with getting patient a wheelchair. Patient is hard of hearing and has vision issues. Patient has some mental/cognitive changes where she has confusion, unsteady gait and withdrawn when she does not do her nebulizer regularly. Patient eagerly and at times humorously participated in the visit. She repeated questions /statements several times. Patient's granddaughter and grandson-in-law are her primary caretakers and patient is left alone. SW provided support and education to the patient/family, assessment of need and comfort, and reassurance of support. PATIENT/CAREGIVER EDUCATION/ COPING:  Patient seemed to be in good  spirits and is coping well. She has a support family. PERSONAL EMERGENCY PLAN:  911 can be activated for emergencies.  COMMUNITY RESOURCES COORDINATION/ HEALTH CARE NAVIGATION:  PCG request assistance with getting patient a wheelchair.  FINANCIAL/LEGAL CONCERNS/INTERVENTIONS: None.     SOCIAL HX:  Social History   Tobacco Use   Smoking status: Former    Packs/day: 0.25    Pack years: 0.00    Types: Cigarettes   Smokeless tobacco: Never  Substance Use Topics   Alcohol use: No    CODE STATUS: DNR ADVANCED DIRECTIVES: Yes MOST FORM COMPLETE: N/A HOSPICE EDUCATION PROVIDED: None  PPS: Patient is alert and oriented x3, with intermittent confusion and forgetfulness. She ambulates with a cane. Her appetite is good. Patient is on continuous o2.   Duration of visit and documentation: 60 minutes   Laurie Llano, LCSW

## 2021-04-07 ENCOUNTER — Other Ambulatory Visit: Payer: Medicare Other

## 2021-04-07 ENCOUNTER — Telehealth: Payer: Self-pay

## 2021-04-07 NOTE — Telephone Encounter (Signed)
Received message that patient's son, Alycia Rossetti, called to report that he feels patient is impacted. Spoke with Merril Abbe NP who advised that if patient is indeed impacted then she should be sent to the ED for follow up. Also, directed son to give fleets enema and begin Miralax BID. VM left for son relaying information with call back number.

## 2021-04-08 NOTE — Progress Notes (Signed)
Palliative Care Social Work Encounter SW delivered a donated wheelchair to patient at her home. Pat/family was very Adult nurse.

## 2021-05-15 ENCOUNTER — Telehealth: Payer: Self-pay

## 2021-05-15 NOTE — Telephone Encounter (Signed)
Received message that patient has lost power at house and needs oxygen e-tanks. Phone call placed to [employer], spoke with Elnita Maxwell, who noted emergency tanks were sent out yesterday. Phone call placed to patient/family to check in. VM left.

## 2021-06-21 ENCOUNTER — Telehealth: Payer: Self-pay

## 2021-06-21 NOTE — Telephone Encounter (Signed)
Received message to call grand daughter, [place]. Patient had a fall with injury to her left arm and left leg. Per report, patient fell backward into her reclining chair. Since that incident, pain has worsened and patient is still not able to move her left arm no bear weight on her left leg. Phone call placed to granddaughter, message left with MD direction to go to Urgent care for X rays.Call back information provided

## 2021-06-21 NOTE — Telephone Encounter (Signed)
(  3:46 pm) SW attempted to return call to patient's granddaughter and was unable to reach her as she called in to report patient had a fall last night and swelling was noted in her wrist. SW then called patient's son-Shawn and advised him that the palliative care medical director recommended that patient be taken for an x-ray, as there is concerns of patient having swelling to her wrist from a fall last night. Shawn advised that they will take patient  for an x-ray.

## 2021-06-29 ENCOUNTER — Telehealth: Payer: Self-pay

## 2021-06-29 NOTE — Telephone Encounter (Signed)
(  2:43 pm) SW left a message for patient's granddaughter/PCG-[name] [phone]) requesting a call back to schedule a visit. SW also left a message on patient's home phone [phone]).

## 2021-08-24 DEATH — deceased
